# Patient Record
Sex: Male | Born: 1945 | Race: White | Hispanic: No | Marital: Married | Smoking: Former smoker
Health system: Southern US, Community
[De-identification: ages and names within clinical notes are randomized; demographics above are authoritative.]

## PROBLEM LIST (undated history)

## (undated) DIAGNOSIS — I10 Essential (primary) hypertension: Secondary | ICD-10-CM

## (undated) DIAGNOSIS — I471 Supraventricular tachycardia, unspecified: Secondary | ICD-10-CM

## (undated) DIAGNOSIS — E785 Hyperlipidemia, unspecified: Secondary | ICD-10-CM

---

## 2008-05-09 HISTORY — PX: CATARACT EXTRACTION, BILATERAL: SHX1313

## 2020-07-10 ENCOUNTER — Other Ambulatory Visit: Payer: Self-pay

## 2020-07-10 ENCOUNTER — Observation Stay
Admission: EM | Admit: 2020-07-10 | Discharge: 2020-07-11 | Disposition: A | Discharge status: Home / Self Care | Payer: Medicare HMO | Attending: Internal Medicine | Admitting: Internal Medicine

## 2020-07-10 ENCOUNTER — Encounter: Payer: Self-pay | Admitting: Internal Medicine

## 2020-07-10 ENCOUNTER — Observation Stay: Payer: Medicare HMO

## 2020-07-10 DIAGNOSIS — R55 Syncope and collapse: Principal | ICD-10-CM | POA: Insufficient documentation

## 2020-07-10 DIAGNOSIS — D62 Acute posthemorrhagic anemia: Secondary | ICD-10-CM | POA: Diagnosis not present

## 2020-07-10 DIAGNOSIS — Z79899 Other long term (current) drug therapy: Secondary | ICD-10-CM | POA: Insufficient documentation

## 2020-07-10 DIAGNOSIS — E785 Hyperlipidemia, unspecified: Secondary | ICD-10-CM | POA: Diagnosis not present

## 2020-07-10 DIAGNOSIS — K921 Melena: Secondary | ICD-10-CM

## 2020-07-10 DIAGNOSIS — K922 Gastrointestinal hemorrhage, unspecified: Secondary | ICD-10-CM | POA: Insufficient documentation

## 2020-07-10 DIAGNOSIS — D72829 Elevated white blood cell count, unspecified: Secondary | ICD-10-CM | POA: Diagnosis not present

## 2020-07-10 DIAGNOSIS — Z20822 Contact with and (suspected) exposure to covid-19: Secondary | ICD-10-CM | POA: Insufficient documentation

## 2020-07-10 DIAGNOSIS — Z87891 Personal history of nicotine dependence: Secondary | ICD-10-CM | POA: Diagnosis not present

## 2020-07-10 DIAGNOSIS — I10 Essential (primary) hypertension: Secondary | ICD-10-CM | POA: Diagnosis not present

## 2020-07-10 DIAGNOSIS — D696 Thrombocytopenia, unspecified: Secondary | ICD-10-CM

## 2020-07-10 HISTORY — DX: Essential (primary) hypertension: I10

## 2020-07-10 HISTORY — DX: Supraventricular tachycardia, unspecified: I47.10

## 2020-07-10 HISTORY — DX: Hyperlipidemia, unspecified: E78.5

## 2020-07-10 HISTORY — DX: Supraventricular tachycardia: I47.1

## 2020-07-10 LAB — COMPREHENSIVE METABOLIC PANEL
ALT: 17 U/L (ref 0–44)
AST: 14 U/L — ABNORMAL LOW (ref 15–41)
Albumin: 3.2 g/dL — ABNORMAL LOW (ref 3.5–5.0)
Alkaline Phosphatase: 46 U/L (ref 38–126)
Anion gap: 8 (ref 5–15)
BUN: 20 mg/dL (ref 8–23)
CO2: 21 mmol/L — ABNORMAL LOW (ref 22–32)
Calcium: 8.1 mg/dL — ABNORMAL LOW (ref 8.9–10.3)
Chloride: 110 mmol/L (ref 98–111)
Creatinine, Ser: 0.86 mg/dL (ref 0.61–1.24)
GFR, Estimated: >60 mL/min (ref >60)
Glucose, Bld: 176 mg/dL — ABNORMAL HIGH (ref 70–99)
Potassium: 4.1 mmol/L (ref 3.5–5.1)
Sodium: 139 mmol/L (ref 135–145)
Total Bilirubin: 0.7 mg/dL (ref 0.3–1.2)
Total Protein: 5.6 g/dL — ABNORMAL LOW (ref 6.5–8.1)

## 2020-07-10 LAB — CBC WITH DIFFERENTIAL/PLATELET
Abs Immature Granulocytes: 0.07 10*3/uL (ref 0.00–0.07)
Basophils Absolute: 0 10*3/uL (ref 0.0–0.1)
Basophils Relative: 0 %
Eosinophils Absolute: 0 10*3/uL (ref 0.0–0.5)
Eosinophils Relative: 0 %
HCT: 32.2 % — ABNORMAL LOW (ref 39.0–52.0)
Hemoglobin: 10.4 g/dL — ABNORMAL LOW (ref 13.0–17.0)
Immature Granulocytes: 1 %
Lymphocytes Relative: 14 %
Lymphs Abs: 1.8 10*3/uL (ref 0.7–4.0)
MCH: 32.7 pg (ref 26.0–34.0)
MCHC: 32.3 g/dL (ref 30.0–36.0)
MCV: 101.3 fL — ABNORMAL HIGH (ref 80.0–100.0)
Monocytes Absolute: 0.8 10*3/uL (ref 0.1–1.0)
Monocytes Relative: 6 %
Neutro Abs: 9.9 10*3/uL — ABNORMAL HIGH (ref 1.7–7.7)
Neutrophils Relative %: 79 %
Platelets: 160 10*3/uL (ref 150–400)
RBC: 3.18 MIL/uL — ABNORMAL LOW (ref 4.22–5.81)
RDW: 13 % (ref 11.5–15.5)
WBC: 12.6 10*3/uL — ABNORMAL HIGH (ref 4.0–10.5)
nRBC: 0 % (ref 0.0–0.2)

## 2020-07-10 LAB — MAGNESIUM: Magnesium: 1.8 mg/dL (ref 1.7–2.4)

## 2020-07-10 LAB — TYPE AND SCREEN
ABO/RH(D): O POS
Antibody Screen: NEGATIVE

## 2020-07-10 LAB — PROTIME-INR
INR: 1.2 (ref 0.8–1.2)
Prothrombin Time: 14.4 seconds (ref 11.4–15.2)

## 2020-07-10 LAB — TROPONIN I (HIGH SENSITIVITY)
Troponin I (High Sensitivity): 7 ng/L (ref <18)
Troponin I (High Sensitivity): 8 ng/L (ref <18)

## 2020-07-10 LAB — APTT: aPTT: 29 seconds (ref 24–36)

## 2020-07-10 MED ORDER — LACTATED RINGERS IV SOLN: INTRAVENOUS | Status: DC

## 2020-07-10 MED ORDER — ONDANSETRON HCL 4 MG/2ML IJ SOLN: 4.0000 mg | Freq: Four times a day (QID) | INTRAMUSCULAR | Status: DC | PRN

## 2020-07-10 MED ORDER — PEG 3350-KCL-NA BICARB-NACL 420 G PO SOLR
4000.0000 mL | Freq: Once | ORAL | Status: DC
  Filled 2020-07-10: qty 4000

## 2020-07-10 MED ORDER — IOHEXOL 300 MG/ML  SOLN
100.0000 mL | Freq: Once | INTRAMUSCULAR | Status: AC | PRN
  Administered 2020-07-10: 100 mL via INTRAVENOUS

## 2020-07-10 MED ORDER — LACTATED RINGERS IV BOLUS
1000.0000 mL | Freq: Once | INTRAVENOUS | Status: AC
  Administered 2020-07-10: 1000 mL via INTRAVENOUS

## 2020-07-10 NOTE — H&P (Signed)
History and Physical    PLEASE NOTE THAT DRAGON DICTATION SOFTWARE WAS USED IN THE CONSTRUCTION OF THIS NOTE.   Bobby Moreno WNI:[id number] DOB: [birth year] DOA: 07/10/2020  PCP: Zeb Comfort, MD Patient coming from: home   I have personally briefly reviewed patient's old medical records in [place]  Chief Complaint: Hematochezia  HPI: Bobby Moreno is a 75 y.o. male with medical history significant for hypertension, hyperlipidemia, who is admitted to [facility] on 07/10/2020 with acute lower gastrointestinal bleed after presenting from home to [facility] ED complaining of hematochezia.   The patient underwent colonoscopy with removal of several adenomatous polyps on 07/01/2020 via Dr. Leroy Sea, who is within the [facility] medical system.  During the colonoscopy, large polyp at the level of the cecum was observed, and the patient was subsequently scheduled for repeat colonoscopy with endoscopic mucosal resection of this polyp approximately 1 week later.  Correspondingly, he underwent repeat colonoscopy with endoscopic mucosal resection via Dr. Leroy Sea on 07/09/2020, during which portions but not all of the cecal polyp was removed at that time.  No intraoperative complications were reported, and Dr. Leafy Half to repeat colonoscopy in a few weeks to further address residual polyp at the level of the cecum.   Starting this morning, the patient has experienced 4-5 episodes of hematochezia associated with loose stool.  Denies any associated melena.  He reports mild abdominal tenderness in the bilateral lower abdominal quadrants, slightly worse with tenderness over this area.  He also reports intermittent nausea resulting in 1 episode of nonbloody, nonbilious emesis.  He conveys that his abdominal pain subsequently resolved, without any ensuing recurrence.  After the fourth episode of hematochezia, patient reports that he was sitting in his easy chair on the afternoon of 07/09/2020 at which  time he began to feel dizzy and lightheaded, and ultimately lost consciousness without falling out of his chair.  Episode was reportedly witnessed by the patient's wife, who was able to confirm loss of consciousness lasting less than 30 seconds and in the absence of any associated tonic-clonic activity, tongue biting, or loss of bowel/bladder function.  Not associate with any chest pain, shortness of breath, palpitations.  No preceding trauma.  Not associate with any acute focal weakness, acute focal numbness, paresthesias, dysarthria, facial droop, acute change in vision, dysphagia, or vertigo.   The patient denies taking any blood thinners at home, including no aspirin.  Denies any known underlying liver pathology.  Reports that he typically consumes 1 beer per day, but denies any significant alcohol consumption in excess of this quantity.  No routine use of NSAIDs, oral iron supplementation, oral corticosteroids, or oral potassium supplementation.  No prior history of gastrointestinal bleed.  In the setting of the aforementioned syncopal episode, EMS was contacted and brought the patient to [facility] ED for further evaluation.  The patient denies any recent subjective fever, chills, rigors, or generalized myalgias.  Denies any recent headache, neck stiffness, rhinitis, rhinorrhea, sore throat, wheezing, cough or rash.  No recent traveling or known COVID-19 exposures.  Denies any associated dysuria, gross hematuria, or change in urinary urgency/frequency.     ED Course:  Vital signs in the ED were notable for the following: Temperature max 98, heart rate 84-91; blood pressure 112/68 -122/81; respiratory rate 18-21, oxygen saturation 97 to 99% on room air.  Labs were notable for the following: CMP was notable for the following: Sodium 139, bicarbonate 21, anion gap 8, BUN 20, creatinine 0.86, glucose 176.  CBC  notable for the following: White blood cell count 12,600, hemoglobin 10 with MCV 101,  normochromic findings, and RDW 13, which is relative to the following hemoglobin values identified per review of Care Everywhere: July 2020: Hemoglobin 14, May 2021: Hemoglobin 13.3.  Today's CBC is also notable for platelet count of 160.  INR 1.2.  High-sensitivity troponin I initially noted to be 7, with repeat value found to be 8.  Nasopharyngeal COVID-19 PCR was checked in the ED today, with result currently pending.  EKG showed sinus rhythm with PAC, heart rate 88, normal intervals, and no evidence of T wave or ST changes, including no evidence of ST elevation.  The patient's case was discussed with the on-call gastroenterologist, Dr. Bonna Gains, who recommended stat CT abdomen/pelvis with IV contrast to evaluate for bowel perforation, with plan to initiate Nulytely bowel prep if CT abdomen/pelvis showed no evidence to suggest bowel perforation. Dr. Bonna Gains has ordered colonoscopy to occur in the morning, and requested that Nulytely bowel prep be complete by 4 AM in order to be compliant with the scheduled timeframe for colonoscopy.   While in the ED, the following were administered: LR bolus x 1 L.       Review of Systems: As per HPI otherwise 10 point review of systems negative.   Past Medical History:  Diagnosis Date  . HLD (hyperlipidemia)   . HTN (hypertension)   . PSVT (paroxysmal supraventricular tachycardia) ([place])     Past Surgical History:  Procedure Laterality Date  . CATARACT EXTRACTION, BILATERAL  2010    Social History:  reports that he has quit smoking. He has never used smokeless tobacco. He reports current alcohol use of about 7.0 standard drinks of alcohol per week. He reports that he does not use drugs.   No Known Allergies  Family History  Problem Relation Age of Onset  . Dementia Mother   . Prostate cancer Father   . Emphysema Father      Home Medications: Outpatient medications include Toprol-XL, atorvastatin, oral vitamin B12, as needed oxycodone,  and perindopril.     Objective    Physical Exam: Vitals:   07/10/20 1910 07/10/20 1911 07/10/20 1930 07/10/20 2030  BP: 113/66  112/68 122/81  Pulse: 84  91 90  Resp: 18  18 (!) 21  Temp: 98 F (36.7 C)     TempSrc: Oral     SpO2: 96%  97% 99%  Weight:  81.6 kg    Height:  5\' 9"  (1.753 m)      General: appears to be stated age; alert, oriented Skin: warm, dry, no rash Head:  AT/[place] Mouth:  Oral mucosa membranes appear dry, normal dentition Neck: supple; trachea midline Heart:  RRR; did not appreciate any M/R/G Lungs: CTAB, did not appreciate any wheezes, rales, or rhonchi Abdomen: + BS; soft, ND, NT Vascular: 2+ pedal pulses b/l; 2+ radial pulses b/l Extremities: no peripheral edema, no muscle wasting Neuro: strength and sensation intact in upper and lower extremities b/l    Labs on Admission: I have personally reviewed following labs and imaging studies  CBC: Recent Labs  Lab 07/10/20 1929  WBC 12.6*  NEUTROABS 9.9*  HGB 10.4*  HCT 32.2*  MCV 101.3*  PLT 073   Basic Metabolic Panel: Recent Labs  Lab 07/10/20 1929  NA 139  K 4.1  CL 110  CO2 21*  GLUCOSE 176*  BUN 20  CREATININE 0.86  CALCIUM 8.1*   GFR: Estimated Creatinine Clearance: 77.6 mL/min (by C-G  formula based on SCr of 0.86 mg/dL). Liver Function Tests: Recent Labs  Lab 07/10/20 1929  AST 14*  ALT 17  ALKPHOS 46  BILITOT 0.7  PROT 5.6*  ALBUMIN 3.2*   No results for input(s): LIPASE, AMYLASE in the last 168 hours. No results for input(s): AMMONIA in the last 168 hours. Coagulation Profile: Recent Labs  Lab 07/10/20 1929  INR 1.2   Cardiac Enzymes: No results for input(s): CKTOTAL, CKMB, CKMBINDEX, TROPONINI in the last 168 hours. BNP (last 3 results) No results for input(s): PROBNP in the last 8760 hours. HbA1C: No results for input(s): HGBA1C in the last 72 hours. CBG: No results for input(s): GLUCAP in the last 168 hours. Lipid Profile: No results for input(s):  CHOL, HDL, LDLCALC, TRIG, CHOLHDL, LDLDIRECT in the last 72 hours. Thyroid Function Tests: No results for input(s): TSH, T4TOTAL, FREET4, T3FREE, THYROIDAB in the last 72 hours. Anemia Panel: No results for input(s): VITAMINB12, FOLATE, FERRITIN, TIBC, IRON, RETICCTPCT in the last 72 hours. Urine analysis: No results found for: COLORURINE, APPEARANCEUR, LABSPEC, PHURINE, GLUCOSEU, HGBUR, BILIRUBINUR, KETONESUR, PROTEINUR, UROBILINOGEN, NITRITE, LEUKOCYTESUR  Radiological Exams on Admission: No results found.   EKG: Independently reviewed, with result as described above.    Assessment/Plan   Bobby Moreno is a 75 y.o. male with medical history significant for hypertension, hyperlipidemia, who is admitted to [facility] on 07/10/2020 with acute lower gastrointestinal bleed after presenting from home to [facility] ED complaining of hematochezia.   Principal Problem:   Acute lower GI bleeding Active Problems:   Acute blood loss anemia   HTN (hypertension)   HLD (hyperlipidemia)   Syncope   Leukocytosis    #) Acute lower GI bleed: diagnosis on the basis of 4-5 episodes of hematochezia over the course of the last day, with most recent such episode occurring approximately 1 hour prior to presenting to [facility] ED.  This is in the context of colonoscopy with endoscopic mucosal resection of the portion of a large cecal polyp that was performed by Dr. Leroy Sea within the [facility] system yesterday, raising possibility of periprocedural contributions to presenting acute lower GI bleed. A non-elevated presenting BUN further substantiates suspicion of a lower GI source. Patient appears hemodynamically stable with normotensive/non-tachycardic vital signs thus far, although he was noted with a single episode of syncope at home earlier today.  Not on any blood thinners as an outpatient, including no aspirin.  Reports typical consumption of 1 beer per day, but denies any history of known underlying  liver disease to warrant SBP prophylaxis.  Presentation appears to be associated with acute blood loss anemia, as further described below, with presenting Hgb of 10.4 relative to baseline hemoglobin of 13-14 per review of [facility] records, although this range was established approximately 8 months ago without interval hgb data points.  INR noted to be 1.2.  Given suspected lower GI source, there does not appear to be indication for initiation of Protonix drip.  The patient was typed and screened in the ED.   The patient's case was discussed with the on-call gastroenterologist, Dr. Bonna Gains, who recommended stat CT abdomen/pelvis with IV contrast to evaluate for bowel perforation, with plan to initiate Nulytely bowel prep if CT abdomen/pelvis showed no evidence to suggest bowel perforation. Dr. Bonna Gains has ordered colonoscopy to occur in the morning, and requested that Nulytely bowel prep be complete by 4 AM in order to be compliant with the scheduled timeframe for colonoscopy.      Plan: NPO. Refraining  from pharmacologic DVT prophylaxis. scd's Monitor on telemetry. Monitor continuous pulse-ox. Maintain at least 2 large bore IV's. Check INR in the AM. Q4H H&H's have been ordered through 9 AM on 07/11/20. Will closely monitor these ensuing Hgb levels and correlate these data points with the patient's overall clinical picture including vital signs to determine need for prbc transfusion.  Repeat CBC with morning labs.  Stat CT abdomen/pelvis with IV contrast for evaluation of bowel perforation, as above, with plan to initiate Nulytely bowel prep should CT abdomen/pelvis demonstrate evidence of bowel perforation.  This plan was communicated to the patient's nurse, along with the need to complete the bowel prep by 4 AM.  Repeat CMP in the morning.     #) Acute blood loss anemia: in the setting of suspected acute lower GI bleed, as above, presenting Hgb noted to be 10.4 relative to baseline hemoglobin of 13-14 per  review of [facility] records, although this range was established approximately 8 months ago without interval hgb data points to establish a more accurate recent trend. At this time, patient appears hemodynamically stable and asymptomatic, as further described above.  Presenting hemoglobin was associated with macrocytic in hypochromic findings.  Interestingly, RDW found to be nonelevated, with this lack of elevation potentially on the basis of insufficient elapsed time following initial episode of hematochezia on the morning of  07/11/2018 .  As above, the patient has been typed and screened.    Plan: work-up and management for presenting suspected acute lower GI bleed, as above, including close monitoring of Q4H H&H's, with clinical evaluation for determination of need for blood transfusion, as further described above. Monitor on telemetry. Monitor continuous pulse-ox. NPO. Refraining from pharmacologic DVT prophylaxis. Check INR in the morning.  Gastroenterology formally consulted, with plan to proceed with colonoscopy in the morning following interval Nulytely bowel prep so long as CT abdomen/pelvis with IV contrast does not demonstrate evidence of bowel perforation, as further detailed above.  Add on the following to pretransfusion labs collected in the ED today: Total iron, TIBC, ferritin, MMA, folic acid level, reticulocyte count.         #) Syncope: 1 episodes of syncope earlier today in the setting of prodrome, with differential favoring orthostatic hypotension in the setting of dehydration due to acute lower gastrointestinal bleed versus contribution from vasovagal response as a result of associated mild abdominal discomfort.  Not associate with any overt acute focal neurologic deficits.  Clinically, acute ischemic stroke versus seizures previously this time.  Presentation was consistent with ACS, serial troponin found to be nonelevated, with presenting EKG shows no evidence of acute CVA changes.  In  the setting of acute blood loss anemia, will refrain from the atypical checking of orthostatic vital signs so as to not induce another syncopal episode.  There may also be a secondary pharmacologic factor contributing to patient's syncopal episode in the context of home beta-blocker and ACE inhibitor with associated inhibition of compensatory tachycardia and peripheral vasoconstriction in the setting of intravascular depletion and associated orthostatis.      Plan: Initiate gentle IV fluids in the form of lactated Ringer's at 50 cc/h.  Monitor on telemetry.  Hold on beta-blocker and ACE inhibitor for now.  Monitor strict I's and O's and daily weights.  Evaluation and management of presenting acute lower gastrointestinal bleed, including serial H&H trending as well as plan for colonoscopy in the morning, as further detailed above.  Repeat serum troponin level in the morning, although ACS clinically appears  much less likely relative to the above possibilities.      #) Leukocytosis: Presenting CBC reflects mild leukocytosis of 12,600.  Suspect potential multifactorial contributions from inflammatory element in the context of yesterday's colonoscopy with mucosal resection versus contribution from hemoconcentration in the context of intravascular depletion due to interval episodes of hematochezia and associated acute blood loss anemia, as further detailed above.  Overall, no evidence to suggest underlying infectious process at this time.  Of note, screening nasopharyngeal COVID-19 PCR was performed today, with result currently pending.  Also check urinalysis to further evaluate for any underlying infectious process.  Plan: Follow-up result of screening nasopharyngeal COVID-19 PCR performed today.  Check urinalysis P repeat CBC with differential in the morning.      #) Hyperlipidemia: Per review of [facility] records within Care Everywhere, the patient is on daily atorvastatin at home.  Plan: We will hold  home atorvastatin for now in the setting of current n.p.o. status.     #) Essential hypertension: Outpatient antihypertensive regimen, per review of [facility] records via Care Everywhere, consists of Toprol-XL as well as ACE inhibitor.  In the setting of presenting acute lower gastrointestinal bleed and ensuing acute blood loss anemia, will hold home beta-blocker and ACE inhibitor for now.  Plan: Hold home beta-blocker and ACE inhibitor for now.  Further evaluation management presenting acute lower gastrointestinal bleed and associated acute blood loss anemia, as further detailed above.  Close monitoring of ensuing blood pressure via routine vital signs.     DVT prophylaxis: scd's  Code Status: Full code Family Communication: I discussed the patient's case with his wife, who was present at bedside. Disposition Plan: Per Rounding Team Consults called: Case discussed with the on-call gastroenterologist, Dr. Bonna Gains, as further described above. Admission status: Observation, med telemetry.     Of note, this patient was added by me to the following Admit List/Treatment Team: armcadmits.      PLEASE NOTE THAT DRAGON DICTATION SOFTWARE WAS USED IN THE CONSTRUCTION OF THIS NOTE.   [facility] Pager [phone] From 12PM - 12AM  Otherwise, please contact night-coverage  [email or web address] Password [facility]   07/10/2020, 11:17 PM

## 2020-07-10 NOTE — ED Provider Notes (Signed)
[facility] Emergency Department Provider Note ____________________________________________   Event Date/Time   First MD Initiated Contact with Patient 07/10/20 1908     (approximate)  I have reviewed the triage vital signs and the nursing notes.  HISTORY  Chief Complaint Loss of Consciousness   HPI Bobby Moreno is a 75 y.o. malewho presents to the ED for evaluation of syncope and hematochezia.   Chart review indicates no hx within our system.  Patient reports that all of his medical care is through [place].  Reports history of HTN, HLD.  Takes no blood thinners and no cardiac history.  Recently moved to the [place] from [place] about 6 years ago.  Had his first colonoscopy 2 weeks ago, noted to have about 10 polyps, they removed 8 and he followed up again yesterday for second colonoscopy to remove what he describes as a large and out of reach polyp.  Patient reports a prolonged procedure yesterday morning and the GI specialist had difficulty successfully removing either of these 2 remaining polyps.  He reports being told he would have to follow-up again for another attempt.    Patient reports feeling generalized weakness yesterday, some nausea and poor p.o. intake.  Starting this morning, patient reports about 4-5 episodes of hematochezia.  He reports a sensation of needing to stool, having loose and watery stools with associated bright red blood without melena.  Reports a single episode of emesis today that was nonbloody nonbilious.  Patient reports a syncopal episode that occurred while seated today while he was listening to music, reports preceding lightheaded dizziness and diaphoresis.  Aside from the sensation of needing to stool, he denies any significant abdominal pain, chest pain, fever, dysuria.   History reviewed. No pertinent past medical history.  Patient Active Problem List   Diagnosis Date Noted  . Acute lower GI bleeding 07/10/2020    Prior to  Admission medications   Not on File    Allergies Patient has no known allergies.  No family history on file.  Social History    Review of Systems  Constitutional: No fever/chills Eyes: No visual changes. ENT: No sore throat. Cardiovascular: Denies chest pain. Respiratory: Denies shortness of breath. Gastrointestinal: No abdominal pain.  No constipation. Positive for nausea, emesis, hematochezia and diarrhea. Genitourinary: Negative for dysuria. Musculoskeletal: Negative for back pain. Skin: Negative for rash. Neurological: Negative for headaches, focal weakness or numbness.  Positive for syncopal episode.  ____________________________________________   PHYSICAL EXAM:  VITAL SIGNS: Vitals:   07/10/20 1930 07/10/20 2030  BP: 112/68 122/81  Pulse: 91 90  Resp: 18 (!) 21  Temp:    SpO2: 97% 99%     Constitutional: Alert and oriented. Well appearing and in no acute distress. Eyes: Conjunctivae are normal. PERRL. EOMI. Head: Atraumatic. Nose: No congestion/rhinnorhea. Mouth/Throat: Mucous membranes are moist.  Oropharynx non-erythematous. Neck: No stridor. No cervical spine tenderness to palpation. Cardiovascular: Normal rate, regular rhythm. Grossly normal heart sounds.  Good peripheral circulation. Respiratory: Normal respiratory effort.  No retractions. Lungs CTAB. Gastrointestinal: Soft , nondistended. No CVA tenderness. Mild and poorly localizing lower quadrant tenderness to palpation, primarily to the RLQ.  No peritoneal features.  Benign upper abdomen. Musculoskeletal: No lower extremity tenderness nor edema.  No joint effusions. No signs of acute trauma. Neurologic:  Normal speech and language. No gross focal neurologic deficits are appreciated. No gait instability noted. Skin:  Skin is warm, dry and intact. No rash noted. Psychiatric: Mood and affect are normal. Speech and  behavior are normal.  ____________________________________________   LABS (all labs  ordered are listed, but only abnormal results are displayed)  Labs Reviewed  COMPREHENSIVE METABOLIC PANEL - Abnormal; Notable for the following components:      Result Value   CO2 21 (*)    Glucose, Bld 176 (*)    Calcium 8.1 (*)    Total Protein 5.6 (*)    Albumin 3.2 (*)    AST 14 (*)    All other components within normal limits  CBC WITH DIFFERENTIAL/PLATELET - Abnormal; Notable for the following components:   WBC 12.6 (*)    RBC 3.18 (*)    Hemoglobin 10.4 (*)    HCT 32.2 (*)    MCV 101.3 (*)    Neutro Abs 9.9 (*)    All other components within normal limits  SARS CORONAVIRUS 2 (TAT 6-24 HRS)  PROTIME-INR  APTT  TYPE AND SCREEN  TYPE AND SCREEN  TYPE AND SCREEN  TROPONIN I (HIGH SENSITIVITY)  TROPONIN I (HIGH SENSITIVITY)   ____________________________________________  12 Lead EKG  Sinus rhythm, rate of 88 bpm.  Normal axis and intervals.  No evidence of acute ischemia.  Single PAC. ____________________________________________  RADIOLOGY  ED MD interpretation:    Official radiology report(s): No results found.  ____________________________________________   PROCEDURES and INTERVENTIONS  Procedure(s) performed (including Critical Care):  .1-3 Lead EKG Interpretation Performed by: Vladimir Crofts, MD Authorized by: Vladimir Crofts, MD     Interpretation: normal     ECG rate:  88   ECG rate assessment: normal     Rhythm: sinus rhythm     Ectopy: none     Conduction: normal      Medications  polyethylene glycol-electrolytes (NuLYTELY) solution 4,000 mL (has no administration in time range)  lactated ringers bolus 1,000 mL (0 mLs Intravenous Stopped 07/10/20 2250)  iohexol (OMNIPAQUE) 300 MG/ML solution 100 mL (100 mLs Intravenous Contrast Given 07/10/20 2327)    ____________________________________________   MDM / ED COURSE   75 year old male s/p polypectomy presents to the ED with evidence of postoperative bleeding requiring admission for repeat  colonoscopy.  Normal vitals here in the ED.  Exam with minimal RLQ tenderness to palpation without peritoneal features, and he otherwise looks well.  He has no neurovascular deficits, signs of trauma or any distress.  Blood work demonstrates a hemoglobin of 10 without recent comparison, and likely a slight drop.  His EKG is nonischemic without evidence of interval changes to suggest a cardiac etiology of syncope.  He remains normotensive here in the ED without vital sign abnormalities.  I discussed the case with GI, who initially recommends a nuclear study to assess for GI bleeding, but it does not look like we can do this study overnight and she therefore follows up with recommendation for CT which is pending at time of admission to medicine.  Plans to perform colonoscopy in the morning to assess for continued bleeding from this large polyp.  Admitted to medicine for further work-up and management.  Clinical Course as of 07/10/20 2328  Ludwig Clarks Jul 10, 2020  2003 Care everywhere now links up and I can see some notes from [place].  I review procedure note from colonoscopy yesterday.  1 polyp in the cecum that is greater than 5 cm in size had an incomplete resection.  Additional large polyp in the ascending colon without resection attempt.  He was advised to have a repeat colonoscopy in 3 months for continued piecemeal polypectomy. [DS]  2011 I speak with Dr. Bonna Gains, and review workup and presentation.  RBC scan, if positive call vascular.  If negative, prep for colonoscopy in the AM [DS]  2039 Educated patient, and wife who is now at the bedside, of these developments and need for nuclear imaging.  We discussed admission to the hospital regardless of these results. [DS]  2252 Dr. Bonna Gains now messaging the RN and I to perform a CT instead of NM study. She plans to perform colonoscopy tomorrow AM [DS]  2327 I discussed the case with hospitalist medicine and updated them on my conversations with GI.  We  discussed the pending CT abdomen/pelvis, and Dr. Velia Meyer agrees to follow-up in the study [DS]    Clinical Course User Index [DS] Vladimir Crofts, MD    ____________________________________________   FINAL CLINICAL IMPRESSION(S) / ED DIAGNOSES  Final diagnoses:  Hematochezia  Syncope and collapse     ED Discharge Orders    None       Jalie Eiland Tamala Julian   Note:  This document was prepared using Dragon voice recognition software and may include unintentional dictation errors.   Vladimir Crofts, MD 07/10/20 [phone]

## 2020-07-10 NOTE — ED Triage Notes (Signed)
EMS called to house for dizziness. Pt had syncopal episode witness by EMS. Pt reports recent colonoscopy, reports seeing increasing blood in stool today.

## 2020-07-11 ENCOUNTER — Encounter
Admission: EM | Disposition: A | Discharge status: Home / Self Care | Payer: Self-pay | Source: Home / Self Care | Attending: Emergency Medicine

## 2020-07-11 DIAGNOSIS — R55 Syncope and collapse: Secondary | ICD-10-CM | POA: Diagnosis present

## 2020-07-11 DIAGNOSIS — D696 Thrombocytopenia, unspecified: Secondary | ICD-10-CM

## 2020-07-11 DIAGNOSIS — D72829 Elevated white blood cell count, unspecified: Secondary | ICD-10-CM | POA: Diagnosis not present

## 2020-07-11 DIAGNOSIS — E785 Hyperlipidemia, unspecified: Secondary | ICD-10-CM | POA: Diagnosis present

## 2020-07-11 DIAGNOSIS — K921 Melena: Secondary | ICD-10-CM

## 2020-07-11 DIAGNOSIS — D62 Acute posthemorrhagic anemia: Secondary | ICD-10-CM | POA: Diagnosis not present

## 2020-07-11 DIAGNOSIS — K561 Intussusception: Secondary | ICD-10-CM | POA: Diagnosis not present

## 2020-07-11 DIAGNOSIS — I10 Essential (primary) hypertension: Secondary | ICD-10-CM | POA: Diagnosis present

## 2020-07-11 LAB — URINALYSIS, ROUTINE W REFLEX MICROSCOPIC
Bilirubin Urine: NEGATIVE
Glucose, UA: NEGATIVE mg/dL
Hgb urine dipstick: NEGATIVE
Ketones, ur: 5 mg/dL — AB
Leukocytes,Ua: NEGATIVE
Nitrite: NEGATIVE
Protein, ur: NEGATIVE mg/dL
Specific Gravity, Urine: >1.046 — ABNORMAL HIGH (ref 1.005–1.030)
pH: 5 (ref 5.0–8.0)

## 2020-07-11 LAB — HEMOGLOBIN AND HEMATOCRIT, BLOOD
HCT: 29.1 % — ABNORMAL LOW (ref 39.0–52.0)
HCT: 29.1 % — ABNORMAL LOW (ref 39.0–52.0)
Hemoglobin: 9.5 g/dL — ABNORMAL LOW (ref 13.0–17.0)
Hemoglobin: 9.8 g/dL — ABNORMAL LOW (ref 13.0–17.0)

## 2020-07-11 LAB — IRON AND TIBC
Iron: 15 ug/dL — ABNORMAL LOW (ref 45–182)
Iron: 6 ug/dL — ABNORMAL LOW (ref 45–182)
Saturation Ratios: 6 % — ABNORMAL LOW (ref 17.9–39.5)
TIBC: 255 ug/dL (ref 250–450)
UIBC: 240 ug/dL

## 2020-07-11 LAB — COMPREHENSIVE METABOLIC PANEL
ALT: 14 U/L (ref 0–44)
AST: 12 U/L — ABNORMAL LOW (ref 15–41)
Albumin: 3.1 g/dL — ABNORMAL LOW (ref 3.5–5.0)
Alkaline Phosphatase: 41 U/L (ref 38–126)
Anion gap: 5 (ref 5–15)
BUN: 16 mg/dL (ref 8–23)
CO2: 26 mmol/L (ref 22–32)
Calcium: 8.1 mg/dL — ABNORMAL LOW (ref 8.9–10.3)
Chloride: 110 mmol/L (ref 98–111)
Creatinine, Ser: 0.7 mg/dL (ref 0.61–1.24)
GFR, Estimated: >60 mL/min (ref >60)
Glucose, Bld: 119 mg/dL — ABNORMAL HIGH (ref 70–99)
Potassium: 4.3 mmol/L (ref 3.5–5.1)
Sodium: 141 mmol/L (ref 135–145)
Total Bilirubin: 0.9 mg/dL (ref 0.3–1.2)
Total Protein: 5.6 g/dL — ABNORMAL LOW (ref 6.5–8.1)

## 2020-07-11 LAB — CBC
HCT: 29 % — ABNORMAL LOW (ref 39.0–52.0)
Hemoglobin: 9.9 g/dL — ABNORMAL LOW (ref 13.0–17.0)
MCH: 33.8 pg (ref 26.0–34.0)
MCHC: 34.1 g/dL (ref 30.0–36.0)
MCV: 99 fL (ref 80.0–100.0)
Platelets: 146 10*3/uL — ABNORMAL LOW (ref 150–400)
RBC: 2.93 MIL/uL — ABNORMAL LOW (ref 4.22–5.81)
RDW: 12.9 % (ref 11.5–15.5)
WBC: 7.9 10*3/uL (ref 4.0–10.5)
nRBC: 0.3 % — ABNORMAL HIGH (ref 0.0–0.2)

## 2020-07-11 LAB — FERRITIN
Ferritin: 66 ng/mL (ref 24–336)
Ferritin: 59 ng/mL (ref 24–336)

## 2020-07-11 LAB — PHOSPHORUS: Phosphorus: 3.2 mg/dL (ref 2.5–4.6)

## 2020-07-11 LAB — FOLATE
Folate: 12 ng/mL (ref >5.9)
Folate: 14.5 ng/mL (ref >5.9)

## 2020-07-11 LAB — RETICULOCYTES
Immature Retic Fract: 10.4 % (ref 2.3–15.9)
RBC.: 3.12 MIL/uL — ABNORMAL LOW (ref 4.22–5.81)
Retic Count, Absolute: 46.8 10*3/uL (ref 19.0–186.0)
Retic Ct Pct: 1.5 % (ref 0.4–3.1)

## 2020-07-11 LAB — PROTIME-INR
INR: 1.1 (ref 0.8–1.2)
Prothrombin Time: 14.2 seconds (ref 11.4–15.2)

## 2020-07-11 LAB — MAGNESIUM: Magnesium: 1.9 mg/dL (ref 1.7–2.4)

## 2020-07-11 LAB — TROPONIN I (HIGH SENSITIVITY): Troponin I (High Sensitivity): 8 ng/L (ref <18)

## 2020-07-11 LAB — SARS CORONAVIRUS 2 (TAT 6-24 HRS): SARS Coronavirus 2: NEGATIVE

## 2020-07-11 SURGERY — COLONOSCOPY
Anesthesia: General

## 2020-07-11 NOTE — Consult Note (Signed)
Bobby Antigua, MD [address] [place], [place], [place], [address] [address], [place], [place], [place], [address] Phone: [phone]  Fax: [phone]  Consultation  Referring Provider:     Dr. Vladimir Crofts Primary Care Physician:  Zeb Comfort, MD Reason for Consultation:     Hematochezia  Date of Admission:  07/10/2020 Date of Consultation:  07/11/2020         HPI:   Bobby Moreno is a 75 y.o. male who underwent colonoscopy at [facility] 2 days ago, 07/09/2020, with a large cecal polyp partially removed via EMR, and another large ascending colon polyp not removed.  Patient states he did not feel well after the procedure and had low appetite and felt dehydrated.  The day after the procedure, he reports having 4-5 episodes of hematochezia and dizziness.  Last episode of this was 5 PM yesterday evening.  He came into the ER due to the symptoms.  Patient denies any nausea vomiting with these symptoms.  Patient has not had any bowel movement since presentation to the ER yesterday evening.  Given his abdominal pain that the ER provider noted on presentation and his symptoms, and recent colonoscopy, I recommended CT scan for further evaluation, which showed colocolonic intussusception.  Past Medical History:  Diagnosis Date  . HLD (hyperlipidemia)   . HTN (hypertension)   . PSVT (paroxysmal supraventricular tachycardia) ([place])     Past Surgical History:  Procedure Laterality Date  . CATARACT EXTRACTION, BILATERAL  2010    Prior to Admission medications   Not on File    Family History  Problem Relation Age of Onset  . Dementia Mother   . Prostate cancer Father   . Emphysema Father      Social History   Tobacco Use  . Smoking status: Former Research scientist (life sciences)  . Smokeless tobacco: Never Used  Substance Use Topics  . Alcohol use: Yes    Alcohol/week: 7.0 standard drinks    Types: 7 Cans of beer per week  . Drug use: Never    Allergies as of 07/10/2020  . (No Known  Allergies)    Review of Systems:    All systems reviewed and negative except where noted in HPI.   Physical Exam:  Vital signs in last 24 hours: Vitals:   07/11/20 0130 07/11/20 0200 07/11/20 0228 07/11/20 0726  BP: 119/82 139/90 131/90 120/71  Pulse: 91 98 87 70  Resp: 20 (!) 23 18 20   Temp:   98.2 F (36.8 C) 97.8 F (36.6 C)  TempSrc:   Oral Oral  SpO2: 95% 97% 98% 98%  Weight:   82.8 kg   Height:   5\' 9"  (1.753 m)    Last BM Date: 07/10/20 General:   Pleasant, cooperative in NAD Head:  Normocephalic and atraumatic. Eyes:   No icterus.   Conjunctiva pink. PERRLA. Ears:  Normal auditory acuity. Neck:  Supple; no masses or thyroidomegaly Lungs: Respirations even and unlabored. Lungs clear to auscultation bilaterally.   No wheezes, crackles, or rhonchi.  Abdomen:  Soft, nondistended, nontender. Normal bowel sounds. No appreciable masses or hepatomegaly.  No rebound or guarding.  Neurologic:  Alert and oriented x3;  grossly normal neurologically. Skin:  Intact without significant lesions or rashes. Cervical Nodes:  No significant cervical adenopathy. Psych:  Alert and cooperative. Normal affect.  LAB RESULTS: Recent Labs    07/10/20 1929 07/11/20 0038 07/11/20 0558 07/11/20 0833  WBC 12.6*  --  7.9  --   HGB  10.4* 9.8* 9.9* 9.5*  HCT 32.2* 29.1* 29.0* 29.1*  PLT 160  --  146*  --    BMET Recent Labs    07/10/20 1929 07/11/20 0558  NA 139 141  K 4.1 4.3  CL 110 110  CO2 21* 26  GLUCOSE 176* 119*  BUN 20 16  CREATININE 0.86 0.70  CALCIUM 8.1* 8.1*   LFT Recent Labs    07/11/20 0558  PROT 5.6*  ALBUMIN 3.1*  AST 12*  ALT 14  ALKPHOS 41  BILITOT 0.9   PT/INR Recent Labs    07/10/20 1929 07/11/20 0558  LABPROT 14.4 14.2  INR 1.2 1.1    STUDIES: CT ABDOMEN PELVIS W CONTRAST  Result Date: 07/10/2020 CLINICAL DATA:  Abdominal pain, recent cecal polypectomy, dizziness EXAM: CT ABDOMEN AND PELVIS WITH CONTRAST TECHNIQUE: Multidetector CT imaging  of the abdomen and pelvis was performed using the standard protocol following bolus administration of intravenous contrast. CONTRAST:  121mL OMNIPAQUE IOHEXOL 300 MG/ML  SOLN COMPARISON:  None. FINDINGS: Lower chest: No acute pleural or parenchymal lung disease. Hepatobiliary: Multiple hepatic cysts. No biliary dilation. The gallbladder is unremarkable. Pancreas: Unremarkable. No pancreatic ductal dilatation or surrounding inflammatory changes. Spleen: Normal in size without focal abnormality. Adrenals/Urinary Tract: There are multiple cortical calcifications left kidney with overlying scarring. Punctate less than 2 mm nonobstructing right renal calculi. No obstructive uropathy within either kidney. The adrenals and bladder are unremarkable. Stomach/Bowel: No bowel obstruction or ileus. Normal appendix right lower quadrant. There is marked wall thickening of the cecum, which could be related to recent cecal polypectomy. Small foci of intramural gas best seen on images 51-53 of series 2 are likely related to recent polypectomy. There is a short segment intussusception involving the proximal ascending colon, best seen on coronal image 43 of series 5. No evidence of bowel perforation, fluid collection, or abscess. Vascular/Lymphatic: Aortic atherosclerosis. No enlarged abdominal or pelvic lymph nodes. Reproductive: Prostate is unremarkable. Other: There is mesenteric edema surrounding the cecum, without free fluid or free gas within the peritoneal cavity. No abdominal wall hernia. Musculoskeletal: Chronic L2 compression fracture. No acute bony abnormalities. Reconstructed images demonstrate no additional findings. IMPRESSION: 1. Marked wall thickening of the cecum and proximal ascending colon, with focal area of intramural gas in the cecum likely related to recent polypectomy. No evidence of perforation. 2. Short segment colo-colic intussusception involving the proximal ascending colon. No evidence of obstruction. 3.  Punctate nonobstructing right renal calculi. 4. Left renal cortical scarring with cortical calcifications. 5.  Aortic Atherosclerosis (ICD10-I70.0). Electronically Signed   By: Randa Ngo M.D.   On: 07/10/2020 23:47      Impression / Plan:   Majour Frei is a 75 y.o. y/o male with presentation for hematochezia post colonoscopy with EMR for removal of large cecal polyp the day prior to that   Patient is hematochezia has completely resolved and he has not had a bowel movement over 16 hours He is hemodynamically stable His abdominal pain and discomfort have completely resolved as well since receiving IV fluids in the ER  Patient may have had a post polypectomy bleed given that a large polyp, 50 mm in size, was partially removed during that procedure  The bleeding appears to have stopped at this time, and given his colocolonic intussusception noted on CT scan last night, would not recommend colonoscopy at this time unless rebleeding occurs  Surgery has evaluated the patient in regard to his intussusception recommended conservative management  Would recommend close  monitoring with repeat CBC as necessary  Colocolonic intussusception likely occurred due to presence of another large polyp in the ascending colon that was unable to be removed during the colonoscopy and is planning on being removed in June at [facility]  If patient remains asymptomatic with no recurrent bleeding or worsening anemia during this admission, patient should have close follow-up scheduled with his primary gastroenterologist at [facility], within 1 to 2 weeks of discharge  will continue to follow   Thank you for involving me in the care of this patient.      LOS: 0 days   Virgel Manifold, MD  07/11/2020, 9:48 AM

## 2020-07-11 NOTE — Care Plan (Signed)
Pt given AVS at this time, IV removed. Pt denies questions. Pt educated about following up with PCP.

## 2020-07-11 NOTE — Progress Notes (Signed)
Update: CT abdomen/pelvis with IV contrast has been completed with result showing no evidence of perforation while showing short segment of colo-coli intussusception involving the proximal ascending colon in absence of associated evidence of obstruction.  The patient has experienced no episodes of hematochezia since presenting to the ED and is currently asymptomatic while appearing hemodynamically stable.  Further discussed the results of the CT abdomen/pelvis with the on-call gastroenterologist, Dr. Enriqueta Shutter, who recommends holding off on colonoscopy in the morning due to interval resolution of overt evidence of acute lower GI bleed.  Correspondingly, the bowel prep has been canceled.  Per these updated discussions with Dr. Enriqueta Shutter, if patient exhibits subsequent evidence of active bleed later this evening, will plan for covering Hospitalist to call nuclear medicine for stat RBC scan at the time of the bleed. Additionally, in the setting of CT evidence of intussusception, I have placed consult with the on-call general surgeon, Dr. Lysle Pearl.       Babs Bertin, DO Hospitalist

## 2020-07-11 NOTE — Discharge Instructions (Signed)
https://www.ncbi.nlm.nih.gov/books/NBK537291/#:~:text=Interventional%20Radiology-,Deterrence%20and%20Patient%20Education,also%20known%20as%20the%20colon).">  Lower Gastrointestinal Bleeding  Lower gastrointestinal (GI) bleeding is the result of bleeding from the colon, the rectum, or the area of the opening of the buttocks (anal area). The colon is the last part of the digestive tract, where stool (feces) is formed. A person with lower GI bleeding may see blood in or on his or her stool. The blood may be bright red. Lower GI bleeding often stops without treatment. Continued or heavy bleeding may be life-threatening and needs emergency treatment at a hospital. What are the causes? This condition may be caused by:  Diverticulosis. This condition causes pouches to form in your colon over time.  Diverticulitis. This is inflammation in areas where diverticulosis has occurred.  Inflammatory bowel disease (IBD), or inflammation of the colon.  Hemorrhoids, or swollen veins in the rectum.  Anal fissures, or painful tears in the anus. Tears are often caused by passing hard stools.  Cancer of the colon or rectum, or polyps of the colon or rectum. Polyps are noncancerous growths.  Coagulopathy. This is a disorder that makes it hard to form blood clots and causes easy bleeding.  Arteriovenous malformation. This is an abnormal weakening of a blood vessel where an artery and a vein come together. What increases the risk? The following factors may make you more likely to develop this condition:  Being older than age 39.  Regular use of aspirin or NSAIDs, such as ibuprofen.  Taking blood thinners (anticoagulants) or anti-platelet medicines.  Having a history of high-dose X-ray treatment (radiation therapy) of the colon.  Having recently had a colon polyp removed.  Heavy use of alcohol, or use of nicotine products. What are the signs or symptoms? Symptoms of this condition include:  Bright red  blood or blood clots coming from your rectum.  Bloody stools.  Black or maroon-colored stools.  Pain or cramping in your abdomen.  Weakness or dizziness.  Racing heartbeat. How is this diagnosed? This condition may be diagnosed based on:  Your symptoms and medical history.  A physical exam. During the exam, your health care provider will check for signs of blood loss, such as low blood pressure and a fast pulse.  Tests or procedures. These may include: ? Flexible sigmoidoscopy. In this procedure, a flexible tube with a camera on the end is used to examine your anus and the first part of your colon to look for the source of bleeding. ? Colonoscopy. This is similar to a flexible sigmoidoscopy, but the camera can extend all the way to the uppermost part of your colon. ? Blood tests to measure your red blood cell count and to check for coagulopathy. ? Angiogram. For this test, X-rays of your colon are taken after a dye or radioactive substance is injected into your bloodstream. This may be done to look for a bleeding site. How is this treated? Treatment for this condition depends on the cause of your bleeding. Heavy or ongoing bleeding is treated at a hospital. Treatment may include:  Getting fluids through an IV inserted into one of your veins.  Getting blood through an IV (blood transfusion).  Stopping bleeding with high heat (coagulation), injections of certain medicines, or surgical clips. This can all be done during a colonoscopy.  Having a procedure that involves first doing an angiogram and then blocking blood flow to the bleeding site (embolization).  Stopping some of your regular medicines for a certain amount of time.  Having surgery to remove part of your colon. This may  be needed if bleeding is severe and does not lessen after other treatment. Follow these instructions at home: Eating and drinking  Eat foods that are high in fiber. This will help keep your stools soft.  These foods include whole grains, legumes, fruits, and vegetables. Eating 1-3 prunes a day works well for many people.  Drink enough fluid to keep your urine pale yellow.  Do not drink alcohol.   General instructions  Do not use any products that contain nicotine or tobacco, such as cigarettes, e-cigarettes, and chewing tobacco. If you need help quitting, ask your health care provider.  Take over-the-counter and prescription medicines only as told by your health care provider. You may need to avoid aspirin, NSAIDs, or other medicines that increase bleeding.  Return to your normal activities as told by your health care provider. Ask your health care provider what activities are safe for you.  Keep all follow-up visits as told by your health care provider. This is important.   Contact a health care provider if:  Your symptoms do not improve.  You feel nauseous, or you vomit.  You have pain in your abdomen.  You feel weak or dizzy.  You need help to stop smoking or drinking alcohol. Get help right away if:  Your bleeding increases.  You have severe weakness or you faint.  You have sudden or severe cramps in your back or abdomen.  You vomit blood.  You pass large blood clots in your stool.  Any of your other symptoms get worse. These symptoms may represent a serious problem that is an emergency. Do not wait to see if the symptoms will go away. Get medical help right away. Call your local emergency services (911 in the [place] Do not drive yourself to the hospital. Summary  If you have lower gastrointestinal (GI) bleeding, you may see blood in or on your stool (feces). The blood may be bright red.  Take over-the-counter and prescription medicines only as told by your health care provider. You may need to avoid aspirin, NSAIDs, or other medicines that increase bleeding.  Keep all follow-up visits as told by your health care provider. This is important.  Get help right away if  your symptoms get worse. This information is not intended to replace advice given to you by your health care provider. Make sure you discuss any questions you have with your health care provider. Document Revised: 04/20/2019 Document Reviewed: 04/20/2019 Elsevier Patient Education  2021 [employer].

## 2020-07-11 NOTE — Consult Note (Addendum)
Subjective:   CC: Lower GI bleed  HPI:  Bobby Moreno is a 75 y.o. male who was consulted by [facility] for issue above.  Symptoms were first noted 1 day ago.  Status post colonoscopy with mucosal resection for large polyp.  Patient started developing some new pain few hours after procedure as well as bleeding described as blood mixed with stool, not to the extent of filling up the toilet bowl with blood.  He did however have an episode of what sounds like syncope that was witnessed by his wife, therefore brought to the ED for further evaluation.  He had about 8 episodes of hematochezia that has since resolved for the past 8 hours or so.  Currently he denies any pain or other symptoms.   Past Medical History:  has a past medical history of HLD (hyperlipidemia), HTN (hypertension), and PSVT (paroxysmal supraventricular tachycardia) ([place]).  Past Surgical History:  Past Surgical History:  Procedure Laterality Date  . CATARACT EXTRACTION, BILATERAL  2010    Family History: family history includes Dementia in his mother; Emphysema in his father; Prostate cancer in his father.  Social History:  reports that he has quit smoking. He has never used smokeless tobacco. He reports current alcohol use of about 7.0 standard drinks of alcohol per week. He reports that he does not use drugs.  Current Medications:  Prior to Admission medications   Not on File    Allergies:  Allergies as of 07/10/2020  . (No Known Allergies)    ROS:  General: Denies weight loss, weight gain, fatigue, fevers, chills, and night sweats. Eyes: Denies blurry vision, double vision, eye pain, itchy eyes, and tearing. Ears: Denies hearing loss, earache, and ringing in ears. Nose: Denies sinus pain, congestion, infections, runny nose, and nosebleeds. Mouth/throat: Denies hoarseness, sore throat, bleeding gums, and difficulty swallowing. Heart: Denies chest pain, palpitations, racing heart, irregular heartbeat, leg pain or  swelling, and decreased activity tolerance. Respiratory: Denies breathing difficulty, shortness of breath, wheezing, cough, and sputum. GI: Denies change in appetite, heartburn, nausea, vomiting, constipation, diarrhea, and blood in stool. GU: Denies difficulty urinating, pain with urinating, urgency, frequency, blood in urine. Musculoskeletal: Denies joint stiffness, pain, swelling, muscle weakness. Skin: Denies rash, itching, mass, tumors, sores, and boils Neurologic: Denies headache, fainting, dizziness, seizures, numbness, and tingling. Psychiatric: Denies depression, anxiety, difficulty sleeping, and memory loss. Endocrine: Denies heat or cold intolerance, and increased thirst or urination. Blood/lymph: Denies easy bruising, easy bruising, and swollen glands     Objective:     BP 139/90   Pulse 98   Temp 98 F (36.7 C) (Oral)   Resp (!) 23   Ht 5\' 9"  (1.753 m)   Wt 81.6 kg   SpO2 97%   BMI 26.58 kg/m   Constitutional :  alert, cooperative, appears stated age and no distress  Lymphatics/Throat:  no asymmetry, masses, or scars  Respiratory:  clear to auscultation bilaterally  Cardiovascular:  regular rate and rhythm  Gastrointestinal: soft, non-tender; bowel sounds normal; no masses,  no organomegaly.   Musculoskeletal: Steady movement  Skin: Cool and moist  Psychiatric: Normal affect, non-agitated, not confused       LABS:  CMP Latest Ref Rng & Units 07/10/2020  Glucose 70 - 99 mg/dL 176(H)  BUN 8 - 23 mg/dL 20  Creatinine 0.61 - 1.24 mg/dL 0.86  Sodium 135 - 145 mmol/L 139  Potassium 3.5 - 5.1 mmol/L 4.1  Chloride 98 - 111 mmol/L 110  CO2 22 - 32  mmol/L 21(L)  Calcium 8.9 - 10.3 mg/dL 8.1(L)  Total Protein 6.5 - 8.1 g/dL 5.6(L)  Total Bilirubin 0.3 - 1.2 mg/dL 0.7  Alkaline Phos 38 - 126 U/L 46  AST 15 - 41 U/L 14(L)  ALT 0 - 44 U/L 17   CBC Latest Ref Rng & Units 07/11/2020 07/10/2020  WBC 4.0 - 10.5 K/uL - 12.6(H)  Hemoglobin 13.0 - 17.0 g/dL 9.8(L) 10.4(L)   Hematocrit 39.0 - 52.0 % 29.1(L) 32.2(L)  Platelets 150 - 400 K/uL - 160    RADS: CLINICAL DATA:  Abdominal pain, recent cecal polypectomy, dizziness  EXAM: CT ABDOMEN AND PELVIS WITH CONTRAST  TECHNIQUE: Multidetector CT imaging of the abdomen and pelvis was performed using the standard protocol following bolus administration of intravenous contrast.  CONTRAST:  132mL OMNIPAQUE IOHEXOL 300 MG/ML  SOLN  COMPARISON:  None.  FINDINGS: Lower chest: No acute pleural or parenchymal lung disease.  Hepatobiliary: Multiple hepatic cysts. No biliary dilation. The gallbladder is unremarkable.  Pancreas: Unremarkable. No pancreatic ductal dilatation or surrounding inflammatory changes.  Spleen: Normal in size without focal abnormality.  Adrenals/Urinary Tract: There are multiple cortical calcifications left kidney with overlying scarring. Punctate less than 2 mm nonobstructing right renal calculi. No obstructive uropathy within either kidney. The adrenals and bladder are unremarkable.  Stomach/Bowel: No bowel obstruction or ileus. Normal appendix right lower quadrant.  There is marked wall thickening of the cecum, which could be related to recent cecal polypectomy. Small foci of intramural gas best seen on images 51-53 of series 2 are likely related to recent polypectomy. There is a short segment intussusception involving the proximal ascending colon, best seen on coronal image 43 of series 5.  No evidence of bowel perforation, fluid collection, or abscess.  Vascular/Lymphatic: Aortic atherosclerosis. No enlarged abdominal or pelvic lymph nodes.  Reproductive: Prostate is unremarkable.  Other: There is mesenteric edema surrounding the cecum, without free fluid or free gas within the peritoneal cavity. No abdominal wall hernia.  Musculoskeletal: Chronic L2 compression fracture. No acute bony abnormalities. Reconstructed images demonstrate no  additional findings.  IMPRESSION: 1. Marked wall thickening of the cecum and proximal ascending colon, with focal area of intramural gas in the cecum likely related to recent polypectomy. No evidence of perforation. 2. Short segment colo-colic intussusception involving the proximal ascending colon. No evidence of obstruction. 3. Punctate nonobstructing right renal calculi. 4. Left renal cortical scarring with cortical calcifications. 5.  Aortic Atherosclerosis (ICD10-I70.0).   Electronically Signed   By: Randa Ngo M.D.   On: 07/10/2020 23:47  Assessment:   Lower GI bleed Bowel intussusception History of tubulovillous adenomas  Plan:    Review of CT images by myself along with discussion of radiologist done.  The area of intussusception could potentially, likely, be the additional tubulovillous adenoma that was noted on his most recent colonoscopy.  This is separate from the cecal adenoma that was recently resected per report.  Due to his resolution of symptoms, recommend continued observation for now and no need for immediate surgical intervention, even if this is a true intussusception. If he continues to have bleeding, will defer to GI services first for possible endoscopic intervention.   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  I explained to him that I will be able to provide that service if it comes to that point if he will be interested with continuing  his care with me.  I will reach out to his endoscopist to give him an update on this recent visit as well.  Continue current management for GI medical team.  Surgery will be available if needed.  Please call with any additional questions or concerns

## 2020-07-11 NOTE — Discharge Summary (Signed)
[place] [facility] [place] NAME: Bobby Moreno    MR#:  [id number]  DATE OF BIRTH:  1945/09/26  DATE OF ADMISSION:  07/10/2020 ADMITTING PHYSICIAN: Rhetta Mura, DO  DATE OF DISCHARGE: 07/11/2020  PRIMARY CARE PHYSICIAN: Zeb Comfort, MD    ADMISSION DIAGNOSIS:  Syncope and collapse [R55] Hematochezia [K92.1] Acute lower GI bleeding [K92.2]  DISCHARGE DIAGNOSIS:  Principal Problem:   Acute lower GI bleeding Active Problems:   Acute blood loss anemia   HTN (hypertension)   HLD (hyperlipidemia)   Syncope   Leukocytosis   SECONDARY DIAGNOSIS:   Past Medical History:  Diagnosis Date  . HLD (hyperlipidemia)   . HTN (hypertension)   . PSVT (paroxysmal supraventricular tachycardia) ([place])     HOSPITAL COURSE:   1.  Acute blood loss anemia with hematochezia after colonoscopy and polypectomy.  Seen by GI and surgery here.  No further bleeding.  Patient feeling better and wants to go home.  Follow-up with your gastroenterologist as outpatient.  Hemoglobin 9.5 upon discharge.  Patient was given IV fluids which could also dilute this down a little bit.  Patient is iron deficient and deferred iron infusion while here.  Patient advised to stay away from aspirin Advil Motrin Aleve and Goody powder.  Patient states that he does not take any of these medications. 2.  Possibility of intussusception seen on CT scan which likely could be the tubulovillous adenoma that was noted on the recent colonoscopy.  General surgery was okay with discharge home. 3.  Patient states that he will have a repeat colonoscopy in June.  If any further bleeding may have to have a repeat colonoscopy sooner. 4.  Syncope with dehydration.  Patient felt a lot better once the IV fluids were hooked up in the emergency room. 5.  Relative thrombocytopenia follow-up as outpatient 6.  Initial leukocytosis likely secondary to dehydration.  Repeat white blood cell count  normalized.  DISCHARGE CONDITIONS:   Satisfactory  CONSULTS OBTAINED:  General surgery Gastroenterology  DRUG ALLERGIES:  No Known Allergies  DISCHARGE MEDICATIONS:   Allergies as of 07/11/2020   No Known Allergies     Medication List    You have not been prescribed any medications.      DISCHARGE INSTRUCTIONS:   Follow-up PMD 5 days Follow-up your gastroenterologist  If you experience worsening of your admission symptoms, develop shortness of breath, life threatening emergency, suicidal or homicidal thoughts you must seek medical attention immediately by calling 911 or calling your MD immediately  if symptoms less severe.  You Must read complete instructions/literature along with all the possible adverse reactions/side effects for all the Medicines you take and that have been prescribed to you. Take any new Medicines after you have completely understood and accept all the possible adverse reactions/side effects.   Please note  You were cared for by a hospitalist during your hospital stay. If you have any questions about your discharge medications or the care you received while you were in the hospital after you are discharged, you can call the unit and asked to speak with the hospitalist on call if the hospitalist that took care of you is not available. Once you are discharged, your primary care physician will handle any further medical issues. Please note that NO REFILLS for any discharge medications will be authorized once you are discharged, as it is imperative that you return to your primary care physician (or establish a relationship with a primary  care physician if you do not have one) for your aftercare needs so that they can reassess your need for medications and monitor your lab values.    Today   CHIEF COMPLAINT:   Chief Complaint  Patient presents with  . Loss of Consciousness    HISTORY OF PRESENT ILLNESS:  Bobby Moreno  is a 75 y.o. male came in with  rectal bleeding and syncope   VITAL SIGNS:  Blood pressure 135/81, pulse 93, temperature 98.2 F (36.8 C), resp. rate 18, height 5\' 9"  (1.753 m), weight 82.8 kg, SpO2 97 %.  I/O:    Intake/Output Summary (Last 24 hours) at 07/11/2020 1529 Last data filed at 07/11/2020 1424 Gross per 24 hour  Intake 914.52 ml  Output 200 ml  Net 714.52 ml    PHYSICAL EXAMINATION:  GENERAL:  75 y.o.-year-old patient lying in the bed with no acute distress.  EYES: Pupils equal, round, reactive to light and accommodation. No scleral icterus. HEENT: Head atraumatic, normocephalic. Oropharynx and nasopharynx clear.  LUNGS: Normal breath sounds bilaterally, no wheezing, rales,rhonchi or crepitation. No use of accessory muscles of respiration.  CARDIOVASCULAR: S1, S2 normal. No murmurs, rubs, or gallops.  ABDOMEN: Soft, non-tender, non-distended.  EXTREMITIES: No pedal edema.  NEUROLOGIC: Cranial nerves II through XII are intact. Muscle strength 5/5 in all extremities. Sensation intact. Gait not checked.  PSYCHIATRIC: The patient is alert and oriented x 3.  SKIN: No obvious rash, lesion, or ulcer.   DATA REVIEW:   CBC Recent Labs  Lab 07/11/20 0558 07/11/20 0833  WBC 7.9  --   HGB 9.9* 9.5*  HCT 29.0* 29.1*  PLT 146*  --     Chemistries  Recent Labs  Lab 07/11/20 0558  NA 141  K 4.3  CL 110  CO2 26  GLUCOSE 119*  BUN 16  CREATININE 0.70  CALCIUM 8.1*  MG 1.9  AST 12*  ALT 14  ALKPHOS 41  BILITOT 0.9     Microbiology Results  Results for orders placed or performed during the hospital encounter of 07/10/20  SARS CORONAVIRUS 2 (TAT 6-24 HRS) Nasopharyngeal Nasopharyngeal Swab     Status: None   Collection Time: 07/10/20  7:31 PM   Specimen: Nasopharyngeal Swab  Result Value Ref Range Status   SARS Coronavirus 2 NEGATIVE NEGATIVE Final    Comment: (NOTE) SARS-CoV-2 target nucleic acids are NOT DETECTED.  The SARS-CoV-2 RNA is generally detectable in upper and  lower respiratory specimens during the acute phase of infection. Negative results do not preclude SARS-CoV-2 infection, do not rule out co-infections with other pathogens, and should not be used as the sole basis for treatment or other patient management decisions. Negative results must be combined with clinical observations, patient history, and epidemiological information. The expected result is Negative.  Fact Sheet for Patients: SugarRoll.be  Fact Sheet for Healthcare Providers: https://www.woods-mathews.com/  This test is not yet approved or cleared by the [place] FDA and  has been authorized for detection and/or diagnosis of SARS-CoV-2 by FDA under an Emergency Use Authorization (EUA). This EUA will remain  in effect (meaning this test can be used) for the duration of the COVID-19 declaration under Se ction 564(b)(1) of the Act, 21 U.S.C. section 360bbb-3(b)(1), unless the authorization is terminated or revoked sooner.  Performed at [facility], [place] [address], [place], [place] [address]     RADIOLOGY:  CT ABDOMEN PELVIS W CONTRAST  Result Date: 07/10/2020 CLINICAL DATA:  Abdominal pain, recent cecal  polypectomy, dizziness EXAM: CT ABDOMEN AND PELVIS WITH CONTRAST TECHNIQUE: Multidetector CT imaging of the abdomen and pelvis was performed using the standard protocol following bolus administration of intravenous contrast. CONTRAST:  167mL OMNIPAQUE IOHEXOL 300 MG/ML  SOLN COMPARISON:  None. FINDINGS: Lower chest: No acute pleural or parenchymal lung disease. Hepatobiliary: Multiple hepatic cysts. No biliary dilation. The gallbladder is unremarkable. Pancreas: Unremarkable. No pancreatic ductal dilatation or surrounding inflammatory changes. Spleen: Normal in size without focal abnormality. Adrenals/Urinary Tract: There are multiple cortical calcifications left kidney with overlying scarring. Punctate less than 2 mm nonobstructing  right renal calculi. No obstructive uropathy within either kidney. The adrenals and bladder are unremarkable. Stomach/Bowel: No bowel obstruction or ileus. Normal appendix right lower quadrant. There is marked wall thickening of the cecum, which could be related to recent cecal polypectomy. Small foci of intramural gas best seen on images 51-53 of series 2 are likely related to recent polypectomy. There is a short segment intussusception involving the proximal ascending colon, best seen on coronal image 43 of series 5. No evidence of bowel perforation, fluid collection, or abscess. Vascular/Lymphatic: Aortic atherosclerosis. No enlarged abdominal or pelvic lymph nodes. Reproductive: Prostate is unremarkable. Other: There is mesenteric edema surrounding the cecum, without free fluid or free gas within the peritoneal cavity. No abdominal wall hernia. Musculoskeletal: Chronic L2 compression fracture. No acute bony abnormalities. Reconstructed images demonstrate no additional findings. IMPRESSION: 1. Marked wall thickening of the cecum and proximal ascending colon, with focal area of intramural gas in the cecum likely related to recent polypectomy. No evidence of perforation. 2. Short segment colo-colic intussusception involving the proximal ascending colon. No evidence of obstruction. 3. Punctate nonobstructing right renal calculi. 4. Left renal cortical scarring with cortical calcifications. 5.  Aortic Atherosclerosis (ICD10-I70.0). Electronically Signed   By: Randa Ngo M.D.   On: 07/10/2020 23:47     Management plans discussed with the patient, family and they are in agreement.  CODE STATUS:     Code Status Orders  (From admission, onward)         Start     Ordered   07/10/20 2321  Full code  Continuous        07/10/20 2321        Code Status History    This patient has a current code status but no historical code status.   Advance Care Planning Activity      TOTAL TIME TAKING CARE OF  THIS PATIENT: 35 minutes.    Loletha Grayer M.D on 07/11/2020 at 3:29 PM  Between 7am to 6pm - Pager - [phone]  After 6pm go to [email or web address] - password [email or web address]  [facility]  CC: Primary care physician; Zeb Comfort, MD

## 2020-07-16 LAB — METHYLMALONIC ACID, SERUM: Methylmalonic Acid, Quantitative: 66 nmol/L (ref 0–378)

## 2021-05-26 ENCOUNTER — Encounter: Payer: Self-pay | Admitting: Medical Oncology

## 2021-05-26 ENCOUNTER — Emergency Department
Admission: EM | Admit: 2021-05-26 | Discharge: 2021-05-26 | Discharge status: Against Medical Advice | Payer: Medicare HMO | Attending: Emergency Medicine | Admitting: Emergency Medicine

## 2021-05-26 DIAGNOSIS — I471 Supraventricular tachycardia: Secondary | ICD-10-CM | POA: Diagnosis present

## 2021-05-26 DIAGNOSIS — Z5321 Procedure and treatment not carried out due to patient leaving prior to being seen by health care provider: Secondary | ICD-10-CM | POA: Insufficient documentation

## 2021-05-26 NOTE — ED Notes (Signed)
After seeing EKG and feeling back to baseline, pt now reports that he does not wish to be seen by ED MD and would like to call his own Cardiologist. Pt signed AMA, has been told to return if he has SVT again.

## 2021-05-26 NOTE — ED Triage Notes (Signed)
Pt reports that did something strenous this am and while he was doing it he felt like his heart was racing, pt reports hx of SVT and it felt the same as then. Episode lasted approx 2 hours. Pt takes metoprolol for same. States that he has not missed any doses and arrived to ED pain free and feeling back to baseline.

## 2021-09-12 IMAGING — CT CT ABD-PELV W/ CM
2 of 5 series · 16 of 46 positions shown, 18 images · IV contrast (APPLIED)
Comparison: None.

CLINICAL DATA: Abdominal pain, recent cecal polypectomy, dizziness

EXAM:
CT ABDOMEN AND PELVIS WITH CONTRAST
TECHNIQUE: Multidetector CT imaging of the abdomen and pelvis was performed
using the standard protocol following bolus administration of
intravenous contrast.
CONTRAST:  100mL OMNIPAQUE IOHEXOL 300 MG/ML  SOLN

[Series 2: routine abd/pel with · axial · 0.86mm/px · z∈[-773,-348]mm · 13 of 97 slices shown, 15 images]
[im 6/97  soft-tissue]
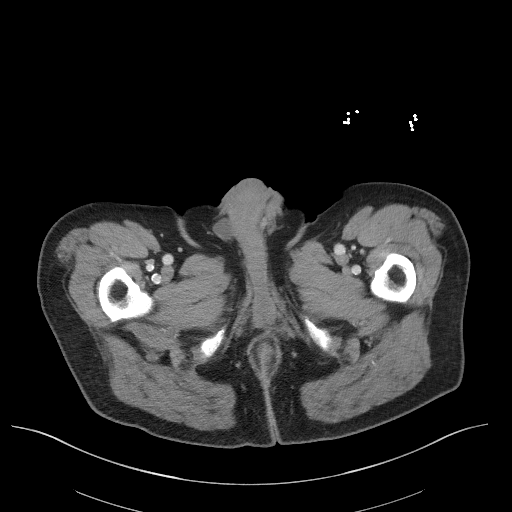
[im 6/97  bone]
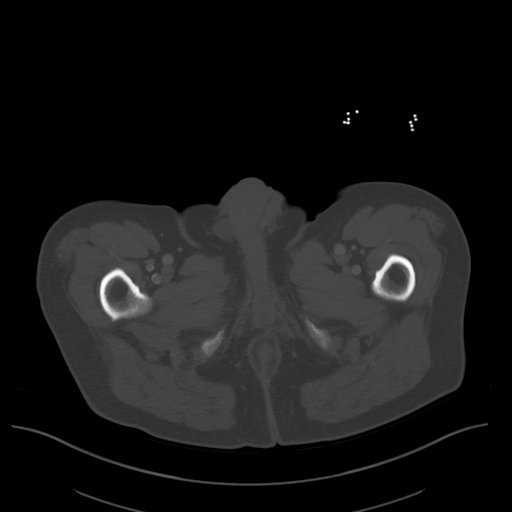
[im 16/97  soft-tissue]
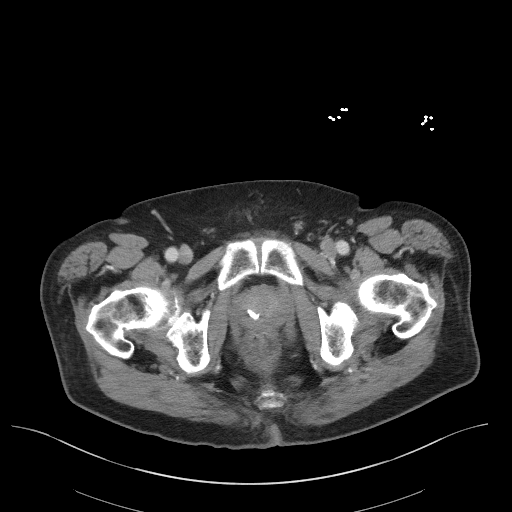
[im 21/97  soft-tissue]
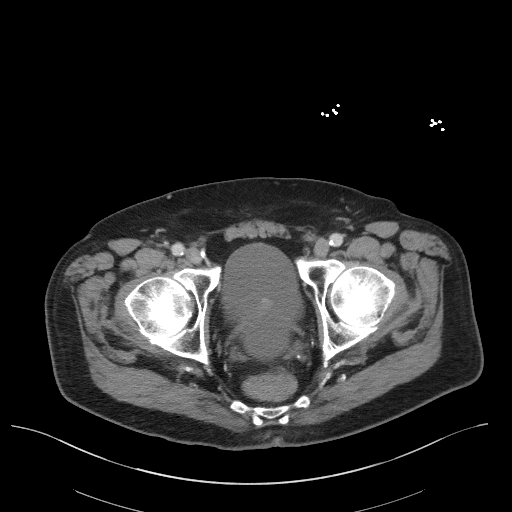
[im 26/97  soft-tissue]
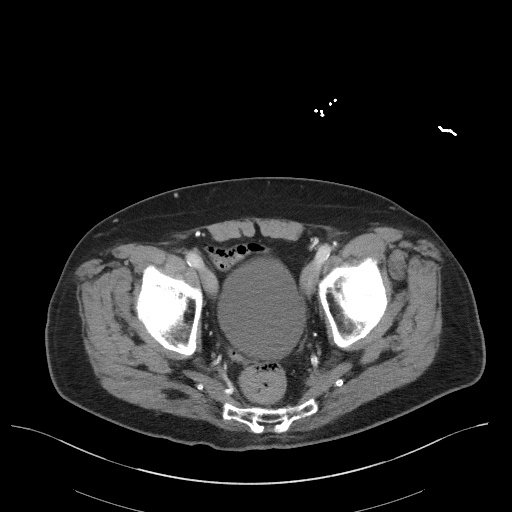
[im 36/97  soft-tissue]
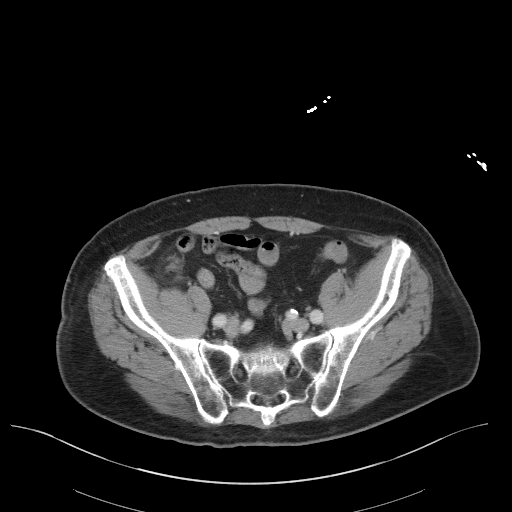
[im 41/97  soft-tissue]
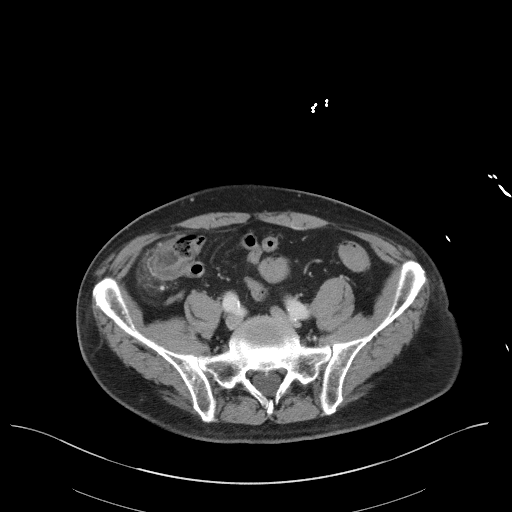
[im 51/97  soft-tissue]
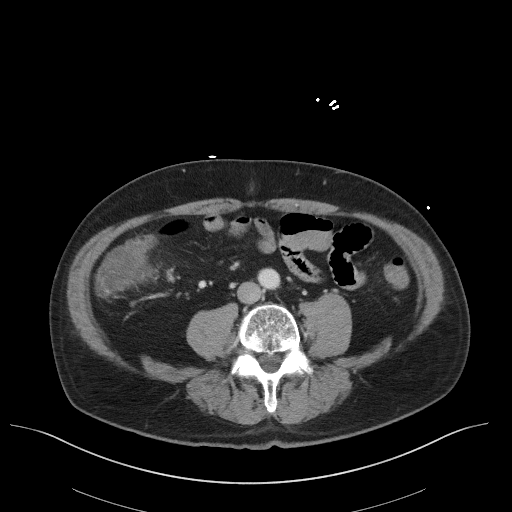
[im 56/97  soft-tissue]
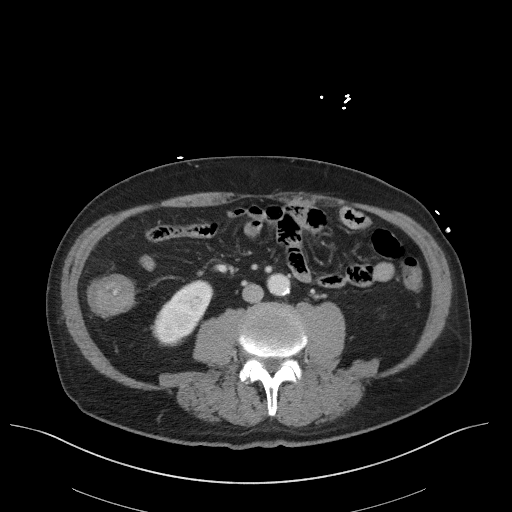
[im 61/97  soft-tissue]
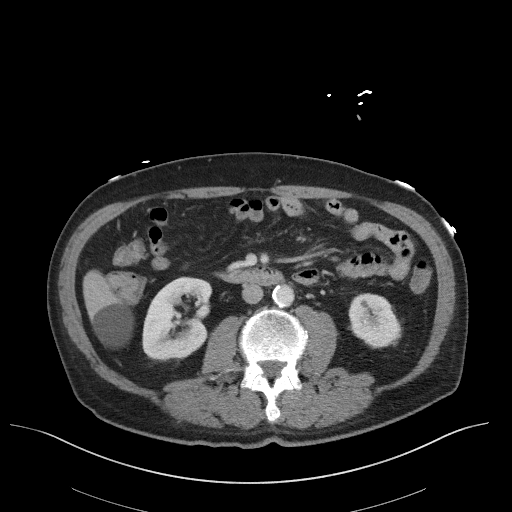
[im 61/97  bone]
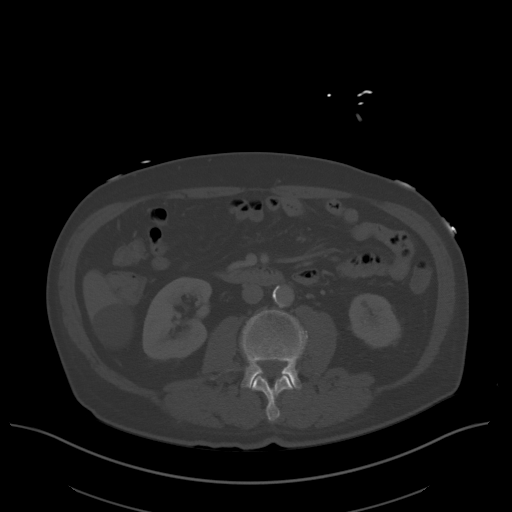
[im 71/97  soft-tissue]
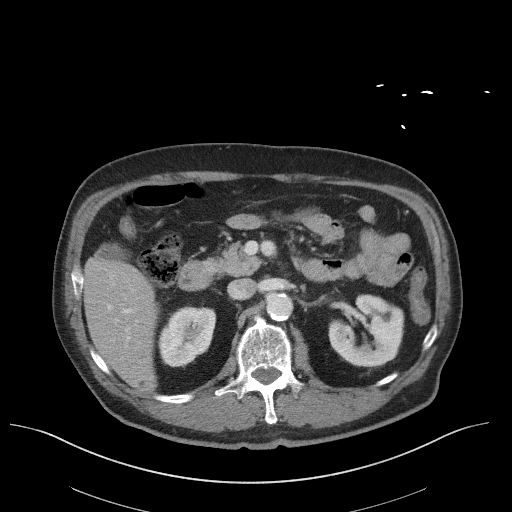
[im 76/97  soft-tissue]
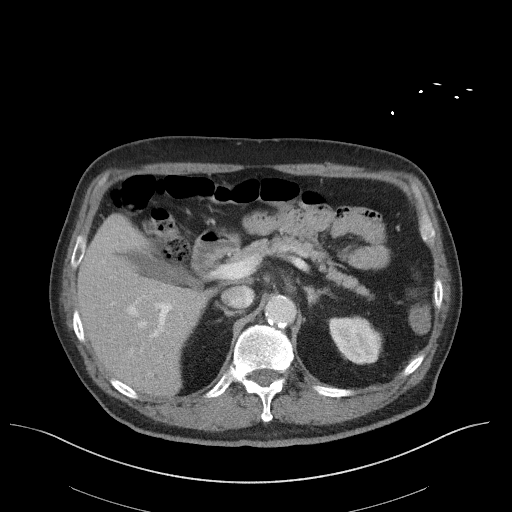
[im 81/97  soft-tissue]
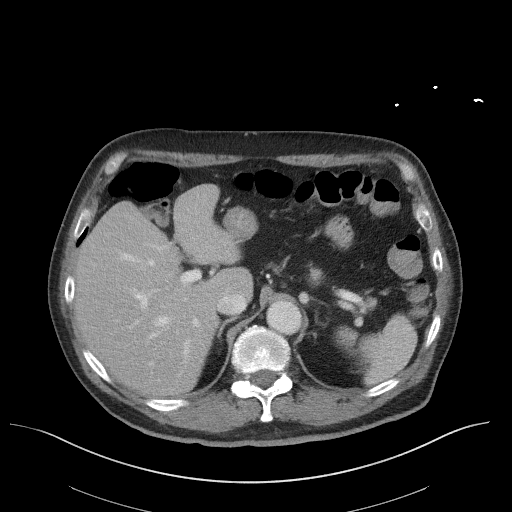
[im 91/97  soft-tissue]
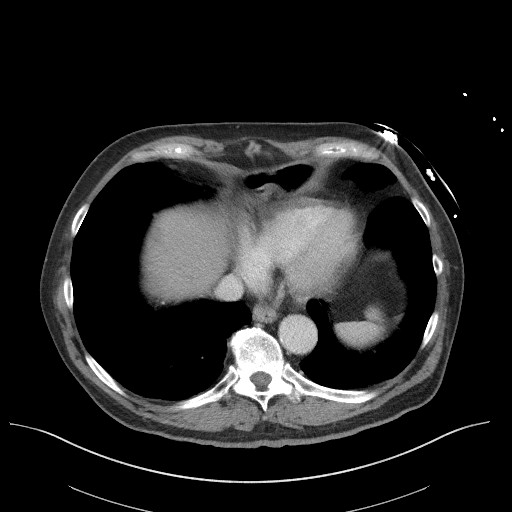

[Series 5: coronal st · coronal · 0.76mm/px · 3 of 90 slices shown]
[im 30/90  soft-tissue]
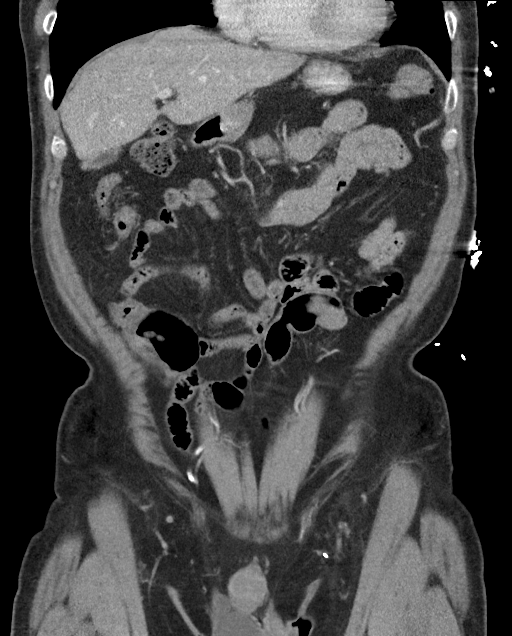
[im 40/90  soft-tissue]
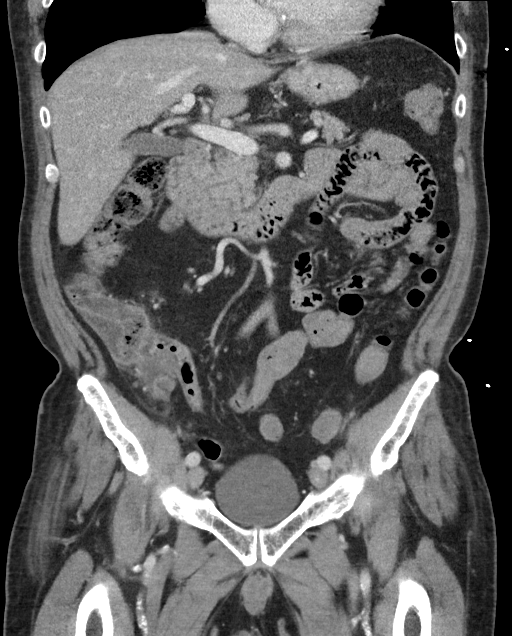
[im 50/90  soft-tissue]
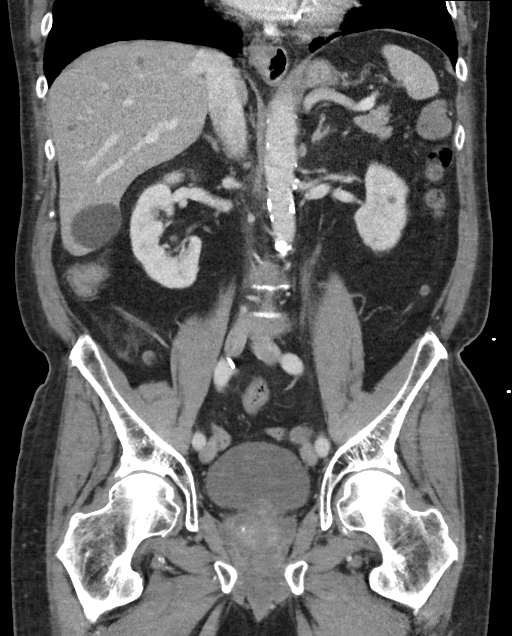

[16 of 46 positions shown; findings below may reference images not displayed]

FINDINGS: Lower chest: No acute pleural or parenchymal lung disease.

Hepatobiliary: Multiple hepatic cysts. No biliary dilation. The
gallbladder is unremarkable.

Pancreas: Unremarkable. No pancreatic ductal dilatation or
surrounding inflammatory changes.

Spleen: Normal in size without focal abnormality.

Adrenals/Urinary Tract: There are multiple cortical calcifications
left kidney with overlying scarring. Punctate less than 2 mm
nonobstructing right renal calculi. No obstructive uropathy within
either kidney. The adrenals and bladder are unremarkable.

Stomach/Bowel: No bowel obstruction or ileus. Normal appendix right
lower quadrant.

There is marked wall thickening of the cecum, which could be related
to recent cecal polypectomy. Small foci of intramural gas best seen
on images 51-53 of series 2 are likely related to recent
polypectomy. There is a short segment intussusception involving the
proximal ascending colon, best seen on coronal image 43 of series 5.

No evidence of bowel perforation, fluid collection, or abscess.

Vascular/Lymphatic: Aortic atherosclerosis. No enlarged abdominal or
pelvic lymph nodes.

Reproductive: Prostate is unremarkable.

Other: There is mesenteric edema surrounding the cecum, without free
fluid or free gas within the peritoneal cavity. No abdominal wall
hernia.

Musculoskeletal: Chronic L2 compression fracture. No acute bony
abnormalities. Reconstructed images demonstrate no additional
findings.
IMPRESSION: 1. Marked wall thickening of the cecum and proximal ascending colon,
with focal area of intramural gas in the cecum likely related to
recent polypectomy. No evidence of perforation.
2. Short segment colo-colic intussusception involving the proximal
ascending colon. No evidence of obstruction.
3. Punctate nonobstructing right renal calculi.
4. Left renal cortical scarring with cortical calcifications.
5.  Aortic Atherosclerosis ([id number]-OD0.0).

## 2022-05-17 ENCOUNTER — Inpatient Hospital Stay
Admission: EM | Admit: 2022-05-17 | Discharge: 2022-05-24 | DRG: 374 | Disposition: A | Discharge status: Home / Self Care | Payer: Medicare HMO | Attending: Student | Admitting: Student

## 2022-05-17 ENCOUNTER — Inpatient Hospital Stay: Payer: Medicare HMO

## 2022-05-17 DIAGNOSIS — R571 Hypovolemic shock: Secondary | ICD-10-CM | POA: Diagnosis not present

## 2022-05-17 DIAGNOSIS — D508 Other iron deficiency anemias: Secondary | ICD-10-CM | POA: Diagnosis not present

## 2022-05-17 DIAGNOSIS — Z87891 Personal history of nicotine dependence: Secondary | ICD-10-CM

## 2022-05-17 DIAGNOSIS — I471 Supraventricular tachycardia, unspecified: Principal | ICD-10-CM

## 2022-05-17 DIAGNOSIS — E872 Acidosis, unspecified: Secondary | ICD-10-CM | POA: Diagnosis not present

## 2022-05-17 DIAGNOSIS — I10 Essential (primary) hypertension: Secondary | ICD-10-CM | POA: Diagnosis present

## 2022-05-17 DIAGNOSIS — I5030 Unspecified diastolic (congestive) heart failure: Secondary | ICD-10-CM | POA: Diagnosis present

## 2022-05-17 DIAGNOSIS — Z9049 Acquired absence of other specified parts of digestive tract: Secondary | ICD-10-CM | POA: Diagnosis not present

## 2022-05-17 DIAGNOSIS — Z8249 Family history of ischemic heart disease and other diseases of the circulatory system: Secondary | ICD-10-CM | POA: Diagnosis not present

## 2022-05-17 DIAGNOSIS — I4892 Unspecified atrial flutter: Secondary | ICD-10-CM | POA: Diagnosis present

## 2022-05-17 DIAGNOSIS — D696 Thrombocytopenia, unspecified: Secondary | ICD-10-CM | POA: Diagnosis not present

## 2022-05-17 DIAGNOSIS — Z85038 Personal history of other malignant neoplasm of large intestine: Secondary | ICD-10-CM

## 2022-05-17 DIAGNOSIS — I4719 Other supraventricular tachycardia: Secondary | ICD-10-CM | POA: Diagnosis present

## 2022-05-17 DIAGNOSIS — I4891 Unspecified atrial fibrillation: Secondary | ICD-10-CM | POA: Diagnosis not present

## 2022-05-17 DIAGNOSIS — C155 Malignant neoplasm of lower third of esophagus: Secondary | ICD-10-CM

## 2022-05-17 DIAGNOSIS — J101 Influenza due to other identified influenza virus with other respiratory manifestations: Secondary | ICD-10-CM | POA: Diagnosis present

## 2022-05-17 DIAGNOSIS — R578 Other shock: Secondary | ICD-10-CM | POA: Diagnosis not present

## 2022-05-17 DIAGNOSIS — R002 Palpitations: Secondary | ICD-10-CM | POA: Diagnosis present

## 2022-05-17 DIAGNOSIS — Z79899 Other long term (current) drug therapy: Secondary | ICD-10-CM

## 2022-05-17 DIAGNOSIS — R000 Tachycardia, unspecified: Secondary | ICD-10-CM | POA: Insufficient documentation

## 2022-05-17 DIAGNOSIS — C159 Malignant neoplasm of esophagus, unspecified: Secondary | ICD-10-CM | POA: Diagnosis present

## 2022-05-17 DIAGNOSIS — I959 Hypotension, unspecified: Secondary | ICD-10-CM | POA: Insufficient documentation

## 2022-05-17 DIAGNOSIS — Z9842 Cataract extraction status, left eye: Secondary | ICD-10-CM

## 2022-05-17 DIAGNOSIS — R55 Syncope and collapse: Secondary | ICD-10-CM | POA: Diagnosis present

## 2022-05-17 DIAGNOSIS — I110 Hypertensive heart disease with heart failure: Secondary | ICD-10-CM | POA: Diagnosis present

## 2022-05-17 DIAGNOSIS — Z1152 Encounter for screening for COVID-19: Secondary | ICD-10-CM | POA: Diagnosis not present

## 2022-05-17 DIAGNOSIS — K922 Gastrointestinal hemorrhage, unspecified: Secondary | ICD-10-CM

## 2022-05-17 DIAGNOSIS — K921 Melena: Secondary | ICD-10-CM | POA: Diagnosis not present

## 2022-05-17 DIAGNOSIS — R1319 Other dysphagia: Secondary | ICD-10-CM | POA: Diagnosis present

## 2022-05-17 DIAGNOSIS — I2489 Other forms of acute ischemic heart disease: Secondary | ICD-10-CM | POA: Diagnosis present

## 2022-05-17 DIAGNOSIS — E785 Hyperlipidemia, unspecified: Secondary | ICD-10-CM | POA: Diagnosis present

## 2022-05-17 DIAGNOSIS — D649 Anemia, unspecified: Secondary | ICD-10-CM | POA: Diagnosis present

## 2022-05-17 DIAGNOSIS — R1314 Dysphagia, pharyngoesophageal phase: Secondary | ICD-10-CM | POA: Diagnosis present

## 2022-05-17 DIAGNOSIS — K2289 Other specified disease of esophagus: Secondary | ICD-10-CM

## 2022-05-17 DIAGNOSIS — R7989 Other specified abnormal findings of blood chemistry: Secondary | ICD-10-CM | POA: Diagnosis not present

## 2022-05-17 DIAGNOSIS — Z9841 Cataract extraction status, right eye: Secondary | ICD-10-CM

## 2022-05-17 DIAGNOSIS — D509 Iron deficiency anemia, unspecified: Secondary | ICD-10-CM | POA: Diagnosis not present

## 2022-05-17 DIAGNOSIS — D62 Acute posthemorrhagic anemia: Secondary | ICD-10-CM | POA: Diagnosis not present

## 2022-05-17 LAB — COMPREHENSIVE METABOLIC PANEL
ALT: 23 U/L (ref 0–44)
AST: 37 U/L (ref 15–41)
Albumin: 3.1 g/dL — ABNORMAL LOW (ref 3.5–5.0)
Alkaline Phosphatase: 57 U/L (ref 38–126)
Anion gap: 8 (ref 5–15)
BUN: 23 mg/dL (ref 8–23)
CO2: 23 mmol/L (ref 22–32)
Calcium: 8.1 mg/dL — ABNORMAL LOW (ref 8.9–10.3)
Chloride: 101 mmol/L (ref 98–111)
Creatinine, Ser: 0.87 mg/dL (ref 0.61–1.24)
GFR, Estimated: >60 mL/min (ref >60)
Glucose, Bld: 154 mg/dL — ABNORMAL HIGH (ref 70–99)
Potassium: 4 mmol/L (ref 3.5–5.1)
Sodium: 132 mmol/L — ABNORMAL LOW (ref 135–145)
Total Bilirubin: 0.4 mg/dL (ref 0.3–1.2)
Total Protein: 6.3 g/dL — ABNORMAL LOW (ref 6.5–8.1)

## 2022-05-17 LAB — CBC WITH DIFFERENTIAL/PLATELET
Abs Immature Granulocytes: 0.03 10*3/uL (ref 0.00–0.07)
Basophils Absolute: 0 10*3/uL (ref 0.0–0.1)
Basophils Relative: 0 %
Eosinophils Absolute: 0 10*3/uL (ref 0.0–0.5)
Eosinophils Relative: 0 %
HCT: 30.8 % — ABNORMAL LOW (ref 39.0–52.0)
Hemoglobin: 9.8 g/dL — ABNORMAL LOW (ref 13.0–17.0)
Immature Granulocytes: 0 %
Lymphocytes Relative: 12 %
Lymphs Abs: 1 10*3/uL (ref 0.7–4.0)
MCH: 30.6 pg (ref 26.0–34.0)
MCHC: 31.8 g/dL (ref 30.0–36.0)
MCV: 96.3 fL (ref 80.0–100.0)
Monocytes Absolute: 0.6 10*3/uL (ref 0.1–1.0)
Monocytes Relative: 7 %
Neutro Abs: 7.3 10*3/uL (ref 1.7–7.7)
Neutrophils Relative %: 81 %
Platelets: 255 10*3/uL (ref 150–400)
RBC: 3.2 MIL/uL — ABNORMAL LOW (ref 4.22–5.81)
RDW: 12.4 % (ref 11.5–15.5)
WBC: 8.9 10*3/uL (ref 4.0–10.5)
nRBC: 0 % (ref 0.0–0.2)

## 2022-05-17 LAB — RESP PANEL BY RT-PCR (RSV, FLU A&B, COVID)  RVPGX2
Influenza A by PCR: POSITIVE — AB
Influenza B by PCR: NEGATIVE
Resp Syncytial Virus by PCR: NEGATIVE
SARS Coronavirus 2 by RT PCR: NEGATIVE

## 2022-05-17 LAB — CBG MONITORING, ED: Glucose-Capillary: 134 mg/dL — ABNORMAL HIGH (ref 70–99)

## 2022-05-17 LAB — TROPONIN I (HIGH SENSITIVITY)
Troponin I (High Sensitivity): 33 ng/L — ABNORMAL HIGH (ref <18)
Troponin I (High Sensitivity): 73 ng/L — ABNORMAL HIGH (ref <18)

## 2022-05-17 LAB — BRAIN NATRIURETIC PEPTIDE: B Natriuretic Peptide: 60.7 pg/mL (ref 0.0–100.0)

## 2022-05-17 MED ORDER — ONDANSETRON HCL 4 MG/2ML IJ SOLN: 4.0000 mg | Freq: Four times a day (QID) | INTRAMUSCULAR | Status: DC | PRN

## 2022-05-17 MED ORDER — LABETALOL HCL 5 MG/ML IV SOLN
5.0000 mg | INTRAVENOUS | Status: DC | PRN
  Filled 2022-05-17: qty 4

## 2022-05-17 MED ORDER — ACETAMINOPHEN 325 MG PO TABS
650.0000 mg | ORAL_TABLET | Freq: Once | ORAL | Status: AC
  Administered 2022-05-17: 650 mg via ORAL
  Filled 2022-05-17: qty 2

## 2022-05-17 MED ORDER — SODIUM CHLORIDE 0.9 % IV BOLUS
1000.0000 mL | Freq: Once | INTRAVENOUS | Status: AC
  Administered 2022-05-17: 1000 mL via INTRAVENOUS

## 2022-05-17 MED ORDER — SENNOSIDES-DOCUSATE SODIUM 8.6-50 MG PO TABS: 1.0000 | ORAL_TABLET | Freq: Every evening | ORAL | Status: DC | PRN

## 2022-05-17 MED ORDER — PRAVASTATIN SODIUM 20 MG PO TABS: 40.0000 mg | ORAL_TABLET | Freq: Every day | ORAL | Status: DC

## 2022-05-17 MED ORDER — METOPROLOL SUCCINATE ER 50 MG PO TB24: 50.0000 mg | ORAL_TABLET | Freq: Once | ORAL | Status: DC

## 2022-05-17 MED ORDER — ACETAMINOPHEN 325 MG PO TABS: 650.0000 mg | ORAL_TABLET | Freq: Four times a day (QID) | ORAL | Status: DC | PRN

## 2022-05-17 MED ORDER — ONDANSETRON HCL 4 MG PO TABS: 4.0000 mg | ORAL_TABLET | Freq: Four times a day (QID) | ORAL | Status: DC | PRN

## 2022-05-17 MED ORDER — ACETAMINOPHEN 650 MG RE SUPP: 650.0000 mg | Freq: Four times a day (QID) | RECTAL | Status: DC | PRN

## 2022-05-17 MED ORDER — HEPARIN SODIUM (PORCINE) 5000 UNIT/ML IJ SOLN
5000.0000 [IU] | Freq: Three times a day (TID) | INTRAMUSCULAR | Status: DC
  Administered 2022-05-17 – 2022-05-18 (×2): 5000 [IU] via SUBCUTANEOUS
  Filled 2022-05-17 (×2): qty 1

## 2022-05-17 NOTE — Assessment & Plan Note (Addendum)
With positive delta - Etiology workup in progress, suspect secondary to demand ischemia in setting of SVT - Continue to monitor - Complete echo ordered

## 2022-05-17 NOTE — Assessment & Plan Note (Signed)
-   Fall precautions - Complete echo ordered

## 2022-05-17 NOTE — ED Triage Notes (Signed)
Pt BIBA from home. Pt was in SVT, around 205. Pt was gave 6 of adenosine, then 12 of adenosine and converted to NSR.  HR is now 120, BP 117/70, satting 94% on 4L  CBG 210  Initial BP 70/40   Pt takes metoprolol for the same in the past, takes at night- has not had today.

## 2022-05-17 NOTE — Assessment & Plan Note (Signed)
-   Patient had anemia study done outpatient: Serum iron 11 level was 12 and transferrin saturation was 3% - Patient states that he is currently being worked up for his new onset anemia outpatient - Given that iron absorption will require many days for an effect on hemoglobin, I have deferred iron supplementation on admission

## 2022-05-17 NOTE — ED Notes (Addendum)
Pt ambulating to the bathroom with his wife. Pt wife called out stating patient had fallen in the bathroom. Pt assisted to wheelchair with nursing staff. Pt wheeled back to his room. Pt became very pale, cool, diaphoretic, and clammy. Dr. Jori Moll bedside to evaluate patient. Orders given. Pt placed in the bed with nursing staff.

## 2022-05-17 NOTE — Assessment & Plan Note (Signed)
-   Labetalol 5 mg IV every 3 hours as needed for SBP greater than 175, 4 doses ordered 

## 2022-05-17 NOTE — ED Provider Notes (Signed)
[facility] Provider Note   Event Date/Time   First MD Initiated Contact with Patient 05/17/22 1845     (approximate) History  Palpitations  HPI Tobenna Needs is a 77 y.o. male with a stated past medical history of paroxysmal SVT, hypertension, and anemia who presents for palpitations via EMS after he was found to be in SVT.  EMS gave 1 bolus of 6 mg and a subsequent 12 mg bolus of adenosine which converted patient back to normal sinus rhythm however he continues to be tachycardic upon arrival.  Patient denies any continued palpitations.  Patient states that he normally takes metoprolol for this in the past but he has not had any today. ROS: Patient currently denies any vision changes, tinnitus, difficulty speaking, facial droop, sore throat, chest pain, shortness of breath, abdominal pain, nausea/vomiting/diarrhea, dysuria, or weakness/numbness/paresthesias in any extremity   Physical Exam  Triage Vital Signs: ED Triage Vitals  Enc Vitals Group     BP 05/17/22 1835 122/76     Pulse Rate 05/17/22 1835 (!) 126     Resp 05/17/22 1835 20     Temp 05/17/22 1835 (!) 100.5 F (38.1 C)     Temp Source 05/17/22 1835 Oral     SpO2 05/17/22 1835 93 %     Weight 05/17/22 1836 175 lb (79.4 kg)     Height 05/17/22 1836 '5\' 9"'$  (1.753 m)     Head Circumference --      Peak Flow --      Pain Score 05/17/22 1836 0     Pain Loc --      Pain Edu? --      Excl. in [place]? --    Most recent vital signs: Vitals:   05/17/22 2130 05/17/22 2251  BP: 97/65   Pulse: (!) 105   Resp: 18   Temp:  98.3 F (36.8 C)  SpO2: 95%    General: Awake, oriented x4. CV:  Good peripheral perfusion.  Resp:  Normal effort.  Abd:  No distention.  Other:  Elderly Caucasian male laying in bed in no acute distress ED Results / Procedures / Treatments  Labs (all labs ordered are listed, but only abnormal results are displayed) Labs Reviewed  RESP PANEL BY RT-PCR (RSV, FLU A&B, COVID)  RVPGX2 -  Abnormal; Notable for the following components:      Result Value   Influenza A by PCR POSITIVE (*)    All other components within normal limits  COMPREHENSIVE METABOLIC PANEL - Abnormal; Notable for the following components:   Sodium 132 (*)    Glucose, Bld 154 (*)    Calcium 8.1 (*)    Total Protein 6.3 (*)    Albumin 3.1 (*)    All other components within normal limits  CBC WITH DIFFERENTIAL/PLATELET - Abnormal; Notable for the following components:   RBC 3.20 (*)    Hemoglobin 9.8 (*)    HCT 30.8 (*)    All other components within normal limits  CBG MONITORING, ED - Abnormal; Notable for the following components:   Glucose-Capillary 134 (*)    All other components within normal limits  TROPONIN I (HIGH SENSITIVITY) - Abnormal; Notable for the following components:   Troponin I (High Sensitivity) 33 (*)    All other components within normal limits  TROPONIN I (HIGH SENSITIVITY) - Abnormal; Notable for the following components:   Troponin I (High Sensitivity) 73 (*)    All other components within normal limits  BRAIN NATRIURETIC PEPTIDE  BASIC METABOLIC PANEL  CBC  TYPE AND SCREEN  TROPONIN I (HIGH SENSITIVITY)   EKG ED ECG REPORT I, Naaman Plummer, the attending physician, personally viewed and interpreted this ECG. Date: 05/17/2022 EKG Time: 1841 Rate: 127 Rhythm: Tachycardic sinus rhythm QRS Axis: normal Intervals: normal ST/T Wave abnormalities: normal Narrative Interpretation: Tachycardic sinus rhythm.  No evidence of acute ischemia PROCEDURES: Critical Care performed: No .1-3 Lead EKG Interpretation  Performed by: Naaman Plummer, MD Authorized by: Naaman Plummer, MD     Interpretation: abnormal     ECG rate:  105   ECG rate assessment: tachycardic     Rhythm: sinus tachycardia     Ectopy: none     Conduction: normal    MEDICATIONS ORDERED IN ED: Medications  acetaminophen (TYLENOL) tablet 650 mg (has no administration in time range)    Or   acetaminophen (TYLENOL) suppository 650 mg (has no administration in time range)  ondansetron (ZOFRAN) tablet 4 mg (has no administration in time range)    Or  ondansetron (ZOFRAN) injection 4 mg (has no administration in time range)  heparin injection 5,000 Units (5,000 Units Subcutaneous Given 05/17/22 2249)  senna-docusate (Senokot-S) tablet 1 tablet (has no administration in time range)  labetalol (NORMODYNE) injection 5 mg (has no administration in time range)  pravastatin (PRAVACHOL) tablet 40 mg (has no administration in time range)  acetaminophen (TYLENOL) tablet 650 mg (650 mg Oral Given 05/17/22 1902)  sodium chloride 0.9 % bolus 1,000 mL (0 mLs Intravenous Stopped 05/17/22 2129)   IMPRESSION / MDM / [place] / ED COURSE  I reviewed the triage vital signs and the nursing notes.                             The patient is on the cardiac monitor to evaluate for evidence of arrhythmia and/or significant heart rate changes. Patient's presentation is most consistent with acute presentation with potential threat to life or bodily function. Presents with palpitations and ECG is noted to be indicative of supraventricular tachycardia. Palpitations unlikely secondary to other concomitant cause such as pulmonary embolus or ACS.  Interventions: Patient did not respond to vagal maneuvers per EMS The patients vital signs and clinical condition warranted the use chemical cardioversion, and given their rhythm adenosine was chosen. A time out was undertaken to assure that this was the correct patient and the correct procedure for this patient. Adenosine 12 mg was given in the standard rapid IV push fashion. The result of this chemical cardioversion was a return to a normal sinus rhythm. The patient tolerated this procedure very well, there were no complications.  This procedure occurred prior to arrival in our emergency department and upon arrival patient is in normal sinus rhythm  Post  Conversion ECG: Without overt e/o STEMI, Brugadas sign, delta wave, epsilon wave, significantly prolonged QTc, or malignant arrhythmia  Patient has tested positive for influenza Given History and Exam I have a lower suspicion for: Emergent CardioPulmonary causes [such as Acute Asthma or COPD Exacerbation, acute Heart Failure or exacerbation, PE, PTX, atypical ACS, PNA]. Emergent Otolaryngeal causes [such as PTA, RPA, [place]igs, Epiglottitis, EBV].  Regarding Emergent Travel or Immunosuppressive related infectious: I have a low suspicion for acute HIV.  Given radiologic evidence for SVT requiring cardioversion, dehydration, influenza positivity, and need for further evaluation and management, patient will require admission Disposition: Admit to medicine   FINAL CLINICAL IMPRESSION(S) /  ED DIAGNOSES   Final diagnoses:  Paroxysmal SVT (supraventricular tachycardia)  Palpitations   Rx / DC Orders   ED Discharge Orders     None      Note:  This document was prepared using Dragon voice recognition software and may include unintentional dictation errors.   Naaman Plummer, MD 05/17/22 [phone]

## 2022-05-17 NOTE — Assessment & Plan Note (Signed)
-   Pravastatin 40 mg qhs resumed

## 2022-05-17 NOTE — Assessment & Plan Note (Addendum)
-   Portable chest x-ray stat has been ordered - Symptomatic support including acetaminophen as needed

## 2022-05-17 NOTE — Hospital Course (Signed)
Bobby Moreno is a 77 year old male with history of hypertension, hyperlipidemia, paroxysmal supraventricular tachycardia, who presents emergency department for chief concerns of syncope.  Initial vitals in the emergency department showed temperature of 100.5, respiration rate of 22, heart rate 123, blood pressure 101/68, SpO2 2 L nasal cannula.  Serum sodium is 132, potassium 4.2, chloride 101, bicarb 23, BUN of 23, serum creatinine of 2.87, EGFR greater than 60, nonfasting blood glucose 154, WBC 8.9, hemoglobin 9.8, platelets of 255.  High sensitive troponin is 33.  Patient tested positive for influenza A PCR.  Per ED report: EMS found patient in SVT with heart rate around 200-205.  Patient was given and is not seen 6 and adenosine 12.  ED treatment: Acetaminophen 650 mg p.o. one-time dose, sodium chloride 500 mL bolus.

## 2022-05-17 NOTE — H&P (Addendum)
History and Physical   Bobby Moreno:[id number] DOB: 09-19-1945 DOA: 05/17/2022  PCP: Zeb Comfort, MD  Outpatient Specialists: [facility] Patient coming from: home via EMS  I have personally briefly reviewed patient's old medical records in [place].  Chief Concern: syncope  HPI: [name] is a 77 year old male with history of hypertension, hyperlipidemia, paroxysmal supraventricular tachycardia, who presents emergency department for chief concerns of syncope.  Initial vitals in the emergency department showed temperature of 100.5, respiration rate of 22, heart rate 123, blood pressure 101/68, SpO2 2 L nasal cannula.  Serum sodium is 132, potassium 4.2, chloride 101, bicarb 23, BUN of 23, serum creatinine of 2.87, EGFR greater than 60, nonfasting blood glucose 154, WBC 8.9, hemoglobin 9.8, platelets of 255.  High sensitive troponin is 33.  Patient tested positive for influenza A PCR.  Per ED report: EMS found patient in SVT with heart rate around 200-205.  Patient was given and is not seen 6 and adenosine 12.  ED treatment: Acetaminophen 650 mg p.o. one-time dose, sodium chloride 500 mL bolus. -------------------------- At bedside, he is able to tell me his name, age, current calendar.  He had slow collapse two times today. He denies head trauma and lost of consciousness and spouse endorses this. He states he just felt really weak.   He denies chest pain. He endorses shortness of breath, 5-6 days ago. He denies known sick contact. He denies diarrhea, nausea, vomiting, abdomianal, dysuria, blood in stool in his stool.   Social history: He lives with his wife. He denies tobacco and recreational drug use. His last etoh drink was 6 days ago. He is retired and formerly worked in [employer].   ROS: Constitutional: no weight change, + fever ENT/Mouth: no sore throat, no rhinorrhea Eyes: no eye pain, no vision changes Cardiovascular: no chest pain,  no dyspnea,  no edema, no palpitations Respiratory: no cough, no sputum, no wheezing Gastrointestinal: no nausea, no vomiting, no diarrhea, no constipation Genitourinary: no urinary incontinence, no dysuria, no hematuria Musculoskeletal: no arthralgias, no myalgias Skin: no skin lesions, no pruritus, Neuro: + weakness, no loss of consciousness, no syncope Psych: no anxiety, no depression, + decrease appetite Heme/Lymph: no bruising, no bleeding  ED Course: Discussed with emergency medicine provider, patient requiring hospitalization for chief concerns of syncope presumed secondary to influenza A and SVT.  Assessment/Plan  Principal Problem:   Syncope Active Problems:   HTN (hypertension)   HLD (hyperlipidemia)   Iron deficiency anemia   Influenza A   Elevated troponin   Assessment and Plan:  * Syncope - Fall precautions - Complete echo ordered  Elevated troponin With positive delta - Etiology workup in progress, suspect secondary to demand ischemia in setting of SVT - Continue to monitor - Complete echo ordered  Influenza A - Portable chest x-ray stat has been ordered - Symptomatic support including acetaminophen as needed  Iron deficiency anemia - Patient had anemia study done outpatient: Serum iron 11 level was 12 and transferrin saturation was 3% - Patient states that he is currently being worked up for his new onset anemia outpatient - Given that iron absorption will require many days for an effect on hemoglobin, I have deferred iron supplementation on admission  HLD (hyperlipidemia) - Pravastatin 40 mg qhs resumed  HTN (hypertension) - Labetalol 5 mg IV every 3 hours as needed for SBP greater than 175, 4 doses ordered  Chart reviewed.   DVT prophylaxis: heparin 5000 u Code Status:  full code Diet: Heart healthy Family Communication: updated spouse at bedside.  Disposition Plan: pending clinical course Consults called: none at this time Admission status:  telemetry cardiac, inpt  Past Medical History:  Diagnosis Date   HLD (hyperlipidemia)    HTN (hypertension)    PSVT (paroxysmal supraventricular tachycardia)    Past Surgical History:  Procedure Laterality Date   CATARACT EXTRACTION, BILATERAL  2010   Social History:  reports that he has quit smoking. He has never used smokeless tobacco. He reports current alcohol use of about 7.0 standard drinks of alcohol per week. He reports that he does not use drugs.  No Known Allergies Family History  Problem Relation Age of Onset   Hypertension Mother    Dementia Mother    Prostate cancer Father    Emphysema Father    Family history: Family history reviewed and not pertinent  Prior to Admission medications   Not on File   Physical Exam: Vitals:   05/17/22 2030 05/17/22 2100 05/17/22 2130 05/17/22 2251  BP: '98/63 97/61 97/65 '$   Pulse: 100 99 (!) 105   Resp: 20  18   Temp:    98.3 F (36.8 C)  TempSrc:    Oral  SpO2: 96% 96% 95%   Weight:      Height:       Constitutional: appears age appropriate, NAD, calm, comfortable Eyes: PERRL, lids and conjunctivae normal ENMT: Mucous membranes are moist. Posterior pharynx clear of any exudate or lesions. Age-appropriate dentition. Hearing appropriate Neck: normal, supple, no masses, no thyromegaly Respiratory: clear to auscultation bilaterally, no wheezing, no crackles. Normal respiratory effort. No accessory muscle use.  Cardiovascular: Regular rate and rhythm, no murmurs / rubs / gallops. No extremity edema. 2+ pedal pulses. No carotid bruits.  Abdomen: no tenderness, no masses palpated, no hepatosplenomegaly. Bowel sounds positive.  Musculoskeletal: no clubbing / cyanosis. No joint deformity upper and lower extremities. Good ROM, no contractures, no atrophy. Normal muscle tone.  Skin: no rashes, lesions, ulcers. No induration Neurologic: Sensation intact. Strength 5/5 in all 4.  Psychiatric: Normal judgment and insight. Alert and  oriented x 3. Normal mood.   EKG: independently reviewed, showing sinus tachycardia with rate of 102, QTc 424  Chest x-ray on Admission: I personally reviewed and I agree with radiologist reading as below.  DG Chest Port 1 View  Result Date: 05/17/2022 CLINICAL DATA:  Syncope EXAM: PORTABLE CHEST 1 VIEW COMPARISON:  None Available. FINDINGS: The heart size and mediastinal contours are within normal limits. Both lungs are clear. The visualized skeletal structures are unremarkable. IMPRESSION: No active disease. Electronically Signed   By: Ronney Asters M.D.   On: 05/17/2022 22:46    Labs on Admission: I have personally reviewed following labs  CBC: Recent Labs  Lab 05/17/22 1847  WBC 8.9  NEUTROABS 7.3  HGB 9.8*  HCT 30.8*  MCV 96.3  PLT 716   Basic Metabolic Panel: Recent Labs  Lab 05/17/22 1847  NA 132*  K 4.0  CL 101  CO2 23  GLUCOSE 154*  BUN 23  CREATININE 0.87  CALCIUM 8.1*   GFR: Estimated Creatinine Clearance: 74.5 mL/min (by C-G formula based on SCr of 0.87 mg/dL).  Liver Function Tests: Recent Labs  Lab 05/17/22 1847  AST 37  ALT 23  ALKPHOS 57  BILITOT 0.4  PROT 6.3*  ALBUMIN 3.1*   CBG: Recent Labs  Lab 05/17/22 2002  GLUCAP 134*   Urine analysis:    Component Value Date/Time  COLORURINE YELLOW (A) 07/11/2020 0046   APPEARANCEUR HAZY (A) 07/11/2020 0046   LABSPEC >1.046 (H) 07/11/2020 0046   PHURINE 5.0 07/11/2020 0046   GLUCOSEU NEGATIVE 07/11/2020 0046   HGBUR NEGATIVE 07/11/2020 0046   BILIRUBINUR NEGATIVE 07/11/2020 0046   KETONESUR 5 (A) 07/11/2020 0046   PROTEINUR NEGATIVE 07/11/2020 0046   NITRITE NEGATIVE 07/11/2020 0046   LEUKOCYTESUR NEGATIVE 07/11/2020 0046   This document was prepared using Dragon Voice Recognition software and may include unintentional dictation errors.  Dr. Tobie Poet [facility]  If 7PM-7AM, please contact overnight-coverage provider If 7AM-7PM, please contact day coverage  provider [email or web address]  05/17/2022, 11:32 PM

## 2022-05-18 ENCOUNTER — Inpatient Hospital Stay (HOSPITAL_COMMUNITY)
Admit: 2022-05-18 | Discharge: 2022-05-18 | Disposition: A | Discharge status: Home / Self Care | Payer: Medicare HMO | Attending: Internal Medicine | Admitting: Internal Medicine

## 2022-05-18 ENCOUNTER — Inpatient Hospital Stay: Payer: Medicare HMO

## 2022-05-18 ENCOUNTER — Encounter
Admission: EM | Disposition: A | Discharge status: Home / Self Care | Payer: Self-pay | Source: Home / Self Care | Attending: Student

## 2022-05-18 ENCOUNTER — Other Ambulatory Visit: Payer: Self-pay

## 2022-05-18 ENCOUNTER — Encounter: Payer: Self-pay | Admitting: Internal Medicine

## 2022-05-18 DIAGNOSIS — K922 Gastrointestinal hemorrhage, unspecified: Secondary | ICD-10-CM | POA: Diagnosis not present

## 2022-05-18 DIAGNOSIS — I10 Essential (primary) hypertension: Secondary | ICD-10-CM

## 2022-05-18 DIAGNOSIS — R578 Other shock: Secondary | ICD-10-CM | POA: Diagnosis not present

## 2022-05-18 DIAGNOSIS — R55 Syncope and collapse: Secondary | ICD-10-CM | POA: Diagnosis not present

## 2022-05-18 DIAGNOSIS — D509 Iron deficiency anemia, unspecified: Secondary | ICD-10-CM

## 2022-05-18 DIAGNOSIS — R1319 Other dysphagia: Secondary | ICD-10-CM | POA: Insufficient documentation

## 2022-05-18 DIAGNOSIS — D508 Other iron deficiency anemias: Secondary | ICD-10-CM

## 2022-05-18 DIAGNOSIS — E785 Hyperlipidemia, unspecified: Secondary | ICD-10-CM

## 2022-05-18 DIAGNOSIS — C155 Malignant neoplasm of lower third of esophagus: Secondary | ICD-10-CM

## 2022-05-18 DIAGNOSIS — I959 Hypotension, unspecified: Secondary | ICD-10-CM | POA: Insufficient documentation

## 2022-05-18 DIAGNOSIS — R7989 Other specified abnormal findings of blood chemistry: Secondary | ICD-10-CM

## 2022-05-18 DIAGNOSIS — R000 Tachycardia, unspecified: Secondary | ICD-10-CM | POA: Insufficient documentation

## 2022-05-18 DIAGNOSIS — J101 Influenza due to other identified influenza virus with other respiratory manifestations: Secondary | ICD-10-CM

## 2022-05-18 DIAGNOSIS — D62 Acute posthemorrhagic anemia: Secondary | ICD-10-CM | POA: Diagnosis not present

## 2022-05-18 DIAGNOSIS — D649 Anemia, unspecified: Secondary | ICD-10-CM | POA: Diagnosis present

## 2022-05-18 HISTORY — PX: ESOPHAGOGASTRODUODENOSCOPY: SHX5428

## 2022-05-18 LAB — ECHOCARDIOGRAM COMPLETE
AR max vel: 4.08 cm2
AV Area VTI: 4.43 cm2
AV Area mean vel: 4.04 cm2
AV Mean grad: 3 mmHg
AV Peak grad: 4.9 mmHg
Ao pk vel: 1.11 m/s
Area-P 1/2: 4.71 cm2
Height: 69 in
S' Lateral: 2.6 cm
Weight: 2800 oz

## 2022-05-18 LAB — BLOOD GAS, ARTERIAL
Acid-base deficit: 4.5 mmol/L — ABNORMAL HIGH (ref 0.0–2.0)
Bicarbonate: 19.2 mmol/L — ABNORMAL LOW (ref 20.0–28.0)
FIO2: 50 %
MECHVT: 500 mL
Mechanical Rate: 15
O2 Saturation: 98.7 %
PEEP: 5 cmH2O
Patient temperature: 37
pCO2 arterial: 31 mmHg — ABNORMAL LOW (ref 32–48)
pH, Arterial: 7.4 (ref 7.35–7.45)
pO2, Arterial: 163 mmHg — ABNORMAL HIGH (ref 83–108)

## 2022-05-18 LAB — URINALYSIS, COMPLETE (UACMP) WITH MICROSCOPIC
Bilirubin Urine: NEGATIVE
Glucose, UA: NEGATIVE mg/dL
Ketones, ur: NEGATIVE mg/dL
Leukocytes,Ua: NEGATIVE
Nitrite: NEGATIVE
Protein, ur: 100 mg/dL — AB
RBC / HPF: >50 RBC/hpf — ABNORMAL HIGH (ref 0–5)
Specific Gravity, Urine: >1.046 — ABNORMAL HIGH (ref 1.005–1.030)
Squamous Epithelial / HPF: NONE SEEN /HPF (ref 0–5)
pH: 5 (ref 5.0–8.0)

## 2022-05-18 LAB — BASIC METABOLIC PANEL
Anion gap: 6 (ref 5–15)
Anion gap: 6 (ref 5–15)
Anion gap: 14 (ref 5–15)
BUN: 31 mg/dL — ABNORMAL HIGH (ref 8–23)
BUN: 37 mg/dL — ABNORMAL HIGH (ref 8–23)
BUN: 41 mg/dL — ABNORMAL HIGH (ref 8–23)
CO2: 24 mmol/L (ref 22–32)
CO2: 20 mmol/L — ABNORMAL LOW (ref 22–32)
CO2: 14 mmol/L — ABNORMAL LOW (ref 22–32)
Calcium: 7.4 mg/dL — ABNORMAL LOW (ref 8.9–10.3)
Calcium: 7 mg/dL — ABNORMAL LOW (ref 8.9–10.3)
Calcium: 6.9 mg/dL — ABNORMAL LOW (ref 8.9–10.3)
Chloride: 105 mmol/L (ref 98–111)
Chloride: 109 mmol/L (ref 98–111)
Chloride: 107 mmol/L (ref 98–111)
Creatinine, Ser: 0.7 mg/dL (ref 0.61–1.24)
Creatinine, Ser: 0.76 mg/dL (ref 0.61–1.24)
Creatinine, Ser: 0.82 mg/dL (ref 0.61–1.24)
GFR, Estimated: >60 mL/min (ref >60)
GFR, Estimated: >60 mL/min (ref >60)
GFR, Estimated: >60 mL/min (ref >60)
Glucose, Bld: 115 mg/dL — ABNORMAL HIGH (ref 70–99)
Glucose, Bld: 175 mg/dL — ABNORMAL HIGH (ref 70–99)
Glucose, Bld: 175 mg/dL — ABNORMAL HIGH (ref 70–99)
Potassium: 4.1 mmol/L (ref 3.5–5.1)
Potassium: 4.3 mmol/L (ref 3.5–5.1)
Potassium: 4.2 mmol/L (ref 3.5–5.1)
Sodium: 135 mmol/L (ref 135–145)
Sodium: 135 mmol/L (ref 135–145)
Sodium: 135 mmol/L (ref 135–145)

## 2022-05-18 LAB — CBC
HCT: 27 % — ABNORMAL LOW (ref 39.0–52.0)
HCT: 22.8 % — ABNORMAL LOW (ref 39.0–52.0)
HCT: 22.8 % — ABNORMAL LOW (ref 39.0–52.0)
Hemoglobin: 8.5 g/dL — ABNORMAL LOW (ref 13.0–17.0)
Hemoglobin: 7.1 g/dL — ABNORMAL LOW (ref 13.0–17.0)
Hemoglobin: 7.4 g/dL — ABNORMAL LOW (ref 13.0–17.0)
MCH: 30.5 pg (ref 26.0–34.0)
MCH: 30.2 pg (ref 26.0–34.0)
MCH: 28.7 pg (ref 26.0–34.0)
MCHC: 31.5 g/dL (ref 30.0–36.0)
MCHC: 31.1 g/dL (ref 30.0–36.0)
MCHC: 32.5 g/dL (ref 30.0–36.0)
MCV: 96.8 fL (ref 80.0–100.0)
MCV: 97 fL (ref 80.0–100.0)
MCV: 88.4 fL (ref 80.0–100.0)
Platelets: 222 10*3/uL (ref 150–400)
Platelets: 192 10*3/uL (ref 150–400)
Platelets: 191 10*3/uL (ref 150–400)
RBC: 2.79 MIL/uL — ABNORMAL LOW (ref 4.22–5.81)
RBC: 2.35 MIL/uL — ABNORMAL LOW (ref 4.22–5.81)
RBC: 2.58 MIL/uL — ABNORMAL LOW (ref 4.22–5.81)
RDW: 12.3 % (ref 11.5–15.5)
RDW: 13.7 % (ref 11.5–15.5)
RDW: 17.1 % — ABNORMAL HIGH (ref 11.5–15.5)
WBC: 5.6 10*3/uL (ref 4.0–10.5)
WBC: 7.9 10*3/uL (ref 4.0–10.5)
WBC: 7.7 10*3/uL (ref 4.0–10.5)
nRBC: 0 % (ref 0.0–0.2)
nRBC: 0 % (ref 0.0–0.2)
nRBC: 0 % (ref 0.0–0.2)

## 2022-05-18 LAB — PREPARE RBC (CROSSMATCH)

## 2022-05-18 LAB — PHOSPHORUS: Phosphorus: 3 mg/dL (ref 2.5–4.6)

## 2022-05-18 LAB — MRSA NEXT GEN BY PCR, NASAL: MRSA by PCR Next Gen: NOT DETECTED

## 2022-05-18 LAB — TROPONIN I (HIGH SENSITIVITY)
Troponin I (High Sensitivity): 72 ng/L — ABNORMAL HIGH (ref <18)
Troponin I (High Sensitivity): 52 ng/L — ABNORMAL HIGH (ref <18)

## 2022-05-18 LAB — BRAIN NATRIURETIC PEPTIDE: B Natriuretic Peptide: 100.2 pg/mL — ABNORMAL HIGH (ref 0.0–100.0)

## 2022-05-18 LAB — FOLATE: Folate: 14.5 ng/mL (ref >5.9)

## 2022-05-18 LAB — GLUCOSE, CAPILLARY: Glucose-Capillary: 163 mg/dL — ABNORMAL HIGH (ref 70–99)

## 2022-05-18 LAB — PROTIME-INR
INR: 1.4 — ABNORMAL HIGH (ref 0.8–1.2)
Prothrombin Time: 17.4 seconds — ABNORMAL HIGH (ref 11.4–15.2)

## 2022-05-18 LAB — MAGNESIUM: Magnesium: 1.7 mg/dL (ref 1.7–2.4)

## 2022-05-18 LAB — FIBRINOGEN: Fibrinogen: 271 mg/dL (ref 210–475)

## 2022-05-18 LAB — CBG MONITORING, ED: Glucose-Capillary: 153 mg/dL — ABNORMAL HIGH (ref 70–99)

## 2022-05-18 LAB — APTT: aPTT: 37 seconds — ABNORMAL HIGH (ref 24–36)

## 2022-05-18 LAB — LACTIC ACID, PLASMA: Lactic Acid, Venous: 6.5 mmol/L (ref 0.5–1.9)

## 2022-05-18 SURGERY — EGD (ESOPHAGOGASTRODUODENOSCOPY)

## 2022-05-18 MED ORDER — MIDAZOLAM HCL 2 MG/2ML IJ SOLN
INTRAMUSCULAR | Status: AC
  Filled 2022-05-18: qty 4

## 2022-05-18 MED ORDER — FENTANYL BOLUS VIA INFUSION: 25.0000 ug | INTRAVENOUS | Status: DC | PRN

## 2022-05-18 MED ORDER — PHENYLEPHRINE HCL-NACL 20-0.9 MG/250ML-% IV SOLN
25.0000 ug/min | INTRAVENOUS | Status: DC
  Administered 2022-05-18: 25 ug/min via INTRAVENOUS
  Filled 2022-05-18: qty 250

## 2022-05-18 MED ORDER — DOCUSATE SODIUM 50 MG/5ML PO LIQD: 100.0000 mg | Freq: Two times a day (BID) | ORAL | Status: DC

## 2022-05-18 MED ORDER — SODIUM CHLORIDE 0.9 % IV BOLUS
1000.0000 mL | Freq: Once | INTRAVENOUS | Status: AC
  Administered 2022-05-18: 1000 mL via INTRAVENOUS

## 2022-05-18 MED ORDER — METOPROLOL TARTRATE 5 MG/5ML IV SOLN
INTRAVENOUS | Status: AC
  Filled 2022-05-18: qty 5

## 2022-05-18 MED ORDER — ETOMIDATE 2 MG/ML IV SOLN
INTRAVENOUS | Status: AC
  Filled 2022-05-18: qty 10

## 2022-05-18 MED ORDER — FENTANYL CITRATE (PF) 100 MCG/2ML IJ SOLN
100.0000 ug | Freq: Once | INTRAMUSCULAR | Status: AC
  Administered 2022-05-18: 100 ug via INTRAVENOUS

## 2022-05-18 MED ORDER — MAGNESIUM SULFATE 2 GM/50ML IV SOLN
2.0000 g | Freq: Once | INTRAVENOUS | Status: AC
  Administered 2022-05-19: 2 g via INTRAVENOUS
  Filled 2022-05-18: qty 50

## 2022-05-18 MED ORDER — PANTOPRAZOLE INFUSION (NEW) - SIMPLE MED
8.0000 mg/h | INTRAVENOUS | Status: AC
  Administered 2022-05-18 – 2022-05-21 (×7): 8 mg/h via INTRAVENOUS
  Filled 2022-05-18 (×7): qty 100

## 2022-05-18 MED ORDER — LACTATED RINGERS IV SOLN: INTRAVENOUS | Status: DC

## 2022-05-18 MED ORDER — METOPROLOL TARTRATE 5 MG/5ML IV SOLN: 5.0000 mg | Freq: Once | INTRAVENOUS | Status: DC

## 2022-05-18 MED ORDER — FENTANYL 2500MCG IN NS 250ML (10MCG/ML) PREMIX INFUSION
INTRAVENOUS | Status: AC
  Filled 2022-05-18: qty 250

## 2022-05-18 MED ORDER — PRAVASTATIN SODIUM 20 MG PO TABS
40.0000 mg | ORAL_TABLET | Freq: Every day | ORAL | Status: DC
  Administered 2022-05-20 – 2022-05-23 (×4): 40 mg
  Filled 2022-05-18: qty 2
  Filled 2022-05-18: qty 1
  Filled 2022-05-18: qty 2
  Filled 2022-05-18: qty 1

## 2022-05-18 MED ORDER — SODIUM CHLORIDE (PF) 0.9 % IJ SOLN
PREFILLED_SYRINGE | INTRAMUSCULAR | Status: DC | PRN
  Administered 2022-05-18: 4 mL

## 2022-05-18 MED ORDER — PANTOPRAZOLE 80MG IVPB - SIMPLE MED
80.0000 mg | Freq: Once | INTRAVENOUS | Status: AC
  Administered 2022-05-18: 80 mg via INTRAVENOUS
  Filled 2022-05-18: qty 100

## 2022-05-18 MED ORDER — FENTANYL 2500MCG IN NS 250ML (10MCG/ML) PREMIX INFUSION
25.0000 ug/h | INTRAVENOUS | Status: DC
  Administered 2022-05-18: 150 ug/h via INTRAVENOUS

## 2022-05-18 MED ORDER — ONDANSETRON HCL 4 MG PO TABS: 4.0000 mg | ORAL_TABLET | Freq: Four times a day (QID) | ORAL | Status: DC | PRN

## 2022-05-18 MED ORDER — MIDAZOLAM HCL (PF) 5 MG/ML IJ SOLN
4.0000 mg | Freq: Once | INTRAMUSCULAR | Status: AC
  Administered 2022-05-18: 4 mg via INTRAVENOUS

## 2022-05-18 MED ORDER — LACTATED RINGERS IV BOLUS
1000.0000 mL | Freq: Once | INTRAVENOUS | Status: AC
  Administered 2022-05-18: 1000 mL via INTRAVENOUS

## 2022-05-18 MED ORDER — POLYETHYLENE GLYCOL 3350 17 G PO PACK: 17.0000 g | PACK | Freq: Every day | ORAL | Status: DC

## 2022-05-18 MED ORDER — PROPOFOL 1000 MG/100ML IV EMUL
INTRAVENOUS | Status: AC
  Filled 2022-05-18: qty 100

## 2022-05-18 MED ORDER — SODIUM CHLORIDE 0.9% IV SOLUTION
Freq: Once | INTRAVENOUS | Status: AC
  Filled 2022-05-18: qty 250

## 2022-05-18 MED ORDER — SODIUM CHLORIDE 0.9% IV SOLUTION: Freq: Once | INTRAVENOUS | Status: AC

## 2022-05-18 MED ORDER — ETOMIDATE 2 MG/ML IV SOLN
20.0000 mg | Freq: Once | INTRAVENOUS | Status: AC
  Administered 2022-05-18: 20 mg via INTRAVENOUS

## 2022-05-18 MED ORDER — FENTANYL CITRATE PF 50 MCG/ML IJ SOSY
25.0000 ug | PREFILLED_SYRINGE | Freq: Once | INTRAMUSCULAR | Status: AC
  Administered 2022-05-18: 25 ug via INTRAVENOUS

## 2022-05-18 MED ORDER — ACETAMINOPHEN 650 MG RE SUPP: 650.0000 mg | Freq: Four times a day (QID) | RECTAL | Status: DC | PRN

## 2022-05-18 MED ORDER — ORAL CARE MOUTH RINSE: 15.0000 mL | OROMUCOSAL | Status: DC | PRN

## 2022-05-18 MED ORDER — ORAL CARE MOUTH RINSE
15.0000 mL | OROMUCOSAL | Status: DC
  Administered 2022-05-19 (×7): 15 mL via OROMUCOSAL

## 2022-05-18 MED ORDER — PHENYLEPHRINE CONCENTRATED 100MG/250ML (0.4 MG/ML) INFUSION SIMPLE
0.0000 ug/min | INTRAVENOUS | Status: DC
  Administered 2022-05-18 – 2022-05-19 (×2): 200 ug/min via INTRAVENOUS
  Filled 2022-05-18 (×3): qty 250

## 2022-05-18 MED ORDER — VECURONIUM BROMIDE 10 MG IV SOLR: 10.0000 mg | Freq: Once | INTRAVENOUS | Status: DC

## 2022-05-18 MED ORDER — MIDAZOLAM HCL 2 MG/2ML IJ SOLN
1.0000 mg | INTRAMUSCULAR | Status: DC | PRN
  Administered 2022-05-18: 2 mg via INTRAVENOUS

## 2022-05-18 MED ORDER — METOCLOPRAMIDE HCL 5 MG/ML IJ SOLN
10.0000 mg | Freq: Once | INTRAMUSCULAR | Status: AC
  Administered 2022-05-19: 10 mg via INTRAVENOUS
  Filled 2022-05-18: qty 2

## 2022-05-18 MED ORDER — ACETAMINOPHEN 325 MG PO TABS
650.0000 mg | ORAL_TABLET | Freq: Four times a day (QID) | ORAL | Status: DC | PRN
  Administered 2022-05-19: 650 mg
  Filled 2022-05-18: qty 2

## 2022-05-18 MED ORDER — SODIUM BICARBONATE 8.4 % IV SOLN
50.0000 meq | Freq: Once | INTRAVENOUS | Status: AC
  Administered 2022-05-18: 50 meq via INTRAVENOUS
  Filled 2022-05-18: qty 50

## 2022-05-18 MED ORDER — SODIUM CHLORIDE 0.9 % IV SOLN
50.0000 ug/h | INTRAVENOUS | Status: DC
  Filled 2022-05-18: qty 1

## 2022-05-18 MED ORDER — SODIUM CHLORIDE 0.9 % IV SOLN
250.0000 mL | INTRAVENOUS | Status: DC
  Administered 2022-05-18 – 2022-05-22 (×5): 250 mL via INTRAVENOUS

## 2022-05-18 MED ORDER — ONDANSETRON HCL 4 MG/2ML IJ SOLN: 4.0000 mg | Freq: Four times a day (QID) | INTRAMUSCULAR | Status: DC | PRN

## 2022-05-18 MED ORDER — CALCIUM GLUCONATE-NACL 1-0.675 GM/50ML-% IV SOLN
1.0000 g | Freq: Once | INTRAVENOUS | Status: AC
  Administered 2022-05-18: 1000 mg via INTRAVENOUS
  Filled 2022-05-18: qty 50

## 2022-05-18 MED ORDER — VASOPRESSIN 20 UNITS/100 ML INFUSION FOR SHOCK
0.0000 [IU]/min | INTRAVENOUS | Status: DC
  Administered 2022-05-18 – 2022-05-19 (×2): 0.04 [IU]/min via INTRAVENOUS
  Filled 2022-05-18 (×2): qty 100

## 2022-05-18 MED ORDER — CHLORHEXIDINE GLUCONATE CLOTH 2 % EX PADS
6.0000 | MEDICATED_PAD | Freq: Every day | CUTANEOUS | Status: DC
  Administered 2022-05-18 – 2022-05-22 (×4): 6 via TOPICAL

## 2022-05-18 MED ORDER — MIDAZOLAM HCL 2 MG/2ML IJ SOLN
INTRAMUSCULAR | Status: AC
  Filled 2022-05-18: qty 2

## 2022-05-18 MED ORDER — IOHEXOL 350 MG/ML SOLN
100.0000 mL | Freq: Once | INTRAVENOUS | Status: AC | PRN
  Administered 2022-05-18: 100 mL via INTRAVENOUS

## 2022-05-18 MED ORDER — EPINEPHRINE 1 MG/10ML IJ SOSY
PREFILLED_SYRINGE | INTRAMUSCULAR | Status: AC
  Filled 2022-05-18: qty 10

## 2022-05-18 MED ORDER — VECURONIUM BROMIDE 10 MG IV SOLR
INTRAVENOUS | Status: AC
  Filled 2022-05-18: qty 10

## 2022-05-18 MED ORDER — LACTATED RINGERS IV BOLUS
500.0000 mL | Freq: Once | INTRAVENOUS | Status: AC
  Administered 2022-05-18: 500 mL via INTRAVENOUS

## 2022-05-18 MED ORDER — FENTANYL CITRATE (PF) 100 MCG/2ML IJ SOLN
INTRAMUSCULAR | Status: AC
  Filled 2022-05-18: qty 2

## 2022-05-18 MED ORDER — PANTOPRAZOLE SODIUM 40 MG IV SOLR
40.0000 mg | Freq: Two times a day (BID) | INTRAVENOUS | Status: DC
  Administered 2022-05-22 – 2022-05-23 (×3): 40 mg via INTRAVENOUS
  Filled 2022-05-18 (×3): qty 10

## 2022-05-18 MED ORDER — PROPOFOL 1000 MG/100ML IV EMUL
5.0000 ug/kg/min | INTRAVENOUS | Status: DC
  Administered 2022-05-18: 25 ug/kg/min via INTRAVENOUS
  Administered 2022-05-19: 15 ug/kg/min via INTRAVENOUS
  Filled 2022-05-18: qty 100

## 2022-05-18 NOTE — Procedures (Signed)
Central Venous Catheter Insertion Procedure Note  Rahul Malinak  [id number]  05-14-1945  Date:05/18/22  Time:9:30 PM   Provider Performing:Eidan Muellner L [place]   Procedure: Insertion of Non-tunneled Central Venous [phone]) with US guidance ([id number])   Indication(s) Medication administration  Consent Risks of the procedure as well as the alternatives and risks of each were explained to the patient and/or caregiver.  Consent for the procedure was obtained and is signed in the bedside chart  Anesthesia Topical only with 1% lidocaine  & versed  Timeout Verified patient identification, verified procedure, site/side was marked, verified correct patient position, special equipment/implants available, medications/allergies/relevant history reviewed, required imaging and test results available.  Sterile Technique Maximal sterile technique including full sterile barrier drape, hand hygiene, sterile gown, sterile gloves, mask, hair covering, sterile ultrasound probe cover (if used).  Procedure Description Area of catheter insertion was cleaned with chlorhexidine and draped in sterile fashion.  With real-time ultrasound guidance a central venous catheter was placed into the left internal jugular vein. Nonpulsatile blood flow and easy flushing noted in all ports.  The catheter was sutured in place and sterile dressing applied.  Complications/Tolerance None; patient tolerated the procedure well. Chest X-ray is ordered to verify placement for internal jugular or subclavian cannulation.   Chest x-ray is not ordered for femoral cannulation.  EBL Minimal  Specimen(s) None  Venetia Night, AGACNP-BC Acute Care Nurse Practitioner [facility]   [phone] / [phone] Please see Amion for pager details.

## 2022-05-18 NOTE — Progress Notes (Signed)
Cephas Darby, MD [address]  [place]  [place], [place] [address]  Main: [phone]  Fax: [phone] Pager: [phone]   Subjective: I was informed about patient becoming hemodynamically unstable, hypotensive and tachycardic and being transferred to ICU this evening.  He had 3 large black tarry stools during the day and his hemoglobin dropped from 8.5 to 7.1. It was 9.8 on admission yesterday.  Patient is currently in the ICU, on pressor support, received 2 units of PRBC so far.  He underwent CT angio tonight which revealed active bleeding in the proximal stomach.  I have requested the ICU team to intubate the patient in anticipation of performing urgent upper endoscopy given abnormal CT angio findings.  Patient's wife is in the waiting room who stated that since Thanksgiving, patient has been having trouble swallowing solids and he never had this issue before.  Denies any NSAID use including BC powder or Goody powder   Objective: Vital signs in last 24 hours: Vitals:   05/18/22 2115 05/18/22 2130 05/18/22 2143 05/18/22 2145  BP: (!) 88/55 106/60  (!) 98/57  Pulse: (!) 110 (!) 113  (!) 131  Resp: (!) 23 (!) 21  (!) 23  Temp:   98.5 F (36.9 C)   TempSrc:   Axillary   SpO2: 100% 100%  100%  Weight:      Height:       Weight change:   Intake/Output Summary (Last 24 hours) at 05/18/2022 2210 Last data filed at 05/18/2022 1930 Gross per 24 hour  Intake 2770.83 ml  Output --  Net 2770.83 ml     Exam: Heart:: Tachycardia Lungs: clear to auscultation Abdomen: soft, nontender, normal bowel sounds   Lab Results:    Latest Ref Rng & Units 05/18/2022    8:37 PM 05/18/2022    4:23 PM 05/18/2022    4:05 AM  CBC  WBC 4.0 - 10.5 K/uL 7.7  7.9  5.6   Hemoglobin 13.0 - 17.0 g/dL 7.4  7.1  8.5   Hematocrit 39.0 - 52.0 % 22.8  22.8  27.0   Platelets 150 - 400 K/uL 191  192  222       Latest Ref Rng & Units 05/18/2022    8:37 PM 05/18/2022    4:23 PM 05/18/2022     4:05 AM  CMP  Glucose 70 - 99 mg/dL 175  175  115   BUN 8 - 23 mg/dL 41  37  31   Creatinine 0.61 - 1.24 mg/dL 0.82  0.76  0.70   Sodium 135 - 145 mmol/L 135  135  135   Potassium 3.5 - 5.1 mmol/L 4.2  4.3  4.1   Chloride 98 - 111 mmol/L 107  109  105   CO2 22 - 32 mmol/L '14  20  24   '$ Calcium 8.9 - 10.3 mg/dL 6.9  7.0  7.4     Micro Results: Recent Results (from the past 240 hour(s))  Resp panel by RT-PCR (RSV, Flu A&B, Covid) Anterior Nasal Swab     Status: Abnormal   Collection Time: 05/17/22  7:53 PM   Specimen: Anterior Nasal Swab  Result Value Ref Range Status   SARS Coronavirus 2 by RT PCR NEGATIVE NEGATIVE Final    Comment: (NOTE) SARS-CoV-2 target nucleic acids are NOT DETECTED.  The SARS-CoV-2 RNA is generally detectable in upper respiratory specimens during the acute phase of infection. The lowest concentration of SARS-CoV-2 viral copies this assay  can detect is 138 copies/mL. A negative result does not preclude SARS-Cov-2 infection and should not be used as the sole basis for treatment or other patient management decisions. A negative result may occur with  improper specimen collection/handling, submission of specimen other than nasopharyngeal swab, presence of viral mutation(s) within the areas targeted by this assay, and inadequate number of viral copies(<138 copies/mL). A negative result must be combined with clinical observations, patient history, and epidemiological information. The expected result is Negative.  Fact Sheet for Patients:  EntrepreneurPulse.com.au  Fact Sheet for Healthcare Providers:  IncredibleEmployment.be  This test is no t yet approved or cleared by the [place] FDA and  has been authorized for detection and/or diagnosis of SARS-CoV-2 by FDA under an Emergency Use Authorization (EUA). This EUA will remain  in effect (meaning this test can be used) for the duration of the COVID-19 declaration under  Section 564(b)(1) of the Act, 21 U.S.C.section 360bbb-3(b)(1), unless the authorization is terminated  or revoked sooner.       Influenza A by PCR POSITIVE (A) NEGATIVE Final   Influenza B by PCR NEGATIVE NEGATIVE Final    Comment: (NOTE) The Xpert Xpress SARS-CoV-2/FLU/RSV plus assay is intended as an aid in the diagnosis of influenza from Nasopharyngeal swab specimens and should not be used as a sole basis for treatment. Nasal washings and aspirates are unacceptable for Xpert Xpress SARS-CoV-2/FLU/RSV testing.  Fact Sheet for Patients: EntrepreneurPulse.com.au  Fact Sheet for Healthcare Providers: IncredibleEmployment.be  This test is not yet approved or cleared by the [place] FDA and has been authorized for detection and/or diagnosis of SARS-CoV-2 by FDA under an Emergency Use Authorization (EUA). This EUA will remain in effect (meaning this test can be used) for the duration of the COVID-19 declaration under Section 564(b)(1) of the Act, 21 U.S.C. section 360bbb-3(b)(1), unless the authorization is terminated or revoked.     Resp Syncytial Virus by PCR NEGATIVE NEGATIVE Final    Comment: (NOTE) Fact Sheet for Patients: EntrepreneurPulse.com.au  Fact Sheet for Healthcare Providers: IncredibleEmployment.be  This test is not yet approved or cleared by the [place] FDA and has been authorized for detection and/or diagnosis of SARS-CoV-2 by FDA under an Emergency Use Authorization (EUA). This EUA will remain in effect (meaning this test can be used) for the duration of the COVID-19 declaration under Section 564(b)(1) of the Act, 21 U.S.C. section 360bbb-3(b)(1), unless the authorization is terminated or revoked.  Performed at [facility], [address] [place], [place] [address]   MRSA Next Gen by PCR, Nasal     Status: None   Collection Time: 05/18/22  5:16 PM   Specimen:  Nasal Mucosa; Nasal Swab  Result Value Ref Range Status   MRSA by PCR Next Gen NOT DETECTED NOT DETECTED Final    Comment: (NOTE) The GeneXpert MRSA Assay (FDA approved for NASAL specimens only), is one component of a comprehensive MRSA colonization surveillance program. It is not intended to diagnose MRSA infection nor to guide or monitor treatment for MRSA infections. Test performance is not FDA approved in patients less than 82 years old. Performed at [facility], [address], [place], [place] [address]    Studies/Results: [facility] Chest Port 1 View  Result Date: 05/18/2022 CLINICAL DATA:  Status post central line placement. EXAM: PORTABLE CHEST 1 VIEW COMPARISON:  May 17, 2022 FINDINGS: Endotracheal tube is seen with its distal tip approximately 5.5 cm from the carina. A left internal jugular venous catheter is  noted with its distal tip near the junction of the superior vena cava and right atrium. The heart size and mediastinal contours are within normal limits. Mild bilateral infrahilar atelectasis is noted. There is no evidence of a pleural effusion or pneumothorax. The visualized skeletal structures are unremarkable. IMPRESSION: 1. Endotracheal tube and left internal jugular venous catheter positioning, as described above. 2. Mild bilateral infrahilar atelectasis. Electronically Signed   By: Virgina Norfolk M.D.   On: 05/18/2022 21:55   CT ANGIO GI BLEED  Result Date: 05/18/2022 CLINICAL DATA:  GI bleeding. EXAM: CTA ABDOMEN AND PELVIS WITHOUT AND WITH CONTRAST TECHNIQUE: Multidetector CT imaging of the abdomen and pelvis was performed using the standard protocol during bolus administration of intravenous contrast. Multiplanar reconstructed images and MIPs were obtained and reviewed to evaluate the vascular anatomy. RADIATION DOSE REDUCTION: This exam was performed according to the departmental dose-optimization program which includes automated exposure control, adjustment of  the mA and/or kV according to patient size and/or use of iterative reconstruction technique. CONTRAST:  172m OMNIPAQUE IOHEXOL 350 MG/ML SOLN COMPARISON:  CT abdomen and pelvis 07/10/2020 FINDINGS: VASCULAR Aorta: Normal caliber aorta without aneurysm, dissection, vasculitis or significant stenosis. Are atherosclerotic calcifications of the aorta. Celiac: Patent without evidence of aneurysm, dissection, vasculitis or significant stenosis. SMA: Patent without evidence of aneurysm, dissection, vasculitis or significant stenosis. There is mild noncalcified atherosclerotic disease in the proximal SMA. Renals: Both renal arteries are patent without evidence of aneurysm, dissection, vasculitis, fibromuscular dysplasia or significant stenosis. Accessory left renal artery present. IMA: Patent without evidence of aneurysm, dissection, vasculitis or significant stenosis. Inflow: Patent without evidence of aneurysm, dissection, vasculitis or significant stenosis. Proximal Outflow: Not well opacified secondary to timing of the contrast bolus. Veins: No obvious venous abnormality within the limitations of this arterial phase study. Review of the MIP images confirms the above findings. NON-VASCULAR Lower chest: There is peribronchial wall thickening and plugging in the right lower lobe with a small amount of airspace disease. Hepatobiliary: There is a 4.8 x 4.1 cm cyst in the inferior right liver. There is an indeterminate rim enhancing lesion in the left lobe of the liver measuring 15 mm seen on image 17/24. There is a second similar appearing lesion in the left lobe of the liver measuring 12 mm image 17/30. Gallbladder and bile ducts are within normal limits. Pancreas: Unremarkable. No pancreatic ductal dilatation or surrounding inflammatory changes. Spleen: Normal in size without focal abnormality. Adrenals/Urinary Tract: Are 2 left renal calculi measuring up 2 9 mm. There is left renal cortical scarring. There is no  hydronephrosis in either kidney. There are scattered subcentimeter cortical cysts in both kidneys. The adrenal glands and bladder are within normal limits. Stomach/Bowel: The stomach is moderately distended. There is an area of active bleeding seen within the proximal stomach near the gastroesophageal junction. Appendix appears normal. No evidence of bowel wall thickening, distention, or inflammatory changes. The appendix is not visualized. Lymphatic: No enlarged lymph nodes are seen. Reproductive: Prostate gland is enlarged. Right-sided hydrocele present. Other: No abdominal wall hernia or abnormality. No abdominopelvic ascites. Musculoskeletal: There is moderate compression deformity of L2 which appears unchanged. IMPRESSION: 1. Active bleeding in the proximal stomach near the gastroesophageal junction. Aortic Atherosclerosis (ICD10-I70.0). NON-VASCULAR 1. Moderate distention of the stomach. 2. New indeterminate rim enhancing lesions in the left lobe of the liver. Recommend further evaluation with MRI. 3. Nonobstructing left renal calculi. 4. Prostatomegaly. 5. Right-sided hydrocele. These results were called by telephone at the time of  interpretation on 05/18/2022 at 8:30 pm to provider BRITTON RUST-CHESTER , who verbally acknowledged these results. Electronically Signed   By: Ronney Asters M.D.   On: 05/18/2022 20:33   ECHOCARDIOGRAM COMPLETE  Result Date: 05/18/2022    ECHOCARDIOGRAM REPORT   Patient Name:   Bobby Moreno Date of Exam: 05/18/2022 Medical Rec #:  [id number]   Height:       69.0 in Accession #:    [id number]  Weight:       175.0 lb Date of Birth:  [birth year]   BSA:          1.952 m Patient Age:    77 years    BP:           121/76 mmHg Patient Gender: M           HR:           101 bpm. Exam Location:  [facility] Procedure: 2D Echo, Cardiac Doppler and Color Doppler Indications:     Syncope R55                  Elevated troponin  History:         Patient has no prior history of Echocardiogram  examinations.                  Risk Factors:Hypertension and Dyslipidemia. PSVT.  Sonographer:     Sherrie Sport Referring Phys:  [id number] AMY N COX Diagnosing Phys: Kate Sable MD  Sonographer Comments: Suboptimal apical window and no subcostal window. IMPRESSIONS  1. Left ventricular ejection fraction, by estimation, is 65 to 70%. The left ventricle has normal function. The left ventricle has no regional wall motion abnormalities. There is mild left ventricular hypertrophy. Left ventricular diastolic parameters are consistent with Grade I diastolic dysfunction (impaired relaxation).  2. Right ventricular systolic function is normal. The right ventricular size is normal.  3. The mitral valve is normal in structure. No evidence of mitral valve regurgitation.  4. The aortic valve is tricuspid. Aortic valve regurgitation is mild. Aortic valve sclerosis/calcification is present, without any evidence of aortic stenosis.  5. Aortic dilatation noted. There is mild dilatation of the ascending aorta, measuring 42 mm. There is mild dilatation of the aortic root, measuring 39 mm. FINDINGS  Left Ventricle: Left ventricular ejection fraction, by estimation, is 65 to 70%. The left ventricle has normal function. The left ventricle has no regional wall motion abnormalities. The left ventricular internal cavity size was normal in size. There is  mild left ventricular hypertrophy. Left ventricular diastolic parameters are consistent with Grade I diastolic dysfunction (impaired relaxation). Right Ventricle: The right ventricular size is normal. No increase in right ventricular wall thickness. Right ventricular systolic function is normal. Left Atrium: Left atrial size was normal in size. Right Atrium: Right atrial size was normal in size. Pericardium: There is no evidence of pericardial effusion. Mitral Valve: The mitral valve is normal in structure. No evidence of mitral valve regurgitation. Tricuspid Valve: The tricuspid valve is  normal in structure. Tricuspid valve regurgitation is trivial. Aortic Valve: The aortic valve is tricuspid. Aortic valve regurgitation is mild. Aortic valve sclerosis/calcification is present, without any evidence of aortic stenosis. Aortic valve mean gradient measures 3.0 mmHg. Aortic valve peak gradient measures 4.9 mmHg. Aortic valve area, by VTI measures 4.43 cm. Pulmonic Valve: The pulmonic valve was not well visualized. Pulmonic valve regurgitation is not visualized. Aorta: Aortic dilatation noted. There is mild dilatation of the ascending aorta, measuring 42  mm. There is mild dilatation of the aortic root, measuring 39 mm. Venous: The inferior vena cava was not well visualized. IAS/Shunts: No atrial level shunt detected by color flow Doppler.  LEFT VENTRICLE PLAX 2D LVIDd:         3.80 cm   Diastology LVIDs:         2.60 cm   LV e' medial:    9.90 cm/s LV PW:         1.20 cm   LV E/e' medial:  7.0 LV IVS:        1.20 cm   LV e' lateral:   10.60 cm/s LVOT diam:     2.20 cm   LV E/e' lateral: 6.6 LV SV:         67 LV SV Index:   34 LVOT Area:     3.80 cm  RIGHT VENTRICLE RV Basal diam:  2.70 cm RV Mid diam:    2.40 cm RV S prime:     18.00 cm/s TAPSE (M-mode): 2.0 cm LEFT ATRIUM             Index        RIGHT ATRIUM           Index LA diam:        3.40 cm 1.74 cm/m   RA Area:     16.20 cm LA Vol (A2C):   51.2 ml 26.23 ml/m  RA Volume:   41.20 ml  21.11 ml/m LA Vol (A4C):   36.9 ml 18.90 ml/m LA Biplane Vol: 46.6 ml 23.87 ml/m  AORTIC VALVE AV Area (Vmax):    4.08 cm AV Area (Vmean):   4.04 cm AV Area (VTI):     4.43 cm AV Vmax:           111.00 cm/s AV Vmean:          77.000 cm/s AV VTI:            0.152 m AV Peak Grad:      4.9 mmHg AV Mean Grad:      3.0 mmHg LVOT Vmax:         119.00 cm/s LVOT Vmean:        81.800 cm/s LVOT VTI:          0.177 m LVOT/AV VTI ratio: 1.16  AORTA Ao Root diam: 3.94 cm MITRAL VALVE                TRICUSPID VALVE MV Area (PHT): 4.71 cm     TR Peak grad:   12.4 mmHg MV  Decel Time: 161 msec     TR Vmax:        176.00 cm/s MV E velocity: 69.70 cm/s MV A velocity: 110.00 cm/s  SHUNTS MV E/A ratio:  0.63         Systemic VTI:  0.18 m                             Systemic Diam: 2.20 cm Kate Sable MD Electronically signed by Kate Sable MD Signature Date/Time: 05/18/2022/2:48:12 PM    Final    DG Chest Port 1 View  Result Date: 05/17/2022 CLINICAL DATA:  Syncope EXAM: PORTABLE CHEST 1 VIEW COMPARISON:  None Available. FINDINGS: The heart size and mediastinal contours are within normal limits. Both lungs are clear. The visualized skeletal structures are unremarkable. IMPRESSION: No active disease. Electronically Signed   By: Warren Lacy  Dagoberto Reef M.D.   On: 05/17/2022 22:46   Medications: I have reviewed the patient's current medications. Prior to Admission:  Medications Prior to Admission  Medication Sig Dispense Refill Last Dose   metoprolol succinate (TOPROL-XL) 50 MG 24 hr tablet Take 50 mg by mouth daily.   05/16/2022   pravastatin (PRAVACHOL) 40 MG tablet Take 40 mg by mouth at bedtime.   05/16/2022   nitroGLYCERIN (NITROSTAT) 0.4 MG SL tablet Place 0.4 mg under the tongue every 5 (five) minutes as needed for chest pain (May take up to 3 doses.).   Unknown at prn   Scheduled:  sodium chloride   Intravenous Once   Chlorhexidine Gluconate Cloth  6 each Topical Daily   docusate  100 mg Per Tube BID   etomidate       fentaNYL       fentaNYL (SUBLIMAZE) injection  25 mcg Intravenous Once   midazolam       midazolam       [START ON 05/19/2022] mouth rinse  15 mL Mouth Rinse Q2H   [START ON 05/22/2022] pantoprazole  40 mg Intravenous Q12H   polyethylene glycol  17 g Per Tube Daily   pravastatin  40 mg Per Tube QHS   vecuronium       Continuous:  sodium chloride 250 mL (05/18/22 1936)   calcium gluconate     fentaNYL infusion INTRAVENOUS 150 mcg/hr (05/18/22 2120)   lactated ringers 100 mL/hr at 05/18/22 2207   magnesium sulfate bolus IVPB     pantoprazole 8  mg/hr (05/18/22 1932)   phenylephrine (NEO-SYNEPHRINE) Adult infusion     ERD:EYCXKGYJEHUDJ **OR** acetaminophen, etomidate, fentaNYL, fentaNYL, midazolam, midazolam, midazolam, ondansetron **OR** ondansetron (ZOFRAN) IV, mouth rinse, senna-docusate, vecuronium Anti-infectives (From admission, onward)    None      Scheduled Meds:  sodium chloride   Intravenous Once   Chlorhexidine Gluconate Cloth  6 each Topical Daily   docusate  100 mg Per Tube BID   etomidate       fentaNYL       fentaNYL (SUBLIMAZE) injection  25 mcg Intravenous Once   midazolam       midazolam       [START ON 05/19/2022] mouth rinse  15 mL Mouth Rinse Q2H   [START ON 05/22/2022] pantoprazole  40 mg Intravenous Q12H   polyethylene glycol  17 g Per Tube Daily   pravastatin  40 mg Per Tube QHS   vecuronium       Continuous Infusions:  sodium chloride 250 mL (05/18/22 1936)   calcium gluconate     fentaNYL infusion INTRAVENOUS 150 mcg/hr (05/18/22 2120)   lactated ringers 100 mL/hr at 05/18/22 2207   magnesium sulfate bolus IVPB     pantoprazole 8 mg/hr (05/18/22 1932)   phenylephrine (NEO-SYNEPHRINE) Adult infusion     PRN Meds:.acetaminophen **OR** acetaminophen, etomidate, fentaNYL, fentaNYL, midazolam, midazolam, midazolam, ondansetron **OR** ondansetron (ZOFRAN) IV, mouth rinse, senna-docusate, vecuronium   Assessment: Principal Problem:   Acute blood loss anemia (ABLA) Active Problems:   HTN (hypertension)   HLD (hyperlipidemia)   Near syncope   Iron deficiency anemia   Influenza A   Elevated troponin   Other dysphagia   Symptomatic anemia   GI bleed   Hypotension   Sinus tachycardia by electrocardiogram  77 year old male with history of hypertension, hyperlipidemia, paroxysmal SVT presented with syncope, found to have influenza A positive with no evidence of pneumonia, known history of iron deficiency anemia is now found to have  worsening anemia with melena  Plan: Melena, acute blood loss  anemia, hemorrhagic shock Elevated BUN/creatinine and CT angio positive for active bleeding in the proximal stomach near the GE junction Continue pantoprazole drip Continue aggressive resuscitation with pressor support and blood transfusion as needed Will proceed with urgent EGD given active bleeding source identified on the CT angio.  Location of the bleeding is not amenable for vascular embolization.  Therefore, we will proceed with EGD first Monitor CBC closely to maintain hemoglobin greater than 8  I have discussed alternative options, risks & benefits,  which include, but are not limited to, bleeding, infection, perforation,respiratory complication & drug reaction.  The patient agrees with this plan & written consent will be obtained.    Patient is critically ill, overall prognosis is guarded   LOS: 1 day   Bita Cartwright 05/18/2022, 10:10 PM

## 2022-05-18 NOTE — Progress Notes (Signed)
*  PRELIMINARY RESULTS* Echocardiogram 2D Echocardiogram has been performed.  Sherrie Sport 05/18/2022, 8:39 AM

## 2022-05-18 NOTE — Consult Note (Signed)
NAME:  Bobby Moreno, MRN:  [id number], DOB:  June 29, 1945, LOS: 1 ADMISSION DATE:  05/17/2022, CONSULTATION DATE: 05/18/22 REFERRING MD: Dr. Sheppard Coil, CHIEF COMPLAINT: GI Bleed    History of Present Illness:  This is a 77 yo male with a hx of Paroxysmal SVT, HTN, and Anemia.  He presented to [facility] ER via EMS with palpitations.  Upon EMS arrival at pts home he was found to be in SVT and hypotensive initial bp 70/40.  Attempted vagal maneuvers without improvement. He received 6 mg of adenosine followed by 12 mg of adenosine and converted to normal sinus rhythm.  He denies taking blood thinners, aspirin, or ibuprofen.  He does endorse drinking daily 2 beers and a glass of liquor.  His last alcoholic beverage was 2 weeks ago   ED Course  Upon arrival to the ER pt tachycardic.  EKG revealed sinus tachycardia, heart rate 127, no evidence of ischemia.  Pt incidentally found to be influenza positive.  Pt initially admitted to the progressive care unit per hospitalist team for additional workup and treatment, however he remained in the ER pending bed availability.  See detailed hospital course under significant events.   Initial vital signs: Temp 100.5, bp 122/76, hr 126, O2 sats 93% on RA  Significant labs: glucose 115/BUN 31/calcium 7.4/troponin 52/hgb 8.5 CXR: No active disease   Pertinent  Medical History  HTN HLD PSVT   Significant Hospital Events: Including procedures, antibiotic start and stop dates in addition to other pertinent events   01/9: Pt admitted to the progressive care unit with SVT and influenza A, however remained in the ER pending bed availability.  While in the ER pt had a syncopal episode x2  01/10: GI consulted for worsening anemia, syncope.  Pt had another witnessed syncopal episode with brief episode of unresponsive and became pale/diaphoretic/hypotensive Code Blue initiated.  Noted to have acute GI bleed with maroon colored stools.  GI aware recommended CT Angiogram  Interim  History / Subjective:  Pt pale/hypotensive sbp 60s.  EKG revealed SVT hr 150's.  Pt receiving 1 unit of pRBC's and 1L LR bolus.  Neo-synephrine gtt ordered   Objective   Blood pressure (!) 96/53, pulse (!) 156, temperature 98.1 F (36.7 C), resp. rate (!) 26, height '5\' 9"'$  (1.753 m), weight 79.2 kg, SpO2 93 %.        Intake/Output Summary (Last 24 hours) at 05/18/2022 1831 Last data filed at 05/18/2022 1655 Gross per 24 hour  Intake 2380 ml  Output --  Net 2380 ml   Filed Weights   05/17/22 1836 05/18/22 1700  Weight: 79.4 kg 79.2 kg    Examination: General: Acutely-ill pale appearing elderly male, with active GI bleed  HENT: Supple, no JVD Lungs: Clear throughout, even, non labored  Cardiovascular: SVT, no m/r/g, 2+ radial/2+ distal pulses, no edema  Abdomen: +BS x4, soft, non tender, non distended  Extremities: Normal bulk and tone, moves all extremities  Neuro: Alert and oriented, follows commands, PERRLA GU: Deferred   Resolved Hospital Problem list     Assessment & Plan:  Pre-Syncope secondary to ABLA in the setting of Acute Gastrointestinal Bleed  Tubulovillous Adenomas Iron deficiency anemia Dysphagia PMHx: ETOH use Patient received 2 units of pRBC's & 3 L IVF bolus - f/u CBC, fibrinogen, apTT, INR, vitamin B12, celiac panel- per GI recs - STAT CT angio GIB ordered - LR infusion @ 100 mL/h - phenylephrine drip PRN to maintain MAP > 65 - Octreotide stopped per GI  recommendation as no signs of cirrhosis on previous CT imaging - initiate Protonix bolus followed by infusion - Maintain up to date type & screen, stay 2 units ahead, administer plasma - continuous cardiac monitoring - NPO - Monitor for s/s of bleeding - Daily CBC, PT/ INR monitoring PRN - Transfuse for Hgb <8 - GI consulted, appreciate input  Acute on chronic SVT in the setting of ABLA Elevated Troponin secondary to demand ischemia Patient received adenosine in the ED. Per last cardiology note  patient was not interested in previously offered ablation. Echo 05/18/22: LVEF 65-70%, G1DD, mild dilatation in aorta & aortic root - continue pravastatin - hold outpatient regimen due to hypotension and ABLA, consider restarting as patient stabilizes >> metoprolol - continuous cardiac monitoring - consider cardiology consultation if HR does not improve post potential GIB interventions  PMHx: HLD, HTN Influenza A infection - supplemental O2 PRN to maintain SpO2 > 90% - dyspnea began 5-6 days ago, out of the window for Tamiflu - supportive care, CXR negative for pneumonia  Daily ETOH use Patient stated his last drink was 2 weeks ago - consider CIWA if patient shows signs of DT's, but he should be outside the window at this point - monitor electrolytes closely, daily BMP, Mg, Phos- replace PRN - supportive care  Best Practice (right click and "Reselect all SmartList Selections" daily)  Diet/type: NPO DVT prophylaxis: SCD GI prophylaxis: PPI Lines: N/A Foley:  N/A Code Status:  full code Last date of multidisciplinary goals of care discussion [05/18/22]  Labs   CBC: Recent Labs  Lab 05/17/22 1847 05/18/22 0405 05/18/22 1623  WBC 8.9 5.6 7.9  NEUTROABS 7.3  --   --   HGB 9.8* 8.5* 7.1*  HCT 30.8* 27.0* 22.8*  MCV 96.3 96.8 97.0  PLT 255 222 150    Basic Metabolic Panel: Recent Labs  Lab 05/17/22 1847 05/18/22 0405 05/18/22 1623  NA 132* 135 135  K 4.0 4.1 4.3  CL 101 105 109  CO2 23 24 20*  GLUCOSE 154* 115* 175*  BUN 23 31* 37*  CREATININE 0.87 0.70 0.76  CALCIUM 8.1* 7.4* 7.0*   GFR: Estimated Creatinine Clearance: 81 mL/min (by C-G formula based on SCr of 0.76 mg/dL). Recent Labs  Lab 05/17/22 1847 05/18/22 0405 05/18/22 1623  WBC 8.9 5.6 7.9    Liver Function Tests: Recent Labs  Lab 05/17/22 1847  AST 37  ALT 23  ALKPHOS 57  BILITOT 0.4  PROT 6.3*  ALBUMIN 3.1*   No results for input(s): "LIPASE", "AMYLASE" in the last 168 hours. No results  for input(s): "AMMONIA" in the last 168 hours.  ABG No results found for: "PHART", "PCO2ART", "PO2ART", "HCO3", "TCO2", "ACIDBASEDEF", "O2SAT"   Coagulation Profile: No results for input(s): "INR", "PROTIME" in the last 168 hours.  Cardiac Enzymes: No results for input(s): "CKTOTAL", "CKMB", "CKMBINDEX", "TROPONINI" in the last 168 hours.  HbA1C: No results found for: "HGBA1C"  CBG: Recent Labs  Lab 05/17/22 2002 05/18/22 1316  GLUCAP 134* 153*    Review of Systems: Positives in BOLD  Gen: Denies fever, chills, weight change, fatigue, night sweats HEENT: Denies blurred vision, double vision, hearing loss, tinnitus, sinus congestion, rhinorrhea, sore throat, neck stiffness, dysphagia PULM: Denies shortness of breath, cough, sputum production, hemoptysis, wheezing CV: Denies chest pain, edema, orthopnea, paroxysmal nocturnal dyspnea, palpitations GI: Denies abdominal pain, nausea, vomiting, diarrhea, hematochezia, melena, constipation, change in bowel habits GU: Denies dysuria, hematuria, polyuria, oliguria, urethral discharge Endocrine: Denies hot or  cold intolerance, polyuria, polyphagia or appetite change Derm: Denies rash, dry skin, scaling or peeling skin change Heme: Denies easy bruising, bleeding, bleeding gums Neuro: Denies headache, numbness, weakness, slurred speech, loss of memory or consciousness  Past Medical History:  He,  has a past medical history of HLD (hyperlipidemia), HTN (hypertension), and PSVT (paroxysmal supraventricular tachycardia).   Surgical History:   Past Surgical History:  Procedure Laterality Date   CATARACT EXTRACTION, BILATERAL  2010     Social History:   reports that he has quit smoking. He has never used smokeless tobacco. He reports current alcohol use of about 7.0 standard drinks of alcohol per week. He reports that he does not use drugs.   Family History:  His family history includes Dementia in his mother; Emphysema in his father;  Hypertension in his mother; Prostate cancer in his father.   Allergies No Known Allergies   Home Medications  Prior to Admission medications   Medication Sig Start Date End Date Taking? Authorizing Provider  metoprolol succinate (TOPROL-XL) 50 MG 24 hr tablet Take 50 mg by mouth daily. 04/27/22 04/27/23 Yes [provider]  pravastatin (PRAVACHOL) 40 MG tablet Take 40 mg by mouth at bedtime. 04/27/22  Yes [provider]  nitroGLYCERIN (NITROSTAT) 0.4 MG SL tablet Place 0.4 mg under the tongue every 5 (five) minutes as needed for chest pain (May take up to 3 doses.).    [provider]     Critical care time: 67 minutes      Venetia Night, AGACNP-BC Acute Care Nurse Practitioner [facility]   [phone] / [phone] Please see Amion for pager details.

## 2022-05-18 NOTE — Op Note (Signed)
[facility] Gastroenterology Patient Name: Bobby Moreno Procedure Date: 05/18/2022 10:10 PM MRN: [id number] Account #: [id number] Date of Birth: [birth year] Admit Type: Inpatient Age: 77 Room: [facility] ENDO ROOM 4 Gender: Male Note Status: Finalized Instrument Name: Upper Endoscope [id number] Procedure:             Upper GI endoscopy Indications:           Acute post hemorrhagic anemia, Melena Providers:             Lin Landsman MD, MD Medicines:             General Anesthesia Complications:         No immediate complications. Estimated blood loss: None. Procedure:             Pre-Anesthesia Assessment:                        - Prior to the procedure, a History and Physical was                         performed, and patient medications and allergies were                         reviewed. The patient is unable to give consent                         secondary to the patient's altered mental status. The                         risks and benefits of the procedure and the sedation                         options and risks were discussed with the patient's                         spouse. All questions were answered and informed                         consent was obtained. Patient identification and                         proposed procedure were verified by the physician, the                         nurse and the technician in the pre-procedure area in                         the procedure room. Airway Examination: normal                         oropharyngeal airway and neck mobility. Respiratory                         Examination: clear to auscultation. CV Examination:                         tachycardia noted. Prophylactic Antibiotics: The  patient does not require prophylactic antibiotics.                         Prior Anticoagulants: The patient has taken no                         anticoagulant or antiplatelet agents. ASA Grade                          Assessment: IV - A patient with severe systemic                         disease that is a constant threat to life. After                         reviewing the risks and benefits, the patient was                         deemed in satisfactory condition to undergo the                         procedure. The anesthesia plan was to use general                         anesthesia. Immediately prior to administration of                         medications, the patient was re-assessed for adequacy                         to receive sedatives. The heart rate, respiratory                         rate, oxygen saturations, blood pressure, adequacy of                         pulmonary ventilation, and response to care were                         monitored throughout the procedure. The physical                         status of the patient was re-assessed after the                         procedure.                        After obtaining informed consent, the endoscope was                         passed under direct vision. Throughout the procedure,                         the patient's blood pressure, pulse, and oxygen                         saturations were monitored continuously. The Endoscope  was introduced through the mouth, and advanced to the                         second part of duodenum. The upper GI endoscopy was                         accomplished without difficulty. The patient tolerated                         the procedure well. Findings:      A large, fungating, submucosal and ulcerating mass with bleeding and       stigmata of recent bleeding was found in the distal esophagus and at the       gastroesophageal junction, 40 cm from the incisors. The mass was       partially obstructing and circumferential. To stop active bleeding,       hemostatic spray was deployed. A single spray was applied. There was no       bleeding at the end of the procedure.  Area was successfully injected       with 4 mL of a 0.1 mg/mL solution of epinephrine for hemostasis.       Estimated blood loss: none.      Red blood was found in the gastric fundus.      The gastric body and gastric antrum were normal.      The duodenal bulb and second portion of the duodenum were normal. 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 Hemostatic                         spray applied. Injected.                        - Red blood in the gastric fundus.                        - Normal gastric body and antrum.                        - Normal duodenal bulb and second portion of the                         duodenum.                        - No specimens collected. Recommendation:        - Return patient to ICU for ongoing care.                        - NPO today.                        - Ok to extubate from GI stand point                        - Continue PPI drip                        -  He will need repeat EGD for tissue diagnosis once he                         is medically stable Procedure Code(s):     --- Professional ---                        [phone], Esophagogastroduodenoscopy, flexible,                         transoral; with control of bleeding, any method Diagnosis Code(s):     --- Professional ---                        C16.0, Malignant neoplasm of cardia                        K92.2, Gastrointestinal hemorrhage, unspecified                        D62, Acute posthemorrhagic anemia                        K92.1, Melena (includes Hematochezia) CPT copyright 2022 American Medical Association. All rights reserved. The codes documented in this report are preliminary and upon coder review may  be revised to meet current compliance requirements. Dr. Ulyess Mort Lin Landsman MD, MD 05/18/2022 10:57:21 PM This report has been signed electronically. Number of Addenda:  0 Note Initiated On: 05/18/2022 10:10 PM Estimated Blood Loss:  Estimated blood loss: none.      [facility]

## 2022-05-18 NOTE — Progress Notes (Signed)
ABLA secondary to Acute GIB CT angio GIB 05/18/22:  Active bleeding in the proximal stomach near the gastroesophageal junction. Aortic Atherosclerosis.  1. Moderate distention of the stomach. 2. New indeterminate rim enhancing lesions in the left lobe of the liver. Recommend further evaluation with MRI. 3. Nonobstructing left renal calculi. 4. Prostatomegaly. 5. Right-sided hydrocele F/u Hgb post 2 units 7.4, coags appear stable - relayed results to Dr. Marius Ditch who recommends pre-procedural mechanical intubation and ventilatory support. Discussed with patient and spouse, who both consented to mechanical intubation and CVC placement. - 3rd unit of pRBC's ordered for transfusion - continue phenylephrine drip to maintain MAP > 65 - 500 mL LR bolus - trend lactic post Endo-intervention  Pre-procedural Mechanical Ventilatory support and management - Ventilator settings: PRVC 8 mL/kg, 50% FiO2, 5 PEEP, continue ventilator support & lung protective strategies - Wean PEEP & FiO2 as tolerated, maintain SpO2 > 90% - Head of bed elevated 30 degrees, VAP protocol in place - Plateau pressures less than 30 cm H20  - Intermittent chest x-ray & ABG PRN - Daily WUA with SBT as tolerated  - Ensure adequate pulmonary hygiene  - PAD protocol in place: continue Fentanyl drip & Versed IVP  NAGMA- worsening - sodium bicarbonate supplementation ordered - f/u ABG, daily BMP    Venetia Night, AGACNP-BC Acute Care Nurse Practitioner [facility]   [phone] / [phone] Please see Amion for pager details.

## 2022-05-18 NOTE — Consult Note (Signed)
Bobby Moreno , MD [address] [place], [place], [place], [address] [address], [place], [place], [place], [address] Phone: [phone]  Fax: [phone]  Consultation  Referring Provider:     No ref. provider found Primary Care Physician:  Zeb Comfort, MD Primary Gastroenterologist:  [facility]     Reason for Consultation:     anemia   Date of Admission:  05/17/2022 Date of Consultation:  05/18/2022         HPI:   Bobby Moreno is a 77 y.o. male follows at [place].  Had a index colonoscopy in January 2022 found to have several polyps including 2 tubulovillous adenomas that were biopsied and not removed due to very large size, 1 in the ascending colon and 1 in the transverse colon.  In March 2022 colonoscopy was repeated large polyp 50 mm in size were found in the cecum lateral spreading tumor resected but was incomplete large polyp in the ascending colon.  Polypectomy was not attempted due to large size repeat colonoscopy in 01/26/2022, this was done after the patient had a laparoscopic partial right colectomy in April 2022 with anastomosis.  And on colonoscopy there was evidence of prior side-to-side anastomosis patent 5 sessile polyps were found in the rectum 3 to 4 mm in size that were resected.  Repeat colonoscopy in 3 years was recommended.  Pathology of the specimen showed no instability with microsatellite mutations.  The pathology specimen of the right colectomy showed residual tubulovillous adenoma involving the cecum ileocecal valve and distal terminal ileum 5.7 centimeters in size focal areas of high-grade dysplasia was present negative for residual invasive carcinoma 29 lymph nodes were resected and there were negative the polyp in the ascending colon was also tubulovillous adenoma 5 cm in size.   In January 2024 hemoglobin was 10.5 g on 02/13/2022 04/27/2022 hemoglobin is 12 g MCV normal.  The patient presented to the emergency room yesterday with syncopal tested positive  for influenza A.  Was found to have SVT with a heart rate of 200-205.  Hemoglobin running between 9.8 g for over a year as per our records today is 8.5 g MCV 96.8 BMP shows no gross abnormality troponin mildly elevated at 72 chest x-ray shows no abnormalities echogram completed but not yet reported.  He denies any overt blood loss.  Denies any nasal bleeds blood in the stool hematemesis.  He has lost over 10 pounds of weight since Thanksgiving and the difficulty to swallow solids and liquids are usually okay.  No family history of esophageal cancer he has an ex-smoker stopped 22 years back.  He feels short of breath when laying flat.  This has been going on for the past few days.  Lots of fatigue. Past Medical History:  Diagnosis Date   HLD (hyperlipidemia)    HTN (hypertension)    PSVT (paroxysmal supraventricular tachycardia)     Past Surgical History:  Procedure Laterality Date   CATARACT EXTRACTION, BILATERAL  2010    Prior to Admission medications   Medication Sig Start Date End Date Taking? Authorizing Provider  metoprolol succinate (TOPROL-XL) 50 MG 24 hr tablet Take 50 mg by mouth daily. 04/27/22 04/27/23 Yes [provider]  pravastatin (PRAVACHOL) 40 MG tablet Take 40 mg by mouth at bedtime. 04/27/22  Yes [provider]  nitroGLYCERIN (NITROSTAT) 0.4 MG SL tablet Place 0.4 mg under the tongue every 5 (five) minutes as needed for chest pain (May take up to 3 doses.).  [provider]    Family History  Problem Relation Age of Onset   Hypertension Mother    Dementia Mother    Prostate cancer Father    Emphysema Father      Social History   Tobacco Use   Smoking status: Former   Smokeless tobacco: Never  Substance Use Topics   Alcohol use: Yes    Alcohol/week: 7.0 standard drinks of alcohol    Types: 7 Cans of beer per week   Drug use: Never    Allergies as of 05/17/2022   (No Known Allergies)    Review of Systems:    All systems  reviewed and negative except where noted in HPI.   Physical Exam:  Vital signs in last 24 hours: Temp:  [98.3 F (36.8 C)-100.5 F (38.1 C)] 98.6 F (37 C) (01/10 0715) Pulse Rate:  [86-126] 115 (01/10 1030) Resp:  [18-33] 25 (01/10 1030) BP: (94-125)/(60-77) 112/68 (01/10 1030) SpO2:  [92 %-99 %] 99 % (01/10 1030) Weight:  [79.4 kg] 79.4 kg (01/09 1836)   General:   Pleasant, cooperative in NAD Head:  Normocephalic and atraumatic. Eyes:   No icterus.   Conjunctiva pink. PERRLA. Ears:  Normal auditory acuity. Neck:  Supple; no masses or thyroidomegaly Lungs: Respirations even and unlabored. Lungs clear to auscultation bilaterally.   No wheezes, crackles, or rhonchi.  Heart:  Regular rate and rhythm;  Without murmur, clicks, rubs or gallops Abdomen:  Soft, nondistended, nontender. Normal bowel sounds. No appreciable masses or hepatomegaly.  No rebound or guarding.  Neurologic:  Alert and oriented x3;  grossly normal neurologically. Psych:  Alert and cooperative. Normal affect.  LAB RESULTS: Recent Labs    05/17/22 1847 05/18/22 0405  WBC 8.9 5.6  HGB 9.8* 8.5*  HCT 30.8* 27.0*  PLT 255 222   BMET Recent Labs    05/17/22 1847 05/18/22 0405  NA 132* 135  K 4.0 4.1  CL 101 105  CO2 23 24  GLUCOSE 154* 115*  BUN 23 31*  CREATININE 0.87 0.70  CALCIUM 8.1* 7.4*   LFT Recent Labs    05/17/22 1847  PROT 6.3*  ALBUMIN 3.1*  AST 37  ALT 23  ALKPHOS 57  BILITOT 0.4   PT/INR No results for input(s): "LABPROT", "INR" in the last 72 hours.  STUDIES: DG Chest Port 1 View  Result Date: 05/17/2022 CLINICAL DATA:  Syncope EXAM: PORTABLE CHEST 1 VIEW COMPARISON:  None Available. FINDINGS: The heart size and mediastinal contours are within normal limits. Both lungs are clear. The visualized skeletal structures are unremarkable. IMPRESSION: No active disease. Electronically Signed   By: Ronney Asters M.D.   On: 05/17/2022 22:46      Impression / Plan:   Bobby Moreno is  a 77 y.o. 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  Admitted with 2 syncopal episodes, SVT positive for influenza A I have been asked to see the patient for anemia the anemia appears to be present for over a year iron studies done as an outpatient suggest possible iron deficiency with an iron level of 12.  Recent onset of dysphagia for solids since Thanksgiving with unintentional weight loss.  Differentials for iron deficiency anemia include any form of blood loss, the patient also has a history of dysphagia which needs evaluation   Plan 1.  Celiac serology,  urine analysis, B12 folate, suggest to replace iron with IV iron 2.  Would ideally require an upper endoscopy but I would not rush to do an upper endoscopy right now due to positive for influenza would be high risk especially with recent SVT, at this point of time and I went to see him in the room he was saturating 93% while lying flat.  To get some answers with regards to dysphagia we can get a barium swallow in a day or 2 during this admission once he is safe to mobilize and if it shows any gross abnormality act on it right away if no gross abnormality seen he can follow-up with his gastroenterologist as an outpatient, and get an elective upper endoscopy once he has recovered from the flu.    Thank you for involving me in the care of this patient.      LOS: 1 day   Bobby Bellows, MD  05/18/2022, 10:42 AM

## 2022-05-18 NOTE — Procedures (Signed)
Intubation Procedure Note  Bobby Moreno  [id number]  May 31, 1945  Date:05/18/22  Time:9:29 PM   Provider Performing:Damiyah Ditmars L [place]    Procedure: Intubation (31500)  Indication(s) Pre-procedural per Gastroenterology request  Consent Risks of the procedure as well as the alternatives and risks of each were explained to the patient and/or caregiver.  Consent for the procedure was obtained and is signed in the bedside chart   Anesthesia Etomidate, Versed, and Fentanyl   Time Out Verified patient identification, verified procedure, site/side was marked, verified correct patient position, special equipment/implants available, medications/allergies/relevant history reviewed, required imaging and test results available.   Sterile Technique Usual hand hygeine, masks, and gloves were used   Procedure Description Patient positioned in bed supine.  Sedation given as noted above.  Patient was intubated with endotracheal tube using Glidescope.  View was Grade 1 full glottis .  Number of attempts was 1.  Colorimetric CO2 detector was consistent with tracheal placement.   Complications/Tolerance None; patient tolerated the procedure well. Chest X-ray is ordered to verify placement.   EBL Minimal   Specimen(s) None  Venetia Night, AGACNP-BC Acute Care Nurse Practitioner [facility]   [phone] / [phone] Please see Amion for pager details.

## 2022-05-18 NOTE — Progress Notes (Signed)
An USGPIV (ultrasound guided PIV) has been placed for short-term vasopressor infusion. A correctly placed ivWatch must be used when administering Vasopressors. Should this treatment be needed beyond 72 hours, central line access should be obtained.  It will be the responsibility of the bedside nurse to follow best practice to prevent extravasations.  HS Lavontay Kirk RN 

## 2022-05-18 NOTE — ED Provider Notes (Signed)
[facility] Department of Emergency Medicine   Code Blue CONSULT NOTE  Chief Complaint: Cardiac arrest/unresponsive     History of present illness: I was contacted by the hospital for a CODE BLUE cardiac arrest and presented to the patient's bedside.  She had witnessed syncopal event pale diaphoretic ill-appearing tachycardic hypotensive.  On my arrival patient able to follow commands does appear ill.  Diaphoretic.  Found to be tachycardic with regular rhythm on the monitor.  Soft blood pressure but on repeat improved.   ROS: Unable to obtain, Level V caveat  Scheduled Meds:  metoprolol tartrate  5 mg Intravenous Once   pravastatin  40 mg Oral QHS   Continuous Infusions: PRN Meds:.acetaminophen **OR** acetaminophen, labetalol, ondansetron **OR** ondansetron (ZOFRAN) IV, senna-docusate Past Medical History:  Diagnosis Date   HLD (hyperlipidemia)    HTN (hypertension)    PSVT (paroxysmal supraventricular tachycardia)    Past Surgical History:  Procedure Laterality Date   CATARACT EXTRACTION, BILATERAL  2010   Social History   Socioeconomic History   Marital status: Married    Spouse name: Not on file   Number of children: Not on file   Years of education: Not on file   Highest education level: Not on file  Occupational History   Not on file  Tobacco Use   Smoking status: Former   Smokeless tobacco: Never  Substance and Sexual Activity   Alcohol use: Yes    Alcohol/week: 7.0 standard drinks of alcohol    Types: 7 Cans of beer per week   Drug use: Never   Sexual activity: Not Currently    Partners: Female  Other Topics Concern   Not on file  Social History Narrative   Not on file   Social Determinants of Health   Financial Resource Strain: Not on file  Food Insecurity: No Food Insecurity (05/18/2022)   Hunger Vital Sign    Worried About Running Out of Food in the Last Year: Never true    Ran Out of Food in the Last Year: Never true   Transportation Needs: No Transportation Needs (05/18/2022)   PRAPARE - Hydrologist (Medical): No    Lack of Transportation (Non-Medical): No  Physical Activity: Not on file  Stress: Not on file  Social Connections: Not on file  Intimate Partner Violence: Not At Risk (05/18/2022)   Humiliation, Afraid, Rape, and Kick questionnaire    Fear of Current or Ex-Partner: No    Emotionally Abused: No    Physically Abused: No    Sexually Abused: No   No Known Allergies  Last set of Vital Signs (not current) Vitals:   05/18/22 1200 05/18/22 1219  BP: 110/63   Pulse:  (!) 116  Resp: (!) 21 (!) 22  Temp:    SpO2:  94%      Physical Exam  Gen: ill appearing Cardiovascular: irregularly irregular tachycardic Resp: apneic. Spontaneous,  equal bilaterally Abd: nondistended  Musculoskeletal: No deformity  Skin: warm  Procedures (when applicable, including Critical Care time): Procedures   MDM / Assessment and Plan Patient with witnessed syncopal episode brief episode of unresponsiveness now responding tachycardic.  Twelve-lead ordered.  Given IV fluid bolus.  Blood sugar 150.  Stayed at bedside until hospitalist arrived at bedside to take over care.    Merlyn Lot, MD 05/18/22 1326

## 2022-05-18 NOTE — Progress Notes (Signed)
Code Blue page over phone, not called audibly because it was ED. Patient is an admitted patient waiting a bed placement. Upon arrival patient being cared for by staff. Daughter in the hallway but on the phone.

## 2022-05-18 NOTE — Progress Notes (Signed)
OT Cancellation Note  Patient Details Name: Bobby Moreno MRN: [id number] DOB: Feb 18, 1946   Cancelled Treatment:    Reason Eval/Treat Not Completed: Patient not medically ready. OT orders received, chart reviewed. Pt not medically appropriate for therapy at this time. Will re-attempt as able.   Doneta Public 05/18/2022, 1:24 PM

## 2022-05-18 NOTE — Progress Notes (Signed)
PT Cancellation Note  Patient Details Name: Bobby Moreno MRN: [id number] DOB: Aug 22, 1945   Cancelled Treatment:    Reason Eval/Treat Not Completed: Patient not medically ready PT orders received, chart reviewed. Pt not medically appropriate for therapy at this time. Will f/u as able.  Lavone Nian, PT, DPT 05/18/22, 1:16 PM   [name] 05/18/2022, 1:16 PM

## 2022-05-18 NOTE — Progress Notes (Addendum)
PROGRESS NOTE    Keylor Rands   BMW:[id number] DOB: 09-18-1945  DOA: 05/17/2022 Date of Service: 05/18/22 PCP: Zeb Comfort, MD     Brief Narrative / Hospital Course:  Mr. Dagen Beevers is a 77 year old male with history of hypertension, hyperlipidemia, paroxysmal supraventricular tachycardia, who presents emergency department 05/17/22 for chief concerns of near syncope - reports he "fell out" but no LOC, has been feeling generally weak for several weeks but worse past 3-4 days.  01/09: In ED,  temperature of 100.5, respiration rate of 22, heart rate 123, blood pressure 101/68, SpO2 >90% on 2 L nasal cannula. Serum sodium was 132, hemoglobin 9.8, platelets of 255. High sensitive troponin was 33. Patient tested positive for influenza A PCR. Per ED report: EMS found patient in SVT with heart rate around 200-205.  Patient was given adenosine 6 and adenosine 12. ED treatment: Acetaminophen 650 mg p.o. one-time dose, sodium chloride 500 mL bolus. 01/10:  Hgb a bit lower to 8.5. Consulted GI (pt's PCP had started workup for anemia and dysphagia) and will consider barium swallow in a few days but hold off on procedures for now given high risk. RAPID RESPONSE called today approx 13:30 when pt had another near-syncopal episode witnessed by RN see below - tachycardia appears sinus but EKG changes concerning for ischemia - suspect demand effect from tachycardia and possible hypovolemia - IV fluid bolus. CBC and BMP ordered. Had another similar episode again at approx 14:30. Curbside cardiology - Dr Garen Lah reviewed echo and per him EF normal, likely symptoms d/t flu/sepsis and anemia, not concerned with ACS or PSVT. Pt received total 2 units PRBC. Per RN (+)black tarry stool x2, GI to scope tomorrow. Given hypotension and tachycardia which cardiology agrees is d/t hypotension rather than arrhythmia, have asked Dr Burnett Corrente to evaluate in case needing pressors / deteriorating overnight.    Consultants:   GI  Procedures: none      ASSESSMENT & PLAN:   Principal Problem:   Acute blood loss anemia (ABLA) Active Problems:   HTN (hypertension)   HLD (hyperlipidemia)   Near syncope   Iron deficiency anemia   Influenza A   Elevated troponin   Other dysphagia   Symptomatic anemia   GI bleed   Hypotension   Sinus tachycardia by electrocardiogram  Near Syncope d/t ABLA d/t GI bleed, complicated by (+)Influenza PSVT as well as sinus tachycardia Sinus tach likely in response to hypovolemic status +/- influenza/sepsis response  Concern for SVT +/- influenza as triggering factor Pt had RAPID RESPONSE called 05/18/22 approx 13:30 when pt had another near-syncopal episode witnessed by RN - tachycardia appears sinus but EKG changes concerning for ischemia - suspect demand effect from tachycardia and possible hypovolemia. Curbside cardiology - Dr Garen Lah reviewed echo and per him EF normal, likely all flu/sepsis and anemia, not concerned with ACS or PSVT  IV fluid bolus.  CBC and BMP ordered --> Hgb 7.1, pt had already been getting 1 unit PRBC which started after this blood draw Curbside cardiology - Dr Garen Lah reviewed echo and per him EF normal, likely symptoms d/t flu/sepsis and anemia, not concerned with ACS or PSVT.  Monitoring sinus tachy and low BP for now will not treat HR at this time - if not resolving with fluids and/or if persistent iscehmic changes on EKG will ask cardiology team to reevaluate. Given hypotension and tachycardia which cardiology agrees is d/t hypotension rather than arrhythmia, have asked Dr Burnett Corrente to evaluate in case needing  pressors / deteriorating overnight.  Pt received total 2 units PRBC.  Per RN (+)black tarry stool x2, GI to scope tomorrow.   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 Treated in ED 05/17/22 w/ adenosine  Per last cardiology note 03/09/2022 "option of EP study with ablation for his SVT, he is not interested in a procedure at this time." Trial  PRN metoprolol   Holding beta blocker at this time d/t low BP  Influenza A Likely complicating factor to underlying anemia and generalized weakness, reason for near-syncope and trigger for SVT  No concerns on CXR, will not start abx Symptomatic support including acetaminophen as needed He is outside window for Tamiflu   Elevated troponin with subsequent downtrend Suspect secondary to demand ischemia in setting of SVT 08/2021 cardiac catheterization which showed no significant CAD  Complete echo done - normal EF and no RWMA, mild LVH, G1DD  HTN (hypertension) Hold home meds   Iron deficiency anemia Patient had anemia study done outpatient 01/08: Serum iron level was low at 12 and transferrin saturation was 3% currently being worked up for his new onset anemia outpatient Will order IV Iron while here GI consulted - hold on any endoscopy unless urgent / concern for bleeding  Dysphagia  Contributing to poor po intake and therefore lowering functional reserves vis possible malnourishment GI consulted - consider swallow study  HLD (hyperlipidemia) Pravastatin 40 mg qhs resumed    DVT prophylaxis: stopped heparin for now, SCD Pertinent IV fluids/nutrition: received IV fluids, now on po  Central lines / invasive devices: none   Code Status: FULL CODE   Current Admission Status: inpatient  TOC needs / Dispo plan: may need HH/SNF, will ask PT/OT to assess  Barriers to discharge / significant pending items: Echo, GI consult.              Subjective / Brief ROS:  Patient reports feeling almost back to normal by the time I got there after the rapid. Earlier in the day he appears fine and no complaints other than tired Denies CP/SOB.  Denies new weakness.  Tolerating diet.  Reports no concerns w/ urination/defecation.   Family Communication: wife is at bedside this morning on rounds and at rapid response     Objective Findings:  Vitals:   05/18/22 1640 05/18/22  1645 05/18/22 1700 05/18/22 1726  BP: (!) 95/57 104/86 (!) 74/53 (!) 74/53  Pulse:   (!) 155 (!) 156  Resp: (!) 24 (!) 27 20 (!) 30  Temp:   98.1 F (36.7 C) 98.1 F (36.7 C)  TempSrc:   Oral Oral  SpO2:   93%   Weight:   79.2 kg   Height:   '5\' 9"'$  (1.753 m)     Intake/Output Summary (Last 24 hours) at 05/18/2022 1729 Last data filed at 05/18/2022 1655 Gross per 24 hour  Intake 2380 ml  Output --  Net 2380 ml   Filed Weights   05/17/22 1836 05/18/22 1700  Weight: 79.4 kg 79.2 kg    Examination:  Physical Exam Constitutional:      Appearance: He is ill-appearing and diaphoretic.  Cardiovascular:     Rate and Rhythm: Regular rhythm. Tachycardia present.  Pulmonary:     Effort: Pulmonary effort is normal. No respiratory distress.     Breath sounds: Normal breath sounds. No rales.  Abdominal:     General: Abdomen is flat.  Skin:    Coloration: Skin is pale.  Neurological:     General: No focal  deficit present.     Mental Status: He is alert and oriented to person, place, and time.  Psychiatric:        Behavior: Behavior normal.        Thought Content: Thought content normal.          Scheduled Medications:   sodium chloride   Intravenous Once   Chlorhexidine Gluconate Cloth  6 each Topical Daily   pravastatin  40 mg Oral QHS    Continuous Infusions:   PRN Medications:  acetaminophen **OR** acetaminophen, ondansetron **OR** ondansetron (ZOFRAN) IV, senna-docusate  Antimicrobials from admission:  Anti-infectives (From admission, onward)    None           Data Reviewed:  I have personally reviewed the following...  CBC: Recent Labs  Lab 05/17/22 1847 05/18/22 0405 05/18/22 1623  WBC 8.9 5.6 7.9  NEUTROABS 7.3  --   --   HGB 9.8* 8.5* 7.1*  HCT 30.8* 27.0* 22.8*  MCV 96.3 96.8 97.0  PLT 255 222 174   Basic Metabolic Panel: Recent Labs  Lab 05/17/22 1847 05/18/22 0405 05/18/22 1623  NA 132* 135 135  K 4.0 4.1 4.3  CL 101 105 109   CO2 23 24 20*  GLUCOSE 154* 115* 175*  BUN 23 31* 37*  CREATININE 0.87 0.70 0.76  CALCIUM 8.1* 7.4* 7.0*   GFR: Estimated Creatinine Clearance: 81 mL/min (by C-G formula based on SCr of 0.76 mg/dL). Liver Function Tests: Recent Labs  Lab 05/17/22 1847  AST 37  ALT 23  ALKPHOS 57  BILITOT 0.4  PROT 6.3*  ALBUMIN 3.1*   No results for input(s): "LIPASE", "AMYLASE" in the last 168 hours. No results for input(s): "AMMONIA" in the last 168 hours. Coagulation Profile: No results for input(s): "INR", "PROTIME" in the last 168 hours. Cardiac Enzymes: No results for input(s): "CKTOTAL", "CKMB", "CKMBINDEX", "TROPONINI" in the last 168 hours. BNP (last 3 results) No results for input(s): "PROBNP" in the last 8760 hours. HbA1C: No results for input(s): "HGBA1C" in the last 72 hours. CBG: Recent Labs  Lab 05/17/22 2002 05/18/22 1316  GLUCAP 134* 153*   Lipid Profile: No results for input(s): "CHOL", "HDL", "LDLCALC", "TRIG", "CHOLHDL", "LDLDIRECT" in the last 72 hours. Thyroid Function Tests: No results for input(s): "TSH", "T4TOTAL", "FREET4", "T3FREE", "THYROIDAB" in the last 72 hours. Anemia Panel: Recent Labs    05/18/22 0405  FOLATE 14.5   Most Recent Urinalysis On File:     Component Value Date/Time   COLORURINE YELLOW (A) 07/11/2020 0046   APPEARANCEUR HAZY (A) 07/11/2020 0046   LABSPEC >1.046 (H) 07/11/2020 0046   PHURINE 5.0 07/11/2020 0046   GLUCOSEU NEGATIVE 07/11/2020 0046   HGBUR NEGATIVE 07/11/2020 0046   BILIRUBINUR NEGATIVE 07/11/2020 0046   KETONESUR 5 (A) 07/11/2020 0046   PROTEINUR NEGATIVE 07/11/2020 0046   NITRITE NEGATIVE 07/11/2020 0046   LEUKOCYTESUR NEGATIVE 07/11/2020 0046   Sepsis Labs: '@LABRCNTIP'$ (procalcitonin:4,lacticidven:4) Microbiology: Recent Results (from the past 240 hour(s))  Resp panel by RT-PCR (RSV, Flu A&B, Covid) Anterior Nasal Swab     Status: Abnormal   Collection Time: 05/17/22  7:53 PM   Specimen: Anterior Nasal  Swab  Result Value Ref Range Status   SARS Coronavirus 2 by RT PCR NEGATIVE NEGATIVE Final    Comment: (NOTE) SARS-CoV-2 target nucleic acids are NOT DETECTED.  The SARS-CoV-2 RNA is generally detectable in upper respiratory specimens during the acute phase of infection. The lowest concentration of SARS-CoV-2 viral copies this assay can detect  is 138 copies/mL. A negative result does not preclude SARS-Cov-2 infection and should not be used as the sole basis for treatment or other patient management decisions. A negative result may occur with  improper specimen collection/handling, submission of specimen other than nasopharyngeal swab, presence of viral mutation(s) within the areas targeted by this assay, and inadequate number of viral copies(<138 copies/mL). A negative result must be combined with clinical observations, patient history, and epidemiological information. The expected result is Negative.  Fact Sheet for Patients:  EntrepreneurPulse.com.au  Fact Sheet for Healthcare Providers:  IncredibleEmployment.be  This test is no t yet approved or cleared by the [place] FDA and  has been authorized for detection and/or diagnosis of SARS-CoV-2 by FDA under an Emergency Use Authorization (EUA). This EUA will remain  in effect (meaning this test can be used) for the duration of the COVID-19 declaration under Section 564(b)(1) of the Act, 21 U.S.C.section 360bbb-3(b)(1), unless the authorization is terminated  or revoked sooner.       Influenza A by PCR POSITIVE (A) NEGATIVE Final   Influenza B by PCR NEGATIVE NEGATIVE Final    Comment: (NOTE) The Xpert Xpress SARS-CoV-2/FLU/RSV plus assay is intended as an aid in the diagnosis of influenza from Nasopharyngeal swab specimens and should not be used as a sole basis for treatment. Nasal washings and aspirates are unacceptable for Xpert Xpress SARS-CoV-2/FLU/RSV testing.  Fact Sheet for  Patients: EntrepreneurPulse.com.au  Fact Sheet for Healthcare Providers: IncredibleEmployment.be  This test is not yet approved or cleared by the [place] FDA and has been authorized for detection and/or diagnosis of SARS-CoV-2 by FDA under an Emergency Use Authorization (EUA). This EUA will remain in effect (meaning this test can be used) for the duration of the COVID-19 declaration under Section 564(b)(1) of the Act, 21 U.S.C. section 360bbb-3(b)(1), unless the authorization is terminated or revoked.     Resp Syncytial Virus by PCR NEGATIVE NEGATIVE Final    Comment: (NOTE) Fact Sheet for Patients: EntrepreneurPulse.com.au  Fact Sheet for Healthcare Providers: IncredibleEmployment.be  This test is not yet approved or cleared by the [place] FDA and has been authorized for detection and/or diagnosis of SARS-CoV-2 by FDA under an Emergency Use Authorization (EUA). This EUA will remain in effect (meaning this test can be used) for the duration of the COVID-19 declaration under Section 564(b)(1) of the Act, 21 U.S.C. section 360bbb-3(b)(1), unless the authorization is terminated or revoked.  Performed at [facility], [address], [place], [place] [address]       Radiology Studies last 3 days: ECHOCARDIOGRAM COMPLETE  Result Date: 05/18/2022    ECHOCARDIOGRAM REPORT   Patient Name:   ISMAEEL ARVELO Date of Exam: 05/18/2022 Medical Rec #:  [id number]   Height:       69.0 in Accession #:    [id number]  Weight:       175.0 lb Date of Birth:  [birth year]   BSA:          1.952 m Patient Age:    74 years    BP:           121/76 mmHg Patient Gender: M           HR:           101 bpm. Exam Location:  [facility] Procedure: 2D Echo, Cardiac Doppler and Color Doppler Indications:     Syncope R55  Elevated troponin  History:         Patient has no prior history of Echocardiogram examinations.                   Risk Factors:Hypertension and Dyslipidemia. PSVT.  Sonographer:     Sherrie Sport Referring Phys:  [id number] AMY N COX Diagnosing Phys: Kate Sable MD  Sonographer Comments: Suboptimal apical window and no subcostal window. IMPRESSIONS  1. Left ventricular ejection fraction, by estimation, is 65 to 70%. The left ventricle has normal function. The left ventricle has no regional wall motion abnormalities. There is mild left ventricular hypertrophy. Left ventricular diastolic parameters are consistent with Grade I diastolic dysfunction (impaired relaxation).  2. Right ventricular systolic function is normal. The right ventricular size is normal.  3. The mitral valve is normal in structure. No evidence of mitral valve regurgitation.  4. The aortic valve is tricuspid. Aortic valve regurgitation is mild. Aortic valve sclerosis/calcification is present, without any evidence of aortic stenosis.  5. Aortic dilatation noted. There is mild dilatation of the ascending aorta, measuring 42 mm. There is mild dilatation of the aortic root, measuring 39 mm. FINDINGS  Left Ventricle: Left ventricular ejection fraction, by estimation, is 65 to 70%. The left ventricle has normal function. The left ventricle has no regional wall motion abnormalities. The left ventricular internal cavity size was normal in size. There is  mild left ventricular hypertrophy. Left ventricular diastolic parameters are consistent with Grade I diastolic dysfunction (impaired relaxation). Right Ventricle: The right ventricular size is normal. No increase in right ventricular wall thickness. Right ventricular systolic function is normal. Left Atrium: Left atrial size was normal in size. Right Atrium: Right atrial size was normal in size. Pericardium: There is no evidence of pericardial effusion. Mitral Valve: The mitral valve is normal in structure. No evidence of mitral valve regurgitation. Tricuspid Valve: The tricuspid valve is normal in  structure. Tricuspid valve regurgitation is trivial. Aortic Valve: The aortic valve is tricuspid. Aortic valve regurgitation is mild. Aortic valve sclerosis/calcification is present, without any evidence of aortic stenosis. Aortic valve mean gradient measures 3.0 mmHg. Aortic valve peak gradient measures 4.9 mmHg. Aortic valve area, by VTI measures 4.43 cm. Pulmonic Valve: The pulmonic valve was not well visualized. Pulmonic valve regurgitation is not visualized. Aorta: Aortic dilatation noted. There is mild dilatation of the ascending aorta, measuring 42 mm. There is mild dilatation of the aortic root, measuring 39 mm. Venous: The inferior vena cava was not well visualized. IAS/Shunts: No atrial level shunt detected by color flow Doppler.  LEFT VENTRICLE PLAX 2D LVIDd:         3.80 cm   Diastology LVIDs:         2.60 cm   LV e' medial:    9.90 cm/s LV PW:         1.20 cm   LV E/e' medial:  7.0 LV IVS:        1.20 cm   LV e' lateral:   10.60 cm/s LVOT diam:     2.20 cm   LV E/e' lateral: 6.6 LV SV:         67 LV SV Index:   34 LVOT Area:     3.80 cm  RIGHT VENTRICLE RV Basal diam:  2.70 cm RV Mid diam:    2.40 cm RV S prime:     18.00 cm/s TAPSE (M-mode): 2.0 cm LEFT ATRIUM  Index        RIGHT ATRIUM           Index LA diam:        3.40 cm 1.74 cm/m   RA Area:     16.20 cm LA Vol (A2C):   51.2 ml 26.23 ml/m  RA Volume:   41.20 ml  21.11 ml/m LA Vol (A4C):   36.9 ml 18.90 ml/m LA Biplane Vol: 46.6 ml 23.87 ml/m  AORTIC VALVE AV Area (Vmax):    4.08 cm AV Area (Vmean):   4.04 cm AV Area (VTI):     4.43 cm AV Vmax:           111.00 cm/s AV Vmean:          77.000 cm/s AV VTI:            0.152 m AV Peak Grad:      4.9 mmHg AV Mean Grad:      3.0 mmHg LVOT Vmax:         119.00 cm/s LVOT Vmean:        81.800 cm/s LVOT VTI:          0.177 m LVOT/AV VTI ratio: 1.16  AORTA Ao Root diam: 3.94 cm MITRAL VALVE                TRICUSPID VALVE MV Area (PHT): 4.71 cm     TR Peak grad:   12.4 mmHg MV Decel  Time: 161 msec     TR Vmax:        176.00 cm/s MV E velocity: 69.70 cm/s MV A velocity: 110.00 cm/s  SHUNTS MV E/A ratio:  0.63         Systemic VTI:  0.18 m                             Systemic Diam: 2.20 cm Kate Sable MD Electronically signed by Kate Sable MD Signature Date/Time: 05/18/2022/2:48:12 PM    Final    DG Chest Port 1 View  Result Date: 05/17/2022 CLINICAL DATA:  Syncope EXAM: PORTABLE CHEST 1 VIEW COMPARISON:  None Available. FINDINGS: The heart size and mediastinal contours are within normal limits. Both lungs are clear. The visualized skeletal structures are unremarkable. IMPRESSION: No active disease. Electronically Signed   By: Ronney Asters M.D.   On: 05/17/2022 22:46             LOS: 1 day       Emeterio Reeve, DO [facility] 05/18/2022, 5:29 PM    Dictation software may have been used to generate the above note. Typos may occur and escape review in typed/dictated notes. Please contact Dr Sheppard Coil directly for clarity if needed.  Staff may message me via secure chat in [place]  but this may not receive an immediate response,  please page me for urgent matters!  If 7PM-7AM, please contact night coverage [email or web address]

## 2022-05-19 ENCOUNTER — Encounter: Payer: Self-pay | Admitting: Gastroenterology

## 2022-05-19 DIAGNOSIS — J101 Influenza due to other identified influenza virus with other respiratory manifestations: Secondary | ICD-10-CM | POA: Diagnosis not present

## 2022-05-19 DIAGNOSIS — K922 Gastrointestinal hemorrhage, unspecified: Secondary | ICD-10-CM

## 2022-05-19 DIAGNOSIS — D649 Anemia, unspecified: Secondary | ICD-10-CM | POA: Diagnosis not present

## 2022-05-19 DIAGNOSIS — D62 Acute posthemorrhagic anemia: Secondary | ICD-10-CM | POA: Diagnosis not present

## 2022-05-19 DIAGNOSIS — R578 Other shock: Secondary | ICD-10-CM | POA: Diagnosis not present

## 2022-05-19 DIAGNOSIS — R7989 Other specified abnormal findings of blood chemistry: Secondary | ICD-10-CM | POA: Diagnosis not present

## 2022-05-19 LAB — COMPREHENSIVE METABOLIC PANEL WITH GFR
ALT: 19 U/L (ref 0–44)
AST: 34 U/L (ref 15–41)
Albumin: 2.1 g/dL — ABNORMAL LOW (ref 3.5–5.0)
Alkaline Phosphatase: 34 U/L — ABNORMAL LOW (ref 38–126)
Anion gap: 7 (ref 5–15)
BUN: 45 mg/dL — ABNORMAL HIGH (ref 8–23)
CO2: 22 mmol/L (ref 22–32)
Calcium: 7.5 mg/dL — ABNORMAL LOW (ref 8.9–10.3)
Chloride: 109 mmol/L (ref 98–111)
Creatinine, Ser: 0.97 mg/dL (ref 0.61–1.24)
GFR, Estimated: >60 mL/min
Glucose, Bld: 185 mg/dL — ABNORMAL HIGH (ref 70–99)
Potassium: 4.7 mmol/L (ref 3.5–5.1)
Sodium: 138 mmol/L (ref 135–145)
Total Bilirubin: 0.3 mg/dL (ref 0.3–1.2)
Total Protein: 4.2 g/dL — ABNORMAL LOW (ref 6.5–8.1)

## 2022-05-19 LAB — GLUCOSE, CAPILLARY
Glucose-Capillary: 165 mg/dL — ABNORMAL HIGH (ref 70–99)
Glucose-Capillary: 167 mg/dL — ABNORMAL HIGH (ref 70–99)
Glucose-Capillary: 114 mg/dL — ABNORMAL HIGH (ref 70–99)
Glucose-Capillary: 148 mg/dL — ABNORMAL HIGH (ref 70–99)
Glucose-Capillary: 150 mg/dL — ABNORMAL HIGH (ref 70–99)
Glucose-Capillary: 138 mg/dL — ABNORMAL HIGH (ref 70–99)
Glucose-Capillary: 124 mg/dL — ABNORMAL HIGH (ref 70–99)

## 2022-05-19 LAB — BLOOD GAS, VENOUS
Acid-Base Excess: 0.1 mmol/L (ref 0.0–2.0)
Bicarbonate: 23.6 mmol/L (ref 20.0–28.0)
O2 Saturation: 86.7 %
Patient temperature: 37
pCO2, Ven: 34 mmHg — ABNORMAL LOW (ref 44–60)
pH, Ven: 7.45 — ABNORMAL HIGH (ref 7.25–7.43)
pO2, Ven: 53 mmHg — ABNORMAL HIGH (ref 32–45)

## 2022-05-19 LAB — PREPARE RBC (CROSSMATCH)

## 2022-05-19 LAB — HEMOGLOBIN AND HEMATOCRIT, BLOOD
HCT: 25 % — ABNORMAL LOW (ref 39.0–52.0)
HCT: 23.7 % — ABNORMAL LOW (ref 39.0–52.0)
HCT: 21.3 % — ABNORMAL LOW (ref 39.0–52.0)
Hemoglobin: 8.5 g/dL — ABNORMAL LOW (ref 13.0–17.0)
Hemoglobin: 7.8 g/dL — ABNORMAL LOW (ref 13.0–17.0)
Hemoglobin: 7.1 g/dL — ABNORMAL LOW (ref 13.0–17.0)

## 2022-05-19 LAB — LACTIC ACID, PLASMA
Lactic Acid, Venous: 2.8 mmol/L (ref 0.5–1.9)
Lactic Acid, Venous: 2.8 mmol/L (ref 0.5–1.9)
Lactic Acid, Venous: 5.5 mmol/L (ref 0.5–1.9)
Lactic Acid, Venous: 5.6 mmol/L (ref 0.5–1.9)
Lactic Acid, Venous: 4.4 mmol/L (ref 0.5–1.9)

## 2022-05-19 LAB — CBC
HCT: 26.1 % — ABNORMAL LOW (ref 39.0–52.0)
Hemoglobin: 8.6 g/dL — ABNORMAL LOW (ref 13.0–17.0)
MCH: 29.1 pg (ref 26.0–34.0)
MCHC: 33 g/dL (ref 30.0–36.0)
MCV: 88.2 fL (ref 80.0–100.0)
Platelets: 215 K/uL (ref 150–400)
RBC: 2.96 MIL/uL — ABNORMAL LOW (ref 4.22–5.81)
RDW: 16.4 % — ABNORMAL HIGH (ref 11.5–15.5)
WBC: 11.5 K/uL — ABNORMAL HIGH (ref 4.0–10.5)
nRBC: 0 % (ref 0.0–0.2)

## 2022-05-19 LAB — PHOSPHORUS: Phosphorus: 4.6 mg/dL (ref 2.5–4.6)

## 2022-05-19 LAB — VITAMIN B12: Vitamin B-12: 1156 pg/mL — ABNORMAL HIGH (ref 180–914)

## 2022-05-19 LAB — TRIGLYCERIDES: Triglycerides: 138 mg/dL (ref <150)

## 2022-05-19 LAB — MAGNESIUM: Magnesium: 2.4 mg/dL (ref 1.7–2.4)

## 2022-05-19 MED ORDER — SODIUM CHLORIDE 0.9% IV SOLUTION: Freq: Once | INTRAVENOUS | Status: AC

## 2022-05-19 MED ORDER — SODIUM CHLORIDE 0.9 % IV SOLN: 1.0000 g | Freq: Two times a day (BID) | INTRAVENOUS | Status: DC

## 2022-05-19 MED ORDER — FOLIC ACID 5 MG/ML IJ SOLN
1.0000 mg | Freq: Every day | INTRAMUSCULAR | Status: DC
  Administered 2022-05-19 – 2022-05-23 (×5): 1 mg via INTRAVENOUS
  Filled 2022-05-19 (×5): qty 0.2

## 2022-05-19 MED ORDER — ALBUMIN HUMAN 25 % IV SOLN
25.0000 g | Freq: Four times a day (QID) | INTRAVENOUS | Status: AC
  Administered 2022-05-19 (×2): 25 g via INTRAVENOUS
  Filled 2022-05-19 (×2): qty 100

## 2022-05-19 MED ORDER — THIAMINE HCL 100 MG/ML IJ SOLN
100.0000 mg | Freq: Every day | INTRAMUSCULAR | Status: DC
  Administered 2022-05-19 – 2022-05-24 (×6): 100 mg via INTRAVENOUS
  Filled 2022-05-19 (×6): qty 2

## 2022-05-19 MED ORDER — SODIUM CHLORIDE 0.9 % IV SOLN
2.0000 g | INTRAVENOUS | Status: DC
  Administered 2022-05-19: 2 g via INTRAVENOUS
  Filled 2022-05-19: qty 20

## 2022-05-19 MED ORDER — SODIUM CHLORIDE 0.9 % IV SOLN: 1.0000 mg | Freq: Once | INTRAVENOUS | Status: DC

## 2022-05-19 NOTE — Evaluation (Signed)
Physical Therapy Evaluation Patient Details Name: Bobby Moreno MRN: [id number] DOB: [birth year] Today's Date: 05/19/2022  History of Present Illness  Pt is a 77 y/o M admitted on 05/17/22 after presenting to the ED with chief concerns of syncope. Pt tested + for Flu A & was found to have sinus tachycardia. While in ED waiting for a bed pt had 3 syncopal episodes, becoming unresponsive & Code Blue initiated on the 3rd. Pt noted to have acute GI bleed with maroon colored stools. 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  PMH: HTN, HLD, paroxysmal supraventricular tachycardia   Clinical Impression  Pt seen for PT evaluation with co-tx with OT.  Pt reports prior to admission pt was independent without AD, living with his wife. On this date, pt is able to complete bed mobility with supervision & HOB elevated, STS & step pivot with +2 HHA min assist. Pt demonstrates posterior lean during standing & decreased stepping/shuffled steps during transfer. Pt does initially c/o lightheadedness upon sitting EOB but states he feels good sitting up in recliner. Anticipate pt will progress well while in acute setting.    Recommendations for follow up therapy are one component of a multi-disciplinary discharge planning process, led by the attending physician.  Recommendations may be updated based on patient status, additional functional criteria and insurance authorization.  Follow Up Recommendations Home health PT      Assistance Recommended at Discharge Frequent or constant Supervision/Assistance  Patient can return home with the following  A little help with walking and/or transfers;A little help with bathing/dressing/bathroom;Assistance with cooking/housework;Assist for transportation;Help with stairs or ramp for entrance    Equipment Recommendations Rolling walker (2 wheels)  Recommendations for Other Services        Functional Status Assessment Patient has had a recent decline in their functional status and demonstrates the ability to make significant improvements in function in a reasonable and predictable amount of time.     Precautions / Restrictions Precautions Precautions: Fall Restrictions Weight Bearing Restrictions: No      Mobility  Bed Mobility Overal bed mobility: Needs Assistance Bed Mobility: Supine to Sit     Supine to sit: Supervision, HOB elevated          Transfers Overall transfer level: Needs assistance Equipment used: 2 person hand held assist Transfers: Sit to/from Stand, Bed to chair/wheelchair/BSC Sit to Stand: Min assist, +2 physical assistance   Step pivot transfers: Min assist, +2 physical assistance            Ambulation/Gait                  Stairs            Wheelchair Mobility    Modified Rankin (Stroke Patients Only)       Balance Overall balance assessment: Needs assistance Sitting-balance support: Feet supported, Bilateral upper extremity supported Sitting balance-Leahy Scale: Fair Sitting balance - Comments: supervision static sitting   Standing balance support: Bilateral upper extremity supported, During functional activity Standing balance-Leahy Scale: Poor                               Pertinent Vitals/Pain Pain Assessment Pain Assessment: No/denies pain Faces Pain Scale: No hurt    Home Living Family/patient expects to be discharged to:: Private residence Living Arrangements: Spouse/significant other Available Help at Discharge: Family Type of Home: House Home Access:  Stairs to enter Entrance Stairs-Rails: Right Entrance Stairs-Number of Steps: 2 Alternate Level Stairs-Number of Steps: main level & basement Home Layout: Multi-level Home Equipment: Other (comment) Additional Comments: walking stick    Prior Function Prior Level of Function : Independent/Modified Independent              Mobility Comments: Independent without AD, can walk up to 3-4 miles/day       Hand Dominance        Extremity/Trunk Assessment   Upper Extremity Assessment Upper Extremity Assessment: Generalized weakness    Lower Extremity Assessment Lower Extremity Assessment: Generalized weakness       Communication   Communication: No difficulties  Cognition Arousal/Alertness: Awake/alert Behavior During Therapy: WFL for tasks assessed/performed Overall Cognitive Status: Within Functional Limits for tasks assessed                                 General Comments: AxOx4, pleasant, follows commands        General Comments General comments (skin integrity, edema, etc.): Pt on 4L/min via nasal cannula.    Exercises     Assessment/Plan    PT Assessment Patient needs continued PT services  PT Problem List Decreased strength;Cardiopulmonary status limiting activity;Decreased range of motion;Decreased activity tolerance;Decreased balance;Decreased mobility;Decreased knowledge of use of DME;Decreased safety awareness       PT Treatment Interventions DME instruction;Therapeutic exercise;Balance training;Gait training;Stair training;Neuromuscular re-education;Functional mobility training;Therapeutic activities;Patient/family education    PT Goals (Current goals can be found in the Care Plan section)  Acute Rehab PT Goals Patient Stated Goal: get better PT Goal Formulation: With patient Time For Goal Achievement: 06/02/22 Potential to Achieve Goals: Good    Frequency Min 2X/week     Co-evaluation PT/OT/SLP Co-Evaluation/Treatment: Yes Reason for Co-Treatment: Complexity of the patient's impairments (multi-system involvement);For patient/therapist safety PT goals addressed during session: Mobility/safety with mobility;Balance         AM-PAC PT "6 Clicks" Mobility  Outcome Measure Help needed turning from your back to your side while in a flat bed without using  bedrails?: None Help needed moving from lying on your back to sitting on the side of a flat bed without using bedrails?: A Little Help needed moving to and from a bed to a chair (including a wheelchair)?: A Little Help needed standing up from a chair using your arms (e.g., wheelchair or bedside chair)?: A Little Help needed to walk in hospital room?: A Lot Help needed climbing 3-5 steps with a railing? : A Lot 6 Click Score: 17    End of Session   Activity Tolerance: Patient tolerated treatment well Patient left: in chair;with call bell/phone within reach Nurse Communication: Mobility status PT Visit Diagnosis: Unsteadiness on feet (R26.81);Muscle weakness (generalized) (M62.81);Difficulty in walking, not elsewhere classified (R26.2)    Time: 4098-1191 PT Time Calculation (min) (ACUTE ONLY): 16 min   Charges:   PT Evaluation $PT Eval High Complexity: 1 High          Lavone Nian, PT, DPT 05/19/22, 3:51 PM   [name] 05/19/2022, 3:44 PM

## 2022-05-19 NOTE — Progress Notes (Addendum)
Initial Nutrition Assessment  DOCUMENTATION CODES:   Not applicable  INTERVENTION:   Recommend interim TPN to provide nutrition while work-up and determination of GOC are in process.  Pt is already receiving thiamine daily  Pt at high refeed risk  Daily weights   NUTRITION DIAGNOSIS:   Inadequate oral intake related to dysphagia as evidenced by NPO status.  GOAL:   Patient will meet greater than or equal to 90% of their needs  MONITOR:   Labs, Weight trends, Skin, I & O's  REASON FOR ASSESSMENT:   Ventilator    ASSESSMENT:   77 y.o. male with a history of PSVT, HTN, HLD, IDA, etoh abuse and colon cancer s/p robot-assisted laparoscopic right partial colectomy 08/21/20 who is admitted with esophageal dysphagia, anemia, GIB and flu A. 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  Visited pt's room today. Pt is currently ventilated. Pt is alert and awake and is able to shake his head yes and no. Wife at bedside reports pt has had decreased oral intake over the past two months. Wife reports that pt has been having difficulty getting food down as food has been getting stuck in his chest area. Wife reports that patient has lost ~20lbs over the past several months. Per chart, pt is down 10lbs(5%) since November. Plan is for repeat EGD with biopsies once pt is stable (possibly tomorrow). Plan is for extubation today. Pt does not have any feeding access. NGT likely unable to be placed r/t esophageal mass. Pt will need G-tube/J-tube pending on his plan of care and goals. Would recommend TPN in the interim to provide nutrition while work-up and determination of GOC are in process. Pt is at high refeed risk. Pt at high risk for developing malnutrition.     Medications reviewed and include: folic acid, protonix, thiamine, albumin, LRS '@100ml'$ /hr, phenylephrine, vasopressin   Labs reviewed: BUN 45(H), K 4.7  wnl, P 4.6 wnl, Mg 2.4 wnl B12 1156(H), folate 14.5(H)- 1/10 Hgb 7.8(L), Hct 23.7(L) Cbgs- 148, 114, 167 x 24 hrs   NUTRITION - FOCUSED PHYSICAL EXAM:  Flowsheet Row Most Recent Value  Orbital Region No depletion  Upper Arm Region Moderate depletion  Thoracic and Lumbar Region No depletion  Buccal Region No depletion  Temple Region Mild depletion  Clavicle Bone Region Mild depletion  Clavicle and Acromion Bone Region Mild depletion  Scapular Bone Region Mild depletion  Dorsal Hand Unable to assess  Patellar Region Mild depletion  Anterior Thigh Region Mild depletion  Posterior Calf Region Mild depletion  Edema (RD Assessment) None  Hair Reviewed  Eyes Reviewed  Mouth Reviewed  Skin Reviewed  Nails Reviewed   Diet Order:   Diet Order             Diet NPO time specified  Diet effective midnight                  EDUCATION NEEDS:   Not appropriate for education at this time  Skin:  Skin Assessment: Reviewed RN Assessment  Last BM:  1/11- type 7  Height:   Ht Readings from Last 1 Encounters:  05/18/22 '5\' 9"'$  (1.753 m)    Weight:   Wt Readings from Last 1 Encounters:  05/18/22 79.2 kg    Ideal Body Weight:  72.7 kg  BMI:  Body mass index is 25.78 kg/m.  Estimated Nutritional Needs:   Kcal:  1900-2200kcal/day  Protein:  95-110g/day  Fluid:  1.9-2.2L/day  Estefan Pattison MS, RD, LDN Please refer to [facility] for RD and/or RD on-call/weekend/after hours pager  

## 2022-05-19 NOTE — Consult Note (Signed)
Bobby Moreno  [phone] [phone]  ID: [name] OB: [birth year]  MR#: [id number]  VQX#:[id number]  Patient Care Team: Zeb Comfort, MD as PCP - General (Family Medicine)  CHIEF COMPLAINT: Esophageal mass.  INTERVAL HISTORY: Patient is a 77 year old male who presented to the emergency room with syncope and found to have a supraventricular tachycardia with heart rate greater than 200.  He also complained of increasing shortness of breath and was found to be positive for influenza A.  Patient developed respiratory distress and was subsequently intubated for approximately 24 hours.  He was extubated approximately 1 hour ago.  He also complained of progressive dysphagia and weight loss since Thanksgiving as well as declining hemoglobin.  Subsequent EGD revealed esophageal mass highly suspicious for malignancy.   REVIEW OF SYSTEMS:   Review of Systems  Constitutional:  Positive for fever, malaise/fatigue and weight loss.  Respiratory:  Positive for shortness of breath. Negative for cough and hemoptysis.   Cardiovascular:  Positive for chest pain and leg swelling.  Gastrointestinal:  Positive for melena. Negative for abdominal pain, blood in stool, diarrhea, nausea and vomiting.  Genitourinary: Negative.  Negative for dysuria.  Musculoskeletal: Negative.  Negative for back pain.  Skin: Negative.  Negative for rash.  Neurological:  Positive for focal weakness and weakness. Negative for dizziness and headaches.  Psychiatric/Behavioral: Negative.  The patient is not nervous/anxious.     As per HPI. Otherwise, a complete review of systems is negative.  PAST MEDICAL HISTORY: Past Medical History:  Diagnosis Date   HLD (hyperlipidemia)    HTN (hypertension)    PSVT (paroxysmal supraventricular tachycardia)     PAST SURGICAL HISTORY: Past Surgical History:  Procedure Laterality Date   CATARACT EXTRACTION, BILATERAL  2010    ESOPHAGOGASTRODUODENOSCOPY N/A 05/18/2022   Procedure: ESOPHAGOGASTRODUODENOSCOPY (EGD);  Surgeon: Lin Landsman, MD;  Location: [facility] ENDOSCOPY;  Service: Gastroenterology;  Laterality: N/A;    FAMILY HISTORY: Family History  Problem Relation Age of Onset   Hypertension Mother    Dementia Mother    Prostate cancer Father    Emphysema Father     ADVANCED DIRECTIVES (Y/N):  '@ADVDIR'$ @  HEALTH MAINTENANCE: Social History   Tobacco Use   Smoking status: Former   Smokeless tobacco: Never  Substance Use Topics   Alcohol use: Yes    Alcohol/week: 7.0 standard drinks of alcohol    Types: 7 Cans of beer per week   Drug use: Never     Colonoscopy:  PAP:  Bone density:  Lipid panel:  No Known Allergies  Current Facility-Administered Medications  Medication Dose Route Frequency Provider Last Rate Last Admin   0.9 %  sodium chloride infusion (Manually program via Guardrails IV Fluids)   Intravenous Once Teressa Lower, NP       0.9 %  sodium chloride infusion  250 mL Intravenous Continuous Teressa Lower, NP 10 mL/hr at 05/19/22 1200 Infusion Verify at 05/19/22 1200   acetaminophen (TYLENOL) tablet 650 mg  650 mg Per Tube Q6H PRN Rust-Chester, Huel Cote, NP       Or   acetaminophen (TYLENOL) suppository 650 mg  650 mg Rectal Q6H PRN Rust-Chester, Britton L, NP       albumin human 25 % solution 25 g  25 g Intravenous Q6H Rust-Chester, Huel Cote, NP   Stopped at 05/19/22 0912   Chlorhexidine Gluconate Cloth 2 % PADS 6 each  6 each Topical Daily Emeterio Reeve, DO   6 each at  05/19/22 1233   fentaNYL (SUBLIMAZE) bolus via infusion 25-100 mcg  25-100 mcg Intravenous Q15 min PRN Rust-Chester, Britton L, NP       fentaNYL 2533mg in NS 2536m(1059mml) infusion-PREMIX  25-200 mcg/hr Intravenous Continuous Rust-Chester, BriHuel CoteP   Stopped at 05/31/95/9829211folic acid injection 1 mg  1 mg Intravenous Daily NelTeressa LowerP       lactated ringers infusion   Intravenous  Continuous Rust-Chester, BriHuel CoteP 100 mL/hr at 05/19/22 1236 New Bag at 05/19/22 1236   midazolam (VERSED) injection 1-2 mg  1-2 mg Intravenous Q1H PRN Rust-Chester, BriToribio Harbour NP   2 mg at 05/18/22 2218   ondansetron (ZOFRAN) tablet 4 mg  4 mg Per Tube Q6H PRN Rust-Chester, BriHuel CoteP       Or   ondansetron (ZOFRAN) injection 4 mg  4 mg Intravenous Q6H PRN Rust-Chester, BriHuel CoteP       Oral care mouth rinse  15 mL Mouth Rinse Q2H Rust-Chester, Britton L, NP   15 mL at 05/19/22 1233   Oral care mouth rinse  15 mL Mouth Rinse PRN Rust-Chester, BriHuel CoteP       [START ON 05/22/2022] pantoprazole (PROTONIX) injection 40 mg  40 mg Intravenous Q12H NelTeressa LowerP       pantoprozole (PROTONIX) 80 mg /NS 100 mL infusion  8 mg/hr Intravenous Continuous NelTeressa LowerP 10 mL/hr at 05/19/22 1200 8 mg/hr at 05/19/22 1200   phenylephrine CONCENTRATED '100mg'$  in sodium chloride 0.9% 250m55m.'4mg'$ /mL) premix infusion  0-400 mcg/min Intravenous Titrated Rust-Chester, Britton L, NP 22.5 mL/hr at 05/19/22 1200 150 mcg/min at 05/19/22 1200   pravastatin (PRAVACHOL) tablet 40 mg  40 mg Per Tube QHS Rust-Chester, Britton L, NP       propofol (DIPRIVAN) 1000 MG/100ML infusion  5-50 mcg/kg/min Intravenous Titrated Rust-Chester, Britton L, NP   Stopped at 05/19/22 1024   senna-docusate (Senokot-S) tablet 1 tablet  1 tablet Oral QHS PRN Cox, Amy N, DO       thiamine (VITAMIN B1) injection 100 mg  100 mg Intravenous Daily NelsTeressa Lower   100 mg at 05/19/22 1236   vasopressin (PITRESSIN) 20 Units in sodium chloride 0.9 % 100 mL infusion-*FOR SHOCK*  0-0.04 Units/min Intravenous Continuous Rust-Chester, Britton L, NP 3 mL/hr at 05/19/22 1200 0.01 Units/min at 05/19/22 1200    OBJECTIVE: Vitals:   05/19/22 1200 05/19/22 1208  BP: 108/84   Pulse: 88   Resp: 18   Temp:  98.4 F (36.9 C)  SpO2: 93%      Body mass index is 25.78 kg/m.    ECOG FS:4 - Bedbound  General: Ill-appearing, mild  respiratory distress. Eyes: Pink conjunctiva, anicteric sclera. HEENT: Normocephalic, moist mucous membranes. Lungs: No audible wheezing or coughing. Heart: Regular rate and rhythm. Abdomen: Soft, nontender, no obvious distention. Musculoskeletal: No edema, cyanosis, or clubbing. Neuro: Alert, answering all questions appropriately. Cranial nerves grossly intact. Skin: No rashes or petechiae noted. Psych: Normal affect.  LAB RESULTS:  Lab Results  Component Value Date   NA 138 05/19/2022   K 4.7 05/19/2022   CL 109 05/19/2022   CO2 22 05/19/2022   GLUCOSE 185 (H) 05/19/2022   BUN 45 (H) 05/19/2022   CREATININE 0.97 05/19/2022   CALCIUM 7.5 (L) 05/19/2022   PROT 4.2 (L) 05/19/2022   ALBUMIN 2.1 (L) 05/19/2022   AST 34 05/19/2022   ALT 19 05/19/2022  ALKPHOS 34 (L) 05/19/2022   BILITOT 0.3 05/19/2022   GFRNONAA >60 05/19/2022    Lab Results  Component Value Date   WBC 11.5 (H) 05/19/2022   NEUTROABS 7.3 05/17/2022   HGB 7.8 (L) 05/19/2022   HCT 23.7 (L) 05/19/2022   MCV 88.2 05/19/2022   PLT 215 05/19/2022     STUDIES: DG Chest Port 1 View  Result Date: 05/18/2022 CLINICAL DATA:  Status post central line placement. EXAM: PORTABLE CHEST 1 VIEW COMPARISON:  May 17, 2022 FINDINGS: Endotracheal tube is seen with its distal tip approximately 5.5 cm from the carina. A left internal jugular venous catheter is noted with its distal tip near the junction of the superior vena cava and right atrium. The heart size and mediastinal contours are within normal limits. Mild bilateral infrahilar atelectasis is noted. There is no evidence of a pleural effusion or pneumothorax. The visualized skeletal structures are unremarkable. IMPRESSION: 1. Endotracheal tube and left internal jugular venous catheter positioning, as described above. 2. Mild bilateral infrahilar atelectasis. Electronically Signed   By: Virgina Norfolk M.D.   On: 05/18/2022 21:55   CT ANGIO GI BLEED  Result Date:  05/18/2022 CLINICAL DATA:  GI bleeding. EXAM: CTA ABDOMEN AND PELVIS WITHOUT AND WITH CONTRAST TECHNIQUE: Multidetector CT imaging of the abdomen and pelvis was performed using the standard protocol during bolus administration of intravenous contrast. Multiplanar reconstructed images and MIPs were obtained and reviewed to evaluate the vascular anatomy. RADIATION DOSE REDUCTION: This exam was performed according to the departmental dose-optimization program which includes automated exposure control, adjustment of the mA and/or kV according to patient size and/or use of iterative reconstruction technique. CONTRAST:  166m OMNIPAQUE IOHEXOL 350 MG/ML SOLN COMPARISON:  CT abdomen and pelvis 07/10/2020 FINDINGS: VASCULAR Aorta: Normal caliber aorta without aneurysm, dissection, vasculitis or significant stenosis. Are atherosclerotic calcifications of the aorta. Celiac: Patent without evidence of aneurysm, dissection, vasculitis or significant stenosis. SMA: Patent without evidence of aneurysm, dissection, vasculitis or significant stenosis. There is mild noncalcified atherosclerotic disease in the proximal SMA. Renals: Both renal arteries are patent without evidence of aneurysm, dissection, vasculitis, fibromuscular dysplasia or significant stenosis. Accessory left renal artery present. IMA: Patent without evidence of aneurysm, dissection, vasculitis or significant stenosis. Inflow: Patent without evidence of aneurysm, dissection, vasculitis or significant stenosis. Proximal Outflow: Not well opacified secondary to timing of the contrast bolus. Veins: No obvious venous abnormality within the limitations of this arterial phase study. Review of the MIP images confirms the above findings. NON-VASCULAR Lower chest: There is peribronchial wall thickening and plugging in the right lower lobe with a small amount of airspace disease. Hepatobiliary: There is a 4.8 x 4.1 cm cyst in the inferior right liver. There is an  indeterminate rim enhancing lesion in the left lobe of the liver measuring 15 mm seen on image 17/24. There is a second similar appearing lesion in the left lobe of the liver measuring 12 mm image 17/30. Gallbladder and bile ducts are within normal limits. Pancreas: Unremarkable. No pancreatic ductal dilatation or surrounding inflammatory changes. Spleen: Normal in size without focal abnormality. Adrenals/Urinary Tract: Are 2 left renal calculi measuring up 2 9 mm. There is left renal cortical scarring. There is no hydronephrosis in either kidney. There are scattered subcentimeter cortical cysts in both kidneys. The adrenal glands and bladder are within normal limits. Stomach/Bowel: The stomach is moderately distended. There is an area of active bleeding seen within the proximal stomach near the gastroesophageal junction. Appendix appears normal. No  evidence of bowel wall thickening, distention, or inflammatory changes. The appendix is not visualized. Lymphatic: No enlarged lymph nodes are seen. Reproductive: Prostate gland is enlarged. Right-sided hydrocele present. Other: No abdominal wall hernia or abnormality. No abdominopelvic ascites. Musculoskeletal: There is moderate compression deformity of L2 which appears unchanged. IMPRESSION: 1. Active bleeding in the proximal stomach near the gastroesophageal junction. Aortic Atherosclerosis (ICD10-I70.0). NON-VASCULAR 1. Moderate distention of the stomach. 2. New indeterminate rim enhancing lesions in the left lobe of the liver. Recommend further evaluation with MRI. 3. Nonobstructing left renal calculi. 4. Prostatomegaly. 5. Right-sided hydrocele. These results were called by telephone at the time of interpretation on 05/18/2022 at 8:30 pm to provider BRITTON RUST-CHESTER , who verbally acknowledged these results. Electronically Signed   By: Ronney Asters M.D.   On: 05/18/2022 20:33   ECHOCARDIOGRAM COMPLETE  Result Date: 05/18/2022    ECHOCARDIOGRAM REPORT    Patient Name:   CASHIUS GRANDSTAFF Date of Exam: 05/18/2022 Medical Rec #:  [id number]   Height:       69.0 in Accession #:    [id number]  Weight:       175.0 lb Date of Birth:  11-18-1945   BSA:          1.952 m Patient Age:    21 years    BP:           121/76 mmHg Patient Gender: M           HR:           101 bpm. Exam Location:  [facility] Procedure: 2D Echo, Cardiac Doppler and Color Doppler Indications:     Syncope R55                  Elevated troponin  History:         Patient has no prior history of Echocardiogram examinations.                  Risk Factors:Hypertension and Dyslipidemia. PSVT.  Sonographer:     Sherrie Sport Referring Phys:  [id number] AMY N COX Diagnosing Phys: Kate Sable MD  Sonographer Comments: Suboptimal apical window and no subcostal window. IMPRESSIONS  1. Left ventricular ejection fraction, by estimation, is 65 to 70%. The left ventricle has normal function. The left ventricle has no regional wall motion abnormalities. There is mild left ventricular hypertrophy. Left ventricular diastolic parameters are consistent with Grade I diastolic dysfunction (impaired relaxation).  2. Right ventricular systolic function is normal. The right ventricular size is normal.  3. The mitral valve is normal in structure. No evidence of mitral valve regurgitation.  4. The aortic valve is tricuspid. Aortic valve regurgitation is mild. Aortic valve sclerosis/calcification is present, without any evidence of aortic stenosis.  5. Aortic dilatation noted. There is mild dilatation of the ascending aorta, measuring 42 mm. There is mild dilatation of the aortic root, measuring 39 mm. FINDINGS  Left Ventricle: Left ventricular ejection fraction, by estimation, is 65 to 70%. The left ventricle has normal function. The left ventricle has no regional wall motion abnormalities. The left ventricular internal cavity size was normal in size. There is  mild left ventricular hypertrophy. Left ventricular diastolic parameters are  consistent with Grade I diastolic dysfunction (impaired relaxation). Right Ventricle: The right ventricular size is normal. No increase in right ventricular wall thickness. Right ventricular systolic function is normal. Left Atrium: Left atrial size was normal in size. Right Atrium: Right atrial size was normal in size. Pericardium:  There is no evidence of pericardial effusion. Mitral Valve: The mitral valve is normal in structure. No evidence of mitral valve regurgitation. Tricuspid Valve: The tricuspid valve is normal in structure. Tricuspid valve regurgitation is trivial. Aortic Valve: The aortic valve is tricuspid. Aortic valve regurgitation is mild. Aortic valve sclerosis/calcification is present, without any evidence of aortic stenosis. Aortic valve mean gradient measures 3.0 mmHg. Aortic valve peak gradient measures 4.9 mmHg. Aortic valve area, by VTI measures 4.43 cm. Pulmonic Valve: The pulmonic valve was not well visualized. Pulmonic valve regurgitation is not visualized. Aorta: Aortic dilatation noted. There is mild dilatation of the ascending aorta, measuring 42 mm. There is mild dilatation of the aortic root, measuring 39 mm. Venous: The inferior vena cava was not well visualized. IAS/Shunts: No atrial level shunt detected by color flow Doppler.  LEFT VENTRICLE PLAX 2D LVIDd:         3.80 cm   Diastology LVIDs:         2.60 cm   LV e' medial:    9.90 cm/s LV PW:         1.20 cm   LV E/e' medial:  7.0 LV IVS:        1.20 cm   LV e' lateral:   10.60 cm/s LVOT diam:     2.20 cm   LV E/e' lateral: 6.6 LV SV:         67 LV SV Index:   34 LVOT Area:     3.80 cm  RIGHT VENTRICLE RV Basal diam:  2.70 cm RV Mid diam:    2.40 cm RV S prime:     18.00 cm/s TAPSE (M-mode): 2.0 cm LEFT ATRIUM             Index        RIGHT ATRIUM           Index LA diam:        3.40 cm 1.74 cm/m   RA Area:     16.20 cm LA Vol (A2C):   51.2 ml 26.23 ml/m  RA Volume:   41.20 ml  21.11 ml/m LA Vol (A4C):   36.9 ml 18.90 ml/m LA  Biplane Vol: 46.6 ml 23.87 ml/m  AORTIC VALVE AV Area (Vmax):    4.08 cm AV Area (Vmean):   4.04 cm AV Area (VTI):     4.43 cm AV Vmax:           111.00 cm/s AV Vmean:          77.000 cm/s AV VTI:            0.152 m AV Peak Grad:      4.9 mmHg AV Mean Grad:      3.0 mmHg LVOT Vmax:         119.00 cm/s LVOT Vmean:        81.800 cm/s LVOT VTI:          0.177 m LVOT/AV VTI ratio: 1.16  AORTA Ao Root diam: 3.94 cm MITRAL VALVE                TRICUSPID VALVE MV Area (PHT): 4.71 cm     TR Peak grad:   12.4 mmHg MV Decel Time: 161 msec     TR Vmax:        176.00 cm/s MV E velocity: 69.70 cm/s MV A velocity: 110.00 cm/s  SHUNTS MV E/A ratio:  0.63         Systemic  VTI:  0.18 m                             Systemic Diam: 2.20 cm Kate Sable MD Electronically signed by Kate Sable MD Signature Date/Time: 05/18/2022/2:48:12 PM    Final    DG Chest Port 1 View  Result Date: 05/17/2022 CLINICAL DATA:  Syncope EXAM: PORTABLE CHEST 1 VIEW COMPARISON:  None Available. FINDINGS: The heart size and mediastinal contours are within normal limits. Both lungs are clear. The visualized skeletal structures are unremarkable. IMPRESSION: No active disease. Electronically Signed   By: Ronney Asters M.D.   On: 05/17/2022 22:46    ASSESSMENT: Esophageal mass.  PLAN:    Esophageal mass: Noted on EGD, no biopsies were taken at this time.  Plan for repeat EGD with biopsy in the next 1 to 2 days when patient is more stable.  Ultimately, he will also require EUS for staging purposes.  CT angiogram reviewed independently and reported as above with active bleed approximately at the site of his mass. 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  Monitor and transfuse if hemoglobin falls below 7.0. Shortness of breath/influenza A: Patient intubated for approximately 24 hours.  Extubated earlier this afternoon.  Appreciate critical care input. History of colon cancer: Status post  resection, no chemotherapy was necessary. 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  Appreciate consult, will follow.   Lloyd Huger, MD   05/19/2022 1:48 PM

## 2022-05-19 NOTE — Progress Notes (Signed)
Bobby Moreno , MD [address], [place], [place], [place], [address] 3940 [address], [place], [place], [place], [address] Phone: [phone]  Fax: [phone]   [name] is being followed for upper GI bleed, syncope day 1 of follow up   Subjective:  Intubated But still able to nod and communicate.  He denies any complaints.  Denies any abdominal pain.  I spoke to nursing no further black or bloody bowel movements   Objective: Vital signs in last 24 hours: Vitals:   05/19/22 0645 05/19/22 0700 05/19/22 0732 05/19/22 0742  BP: 111/70 101/69  95/80  Pulse:      Resp: 11 12    Temp:   97.8 F (36.6 C)   TempSrc:   Oral   SpO2:    97%  Weight:      Height:       Weight change: -0.179 kg  Intake/Output Summary (Last 24 hours) at 05/19/2022 0850 Last data filed at 05/19/2022 0600 Gross per 24 hour  Intake 5305.55 ml  Output 860 ml  Net 4445.55 ml     Exam: Heart:: Regular rate and rhythm or S1S2 present Lungs: normal Abdomen: soft, nontender, normal bowel sounds   Lab Results: '@LABTEST2'$ @ Micro Results: Recent Results (from the past 240 hour(s))  Resp panel by RT-PCR (RSV, Flu A&B, Covid) Anterior Nasal Swab     Status: Abnormal   Collection Time: 05/17/22  7:53 PM   Specimen: Anterior Nasal Swab  Result Value Ref Range Status   SARS Coronavirus 2 by RT PCR NEGATIVE NEGATIVE Final    Comment: (NOTE) SARS-CoV-2 target nucleic acids are NOT DETECTED.  The SARS-CoV-2 RNA is generally detectable in upper respiratory specimens during the acute phase of infection. The lowest concentration of SARS-CoV-2 viral copies this assay can detect is 138 copies/mL. A negative result does not preclude SARS-Cov-2 infection and should not be used as the sole basis for treatment or other patient management decisions. A negative result may occur with  improper specimen collection/handling, submission of specimen other than nasopharyngeal swab, presence of viral mutation(s)  within the areas targeted by this assay, and inadequate number of viral copies(<138 copies/mL). A negative result must be combined with clinical observations, patient history, and epidemiological information. The expected result is Negative.  Fact Sheet for Patients:  EntrepreneurPulse.com.au  Fact Sheet for Healthcare Providers:  IncredibleEmployment.be  This test is no t yet approved or cleared by the [place] FDA and  has been authorized for detection and/or diagnosis of SARS-CoV-2 by FDA under an Emergency Use Authorization (EUA). This EUA will remain  in effect (meaning this test can be used) for the duration of the COVID-19 declaration under Section 564(b)(1) of the Act, 21 U.S.C.section 360bbb-3(b)(1), unless the authorization is terminated  or revoked sooner.       Influenza A by PCR POSITIVE (A) NEGATIVE Final   Influenza B by PCR NEGATIVE NEGATIVE Final    Comment: (NOTE) The Xpert Xpress SARS-CoV-2/FLU/RSV plus assay is intended as an aid in the diagnosis of influenza from Nasopharyngeal swab specimens and should not be used as a sole basis for treatment. Nasal washings and aspirates are unacceptable for Xpert Xpress SARS-CoV-2/FLU/RSV testing.  Fact Sheet for Patients: EntrepreneurPulse.com.au  Fact Sheet for Healthcare Providers: IncredibleEmployment.be  This test is not yet approved or cleared by the [place] FDA and has been authorized for detection and/or diagnosis of SARS-CoV-2 by FDA under an Emergency Use Authorization (EUA). This EUA will remain in effect (  meaning this test can be used) for the duration of the COVID-19 declaration under Section 564(b)(1) of the Act, 21 U.S.C. section 360bbb-3(b)(1), unless the authorization is terminated or revoked.     Resp Syncytial Virus by PCR NEGATIVE NEGATIVE Final    Comment: (NOTE) Fact Sheet for  Patients: EntrepreneurPulse.com.au  Fact Sheet for Healthcare Providers: IncredibleEmployment.be  This test is not yet approved or cleared by the [place] FDA and has been authorized for detection and/or diagnosis of SARS-CoV-2 by FDA under an Emergency Use Authorization (EUA). This EUA will remain in effect (meaning this test can be used) for the duration of the COVID-19 declaration under Section 564(b)(1) of the Act, 21 U.S.C. section 360bbb-3(b)(1), unless the authorization is terminated or revoked.  Performed at [facility], [address] [place], [place] [address]   MRSA Next Gen by PCR, Nasal     Status: None   Collection Time: 05/18/22  5:16 PM   Specimen: Nasal Mucosa; Nasal Swab  Result Value Ref Range Status   MRSA by PCR Next Gen NOT DETECTED NOT DETECTED Final    Comment: (NOTE) The GeneXpert MRSA Assay (FDA approved for NASAL specimens only), is one component of a comprehensive MRSA colonization surveillance program. It is not intended to diagnose MRSA infection nor to guide or monitor treatment for MRSA infections. Test performance is not FDA approved in patients less than 28 years old. Performed at [facility], [address] [place], [place] [address]    Studies/Results: [facility] Chest Port 1 View  Result Date: 05/18/2022 CLINICAL DATA:  Status post central line placement. EXAM: PORTABLE CHEST 1 VIEW COMPARISON:  May 17, 2022 FINDINGS: Endotracheal tube is seen with its distal tip approximately 5.5 cm from the carina. A left internal jugular venous catheter is noted with its distal tip near the junction of the superior vena cava and right atrium. The heart size and mediastinal contours are within normal limits. Mild bilateral infrahilar atelectasis is noted. There is no evidence of a pleural effusion or pneumothorax. The visualized skeletal structures are unremarkable. IMPRESSION: 1. Endotracheal tube and  left internal jugular venous catheter positioning, as described above. 2. Mild bilateral infrahilar atelectasis. Electronically Signed   By: Virgina Norfolk M.D.   On: 05/18/2022 21:55   CT ANGIO GI BLEED  Result Date: 05/18/2022 CLINICAL DATA:  GI bleeding. EXAM: CTA ABDOMEN AND PELVIS WITHOUT AND WITH CONTRAST TECHNIQUE: Multidetector CT imaging of the abdomen and pelvis was performed using the standard protocol during bolus administration of intravenous contrast. Multiplanar reconstructed images and MIPs were obtained and reviewed to evaluate the vascular anatomy. RADIATION DOSE REDUCTION: This exam was performed according to the departmental dose-optimization program which includes automated exposure control, adjustment of the mA and/or kV according to patient size and/or use of iterative reconstruction technique. CONTRAST:  15m OMNIPAQUE IOHEXOL 350 MG/ML SOLN COMPARISON:  CT abdomen and pelvis 07/10/2020 FINDINGS: VASCULAR Aorta: Normal caliber aorta without aneurysm, dissection, vasculitis or significant stenosis. Are atherosclerotic calcifications of the aorta. Celiac: Patent without evidence of aneurysm, dissection, vasculitis or significant stenosis. SMA: Patent without evidence of aneurysm, dissection, vasculitis or significant stenosis. There is mild noncalcified atherosclerotic disease in the proximal SMA. Renals: Both renal arteries are patent without evidence of aneurysm, dissection, vasculitis, fibromuscular dysplasia or significant stenosis. Accessory left renal artery present. IMA: Patent without evidence of aneurysm, dissection, vasculitis or significant stenosis. Inflow: Patent without evidence of aneurysm, dissection, vasculitis or significant stenosis. Proximal Outflow: Not well opacified secondary to  timing of the contrast bolus. Veins: No obvious venous abnormality within the limitations of this arterial phase study. Review of the MIP images confirms the above findings. NON-VASCULAR  Lower chest: There is peribronchial wall thickening and plugging in the right lower lobe with a small amount of airspace disease. Hepatobiliary: There is a 4.8 x 4.1 cm cyst in the inferior right liver. There is an indeterminate rim enhancing lesion in the left lobe of the liver measuring 15 mm seen on image 17/24. There is a second similar appearing lesion in the left lobe of the liver measuring 12 mm image 17/30. Gallbladder and bile ducts are within normal limits. Pancreas: Unremarkable. No pancreatic ductal dilatation or surrounding inflammatory changes. Spleen: Normal in size without focal abnormality. Adrenals/Urinary Tract: Are 2 left renal calculi measuring up 2 9 mm. There is left renal cortical scarring. There is no hydronephrosis in either kidney. There are scattered subcentimeter cortical cysts in both kidneys. The adrenal glands and bladder are within normal limits. Stomach/Bowel: The stomach is moderately distended. There is an area of active bleeding seen within the proximal stomach near the gastroesophageal junction. Appendix appears normal. No evidence of bowel wall thickening, distention, or inflammatory changes. The appendix is not visualized. Lymphatic: No enlarged lymph nodes are seen. Reproductive: Prostate gland is enlarged. Right-sided hydrocele present. Other: No abdominal wall hernia or abnormality. No abdominopelvic ascites. Musculoskeletal: There is moderate compression deformity of L2 which appears unchanged. IMPRESSION: 1. Active bleeding in the proximal stomach near the gastroesophageal junction. Aortic Atherosclerosis (ICD10-I70.0). NON-VASCULAR 1. Moderate distention of the stomach. 2. New indeterminate rim enhancing lesions in the left lobe of the liver. Recommend further evaluation with MRI. 3. Nonobstructing left renal calculi. 4. Prostatomegaly. 5. Right-sided hydrocele. These results were called by telephone at the time of interpretation on 05/18/2022 at 8:30 pm to provider  BRITTON RUST-CHESTER , who verbally acknowledged these results. Electronically Signed   By: Ronney Asters M.D.   On: 05/18/2022 20:33   ECHOCARDIOGRAM COMPLETE  Result Date: 05/18/2022    ECHOCARDIOGRAM REPORT   Patient Name:   CHIBUEZE BEASLEY Date of Exam: 05/18/2022 Medical Rec #:  [id number]   Height:       69.0 in Accession #:    [id number]  Weight:       175.0 lb Date of Birth:  04/12/1946   BSA:          1.952 m Patient Age:    58 years    BP:           121/76 mmHg Patient Gender: M           HR:           101 bpm. Exam Location:  [facility] Procedure: 2D Echo, Cardiac Doppler and Color Doppler Indications:     Syncope R55                  Elevated troponin  History:         Patient has no prior history of Echocardiogram examinations.                  Risk Factors:Hypertension and Dyslipidemia. PSVT.  Sonographer:     Sherrie Sport Referring Phys:  [id number] AMY N COX Diagnosing Phys: Kate Sable MD  Sonographer Comments: Suboptimal apical window and no subcostal window. IMPRESSIONS  1. Left ventricular ejection fraction, by estimation, is 65 to 70%. The left ventricle has normal function. The left ventricle has no regional wall motion abnormalities.  There is mild left ventricular hypertrophy. Left ventricular diastolic parameters are consistent with Grade I diastolic dysfunction (impaired relaxation).  2. Right ventricular systolic function is normal. The right ventricular size is normal.  3. The mitral valve is normal in structure. No evidence of mitral valve regurgitation.  4. The aortic valve is tricuspid. Aortic valve regurgitation is mild. Aortic valve sclerosis/calcification is present, without any evidence of aortic stenosis.  5. Aortic dilatation noted. There is mild dilatation of the ascending aorta, measuring 42 mm. There is mild dilatation of the aortic root, measuring 39 mm. FINDINGS  Left Ventricle: Left ventricular ejection fraction, by estimation, is 65 to 70%. The left ventricle has normal function.  The left ventricle has no regional wall motion abnormalities. The left ventricular internal cavity size was normal in size. There is  mild left ventricular hypertrophy. Left ventricular diastolic parameters are consistent with Grade I diastolic dysfunction (impaired relaxation). Right Ventricle: The right ventricular size is normal. No increase in right ventricular wall thickness. Right ventricular systolic function is normal. Left Atrium: Left atrial size was normal in size. Right Atrium: Right atrial size was normal in size. Pericardium: There is no evidence of pericardial effusion. Mitral Valve: The mitral valve is normal in structure. No evidence of mitral valve regurgitation. Tricuspid Valve: The tricuspid valve is normal in structure. Tricuspid valve regurgitation is trivial. Aortic Valve: The aortic valve is tricuspid. Aortic valve regurgitation is mild. Aortic valve sclerosis/calcification is present, without any evidence of aortic stenosis. Aortic valve mean gradient measures 3.0 mmHg. Aortic valve peak gradient measures 4.9 mmHg. Aortic valve area, by VTI measures 4.43 cm. Pulmonic Valve: The pulmonic valve was not well visualized. Pulmonic valve regurgitation is not visualized. Aorta: Aortic dilatation noted. There is mild dilatation of the ascending aorta, measuring 42 mm. There is mild dilatation of the aortic root, measuring 39 mm. Venous: The inferior vena cava was not well visualized. IAS/Shunts: No atrial level shunt detected by color flow Doppler.  LEFT VENTRICLE PLAX 2D LVIDd:         3.80 cm   Diastology LVIDs:         2.60 cm   LV e' medial:    9.90 cm/s LV PW:         1.20 cm   LV E/e' medial:  7.0 LV IVS:        1.20 cm   LV e' lateral:   10.60 cm/s LVOT diam:     2.20 cm   LV E/e' lateral: 6.6 LV SV:         67 LV SV Index:   34 LVOT Area:     3.80 cm  RIGHT VENTRICLE RV Basal diam:  2.70 cm RV Mid diam:    2.40 cm RV S prime:     18.00 cm/s TAPSE (M-mode): 2.0 cm LEFT ATRIUM              Index        RIGHT ATRIUM           Index LA diam:        3.40 cm 1.74 cm/m   RA Area:     16.20 cm LA Vol (A2C):   51.2 ml 26.23 ml/m  RA Volume:   41.20 ml  21.11 ml/m LA Vol (A4C):   36.9 ml 18.90 ml/m LA Biplane Vol: 46.6 ml 23.87 ml/m  AORTIC VALVE AV Area (Vmax):    4.08 cm AV Area (Vmean):   4.04 cm AV  Area (VTI):     4.43 cm AV Vmax:           111.00 cm/s AV Vmean:          77.000 cm/s AV VTI:            0.152 m AV Peak Grad:      4.9 mmHg AV Mean Grad:      3.0 mmHg LVOT Vmax:         119.00 cm/s LVOT Vmean:        81.800 cm/s LVOT VTI:          0.177 m LVOT/AV VTI ratio: 1.16  AORTA Ao Root diam: 3.94 cm MITRAL VALVE                TRICUSPID VALVE MV Area (PHT): 4.71 cm     TR Peak grad:   12.4 mmHg MV Decel Time: 161 msec     TR Vmax:        176.00 cm/s MV E velocity: 69.70 cm/s MV A velocity: 110.00 cm/s  SHUNTS MV E/A ratio:  0.63         Systemic VTI:  0.18 m                             Systemic Diam: 2.20 cm Kate Sable MD Electronically signed by Kate Sable MD Signature Date/Time: 05/18/2022/2:48:12 PM    Final    DG Chest Port 1 View  Result Date: 05/17/2022 CLINICAL DATA:  Syncope EXAM: PORTABLE CHEST 1 VIEW COMPARISON:  None Available. FINDINGS: The heart size and mediastinal contours are within normal limits. Both lungs are clear. The visualized skeletal structures are unremarkable. IMPRESSION: No active disease. Electronically Signed   By: Ronney Asters M.D.   On: 05/17/2022 22:46   Medications: I have reviewed the patient's current medications. Scheduled Meds:  Chlorhexidine Gluconate Cloth  6 each Topical Daily   docusate  100 mg Per Tube BID   EPINEPHrine       midazolam       mouth rinse  15 mL Mouth Rinse Q2H   [START ON 05/22/2022] pantoprazole  40 mg Intravenous Q12H   polyethylene glycol  17 g Per Tube Daily   pravastatin  40 mg Per Tube QHS   Continuous Infusions:  sodium chloride 10 mL/hr at 05/19/22 0600   albumin human 25 g (05/19/22 0738)    fentaNYL infusion INTRAVENOUS 150 mcg/hr (05/19/22 0629)   lactated ringers 100 mL/hr at 05/18/22 2225   pantoprazole 8 mg/hr (05/19/22 0600)   phenylephrine (NEO-SYNEPHRINE) Adult infusion 175 mcg/min (05/19/22 0629)   propofol (DIPRIVAN) infusion 15 mcg/kg/min (05/19/22 0630)   propofol     vasopressin 0.04 Units/min (05/19/22 0600)   PRN Meds:.acetaminophen **OR** acetaminophen, EPINEPHrine, fentaNYL, midazolam, midazolam, ondansetron **OR** ondansetron (ZOFRAN) IV, mouth rinse, propofol, senna-docusate   Assessment: Principal Problem:   Acute blood loss anemia (ABLA) Active Problems:   HTN (hypertension)   HLD (hyperlipidemia)   Near syncope   Iron deficiency anemia   Influenza A   Elevated troponin   Other dysphagia   Symptomatic anemia   GI bleed   Hypotension   Sinus tachycardia by electrocardiogram   Hemorrhagic shock (HCC)   Acute upper GI bleed   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 who was admitted with a syncopal episode was found to have iron deficiency anemia and during process of evaluation  developed melena and became hypotensive last night, CT angiogram showed bleeding from the proximal part of the stomach had to have an emergent upper endoscopy last night which showed a partially obstructive lesion in the GE junction extending to the cardia which was bleeding, hemostasis was achieved with Hemospray.  The appearance is concerning for malignancy.  Plan: 1.  I discussed the plan with Dr. Mortimer Fries since he has had an upper endoscopy less than 12 hours back did not feel comfortable to take biopsies at the site of bleeding at this point of time.  Once we see that his hemoglobin is stable indicating no active bleeding ,  we go back in and take biopsies to obtain pathology..  In the interim if needed can obtain CT scan of the chest abdomen and pelvis.  Lets plan to keep him n.p.o. from midnight and if stable we can plan to do an upper endoscopy  and biopsy tomorrow morning before noon  2.  Monitor CBC and transfuse as needed   LOS: 2 days   Bobby Bellows, MD 05/19/2022, 8:50 AM

## 2022-05-19 NOTE — Progress Notes (Signed)
OT Cancellation Note  Patient Details Name: Morey Andonian MRN: [id number] DOB: 12-Nov-1945   Cancelled Treatment:    Reason Eval/Treat Not Completed: Medical issues which prohibited therapy. Order received and chart reviewed. Pt noted to be in ICU & intubated yesterday, with possible plans to extubate pt later today. Will hold OT evaluation until determined if pt will be extubated today or not.   Darleen Crocker, [place], OTR/L , CBIS ascom [phone]  05/19/22, 8:55 AM

## 2022-05-19 NOTE — Progress Notes (Signed)
PT Cancellation Note  Patient Details Name: Bobby Moreno MRN: [id number] DOB: [birth year]   Cancelled Treatment:    Reason Eval/Treat Not Completed: Patient not medically ready PT orders received, chart reviewed. Pt noted to be in ICU & intubated yesterday, with possible plans to extubate pt later today. Will hold PT evaluation until determined if pt will be extubated today or not.  Lavone Nian, PT, DPT 05/19/22, 8:30 AM   [name] 05/19/2022, 8:29 AM

## 2022-05-19 NOTE — Progress Notes (Signed)
NAME:  Bobby Moreno, MRN:  [id number], DOB:  1946/04/26, LOS: 2 ADMISSION DATE:  05/17/2022, CONSULTATION DATE: 05/18/22 REFERRING MD: Dr. Sheppard Coil, CHIEF COMPLAINT: GI Bleed    History of Present Illness:  This is a 77 yo male with a hx of Paroxysmal SVT, HTN, and Anemia.  He presented to [facility] ER via EMS with palpitations.  Upon EMS arrival at pts home he was found to be in SVT and hypotensive initial bp 70/40.  Attempted vagal maneuvers without improvement. He received 6 mg of adenosine followed by 12 mg of adenosine and converted to normal sinus rhythm.  He denies taking blood thinners, aspirin, or ibuprofen.  He does endorse drinking daily 2 beers and a glass of liquor.  His last alcoholic beverage was 2 weeks ago   ED Course  Upon arrival to the ER pt tachycardic.  EKG revealed sinus tachycardia, heart rate 127, no evidence of ischemia.  Pt incidentally found to be influenza positive.  Pt initially admitted to the progressive care unit per hospitalist team for additional workup and treatment, however he remained in the ER pending bed availability.  See detailed hospital course under significant events.   Initial vital signs: Temp 100.5, bp 122/76, hr 126, O2 sats 93% on RA  Significant labs: glucose 115/BUN 31/calcium 7.4/troponin 52/hgb 8.5 CXR: No active disease   Pertinent  Medical History  HTN HLD PSVT   Significant Hospital Events: Including procedures, antibiotic start and stop dates in addition to other pertinent events   01/9: Pt admitted to the progressive care unit with SVT and influenza A, however remained in the ER pending bed availability.  While in the ER pt had a syncopal episode x2  01/10: GI consulted for worsening anemia, syncope.  Pt had another witnessed syncopal episode with brief episode of unresponsive and became pale/diaphoretic/hypotensive Code Blue initiated.  Noted to have acute GI bleed with maroon colored stools.  GI aware recommended CT Angiogram 01/10:  CTA>>Active bleeding in the proximal stomach near the gastroesophageal       junction. Aortic Atherosclerosis (ICD10-I70.0). Moderate distention of the stomach.       New indeterminate rim enhancing lesions in the left lobe of the liver. Recommend        further evaluation with MRI. Nonobstructing left renal calculi. Prostatomegaly.        Right-sided hydrocele. Pt mechanically intubated for EGD.  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 Hemostatic  spray applied. Injected. Red        blood in the gastric fundus. Normal gastric body and antrum. Normal duodenal        bulb and second portion of the duodenum. Will need to repeat EGD for tissue             diagnosis once medically stable  01/11: Pt remains mechanically intubated on minimal ventilator settings.  Requst        GI repeat EGD for tissue diagnosis today while mechanically intubated.  Post        procedure will plan for WUA and SBT with possible extubation   Interim History / Subjective:  As outlined above   Objective   Blood pressure 95/80, pulse (!) 102, temperature 97.8 F (36.6 C), temperature source Oral, resp. rate 12, height '5\' 9"'$  (1.753 m), weight 79.2 kg, SpO2 97 %.    Vent Mode: PRVC FiO2 (%):  [35 %-50 %] 35 % Set Rate:  [  12 bmp-15 bmp] 12 bmp Vt Set:  [500 mL] 500 mL PEEP:  [5 cmH20] 5 cmH20 Plateau Pressure:  [10 cmH20] 10 cmH20   Intake/Output Summary (Last 24 hours) at 05/19/2022 2671 Last data filed at 05/19/2022 0600 Gross per 24 hour  Intake 5305.55 ml  Output 860 ml  Net 4445.55 ml   Filed Weights   05/17/22 1836 05/18/22 1700  Weight: 79.4 kg 79.2 kg    Examination: General: Acutely-ill pale appearing elderly male, NAD mechanically intubated  HENT: Supple, no JVD Lungs: Clear throughout, even, non labored  Cardiovascular: Sinus tachycardia, no m/r/g, 2+ radial/2+ distal pulses, no edema  Abdomen: +BS x4, soft, non  tender, non distended  Extremities: Normal bulk and tone, moves all extremities  Neuro: Sedated, following commands during WUA, PERRL GU: Indwelling foley catheter draining clear yellow urine   Resolved Hospital Problem list     Assessment & Plan:  Pre-Syncope secondary to ABLA in the setting of Acute Gastrointestinal Bleed secondary to large fungating submucosal ulcerating mass s/p EGD with injection of epinephrine  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 Per last cardiology note patient was not interested in previously offered ablation. Echo 05/18/22: LVEF 65-70%, G1DD, mild dilatation in aorta & aortic root - Continue pravastatin - Hold outpatient regimen due to hypotension and ABLA, consider restarting as patient stabilizes >> metoprolol - Continuous cardiac monitoring  Mechanical intubation for airway protection during EGD  Influenza A infection - Full vent support for now: vent settings reviewed and established  - Continue lung protective strategies  - SBT once all parameters met  - VAP bundle implemented  - Maintain RASS goal 0 to -1 - PAD protocol to maintain RASS goal: Propofol and fentanyl gtts to maintain RASS goal  - WUA daily   Daily ETOH use Patient stated his last drink was 2 weeks ago - Consider CIWA if patient shows signs of DT's, but he should be outside the window at this point -  Monitor electrolytes closely, daily BMP, Mg, Phos- replace PRN - Supportive care  Best Practice (right click and "Reselect all SmartList Selections" daily)  Diet/type: NPO DVT prophylaxis: SCD GI prophylaxis: PPI Lines: Left IJ CVL  Foley:  Yes and still needed  Code Status:  full code Last date of multidisciplinary goals of care discussion [05/19/22]  01/11: Will update pts wife when she arrives at bedside  Labs   CBC: Recent Labs  Lab 05/17/22 1847 05/18/22 0405 05/18/22 1623 05/18/22 2037 05/19/22 0107 05/19/22 0458  WBC 8.9 5.6 7.9 7.7  --  11.5*  NEUTROABS 7.3  --   --   --   --   --   HGB 9.8* 8.5* 7.1* 7.4* 8.5* 8.6*  HCT 30.8* 27.0* 22.8* 22.8* 25.0* 26.1*  MCV 96.3 96.8 97.0 88.4  --  88.2  PLT 255 222 192 191  --  245    Basic Metabolic Panel: Recent  Labs  Lab 05/17/22 1847 05/18/22 0405 05/18/22 1623 05/18/22 2036 05/18/22 2037 05/19/22 0458  NA 132* 135 135  --  135 138  K 4.0 4.1 4.3  --  4.2 4.7  CL 101 105 109  --  107 109  CO2 23 24 20*  --  14* 22  GLUCOSE 154* 115* 175*  --  175* 185*  BUN 23 31* 37*  --  41* 45*  CREATININE 0.87 0.70 0.76  --  0.82 0.97  CALCIUM 8.1* 7.4* 7.0*  --  6.9* 7.5*  MG  --   --   --  1.7  --  2.4  PHOS  --   --   --  3.0  --  4.6   GFR: Estimated Creatinine Clearance: 66.8 mL/min (by C-G formula based on SCr of 0.97 mg/dL). Recent Labs  Lab 05/18/22 0405 05/18/22 1623 05/18/22 2036 05/18/22 2037 05/19/22 0107 05/19/22 0458 05/19/22 0500  WBC 5.6 7.9  --  7.7  --  11.5*  --   LATICACIDVEN  --   --  6.5*  --  2.8*  --  2.8*    Liver Function Tests: Recent Labs  Lab 05/17/22 1847 05/19/22 0458  AST 37 34  ALT 23 19  ALKPHOS 57 34*  BILITOT 0.4 0.3  PROT 6.3* 4.2*  ALBUMIN 3.1* 2.1*   No results for input(s): "LIPASE", "AMYLASE" in the last 168 hours. No results for input(s): "AMMONIA" in the last 168 hours.  ABG    Component Value Date/Time   PHART 7.4 05/18/2022 2135   PCO2ART 31 (L)  05/18/2022 2135   PO2ART 163 (H) 05/18/2022 2135   HCO3 19.2 (L) 05/18/2022 2135   ACIDBASEDEF 4.5 (H) 05/18/2022 2135   O2SAT 98.7 05/18/2022 2135     Coagulation Profile: Recent Labs  Lab 05/18/22 2036  INR 1.4*    Cardiac Enzymes: No results for input(s): "CKTOTAL", "CKMB", "CKMBINDEX", "TROPONINI" in the last 168 hours.  HbA1C: No results found for: "HGBA1C"  CBG: Recent Labs  Lab 05/17/22 2002 05/18/22 1316 05/18/22 2341 05/19/22 0349 05/19/22 0722  GLUCAP 134* 153* 163* 167* 114*    Review of Systems:   Unable to assess pt mechanically intubated   Past Medical History:  He,  has a past medical history of HLD (hyperlipidemia), HTN (hypertension), and PSVT (paroxysmal supraventricular tachycardia).   Surgical History:   Past Surgical History:  Procedure Laterality Date   CATARACT EXTRACTION, BILATERAL  2010     Social History:   reports that he has quit smoking. He has never used smokeless tobacco. He reports current alcohol use of about 7.0 standard drinks of alcohol per week. He reports that he does not use drugs.   Family History:  His family history includes Dementia in his mother; Emphysema in his father; Hypertension in his mother; Prostate cancer in his father.   Allergies No Known Allergies   Home Medications  Prior to Admission medications   Medication Sig Start Date End Date Taking? Authorizing Provider  metoprolol succinate (TOPROL-XL) 50 MG 24 hr tablet Take 50 mg by mouth daily. 04/27/22 04/27/23 Yes [provider]  pravastatin (PRAVACHOL) 40 MG tablet Take 40 mg by mouth at bedtime. 04/27/22  Yes [provider]  nitroGLYCERIN (NITROSTAT) 0.4 MG SL tablet Place 0.4 mg under the tongue every 5 (five) minutes as needed for chest pain (May take up to 3 doses.).    [provider]     Critical care time: 31  minutes      Donell Beers, [place] Pager [phone] (please enter 7  digits) PCCM Consult Pager [phone] (please enter 7 digits)

## 2022-05-19 NOTE — Evaluation (Signed)
Occupational Therapy Evaluation Patient Details Name: Bobby Moreno MRN: [id number] DOB: 1946/04/21 Today's Date: 05/19/2022   History of Present Illness Pt is a 77 y/o M admitted on 05/17/22 after presenting to the ED with chief concerns of syncope. Pt tested + for Flu A & was found to have sinus tachycardia. While in ED waiting for a bed pt had 3 syncopal episodes, becoming unresponsive & Code Blue initiated on the 3rd. Pt noted to have acute GI bleed with maroon colored stools. 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  PMH: HTN, HLD, paroxysmal supraventricular tachycardia   Clinical Impression   Patient presenting with decreased Ind in self care, balance, functional mobility/transfers, endurance, and safety awareness. Patient reports being independent at baseline and living at home with wife. Pt currently requiring min A of 2 to stand and for side steps to recliner chair. +2 also needed for lines and leads this session.Patient will benefit from acute OT to increase overall independence in the areas of ADLs, functional mobility, and safety awareness in order to safely discharge home with wife.      Recommendations for follow up therapy are one component of a multi-disciplinary discharge planning process, led by the attending physician.  Recommendations may be updated based on patient status, additional functional criteria and insurance authorization.   Follow Up Recommendations  Home health OT     Assistance Recommended at Discharge Intermittent Supervision/Assistance  Patient can return home with the following A little help with walking and/or transfers;A little help with bathing/dressing/bathroom;Assistance with cooking/housework;Help with stairs or ramp for entrance;Assist for transportation    Functional Status Assessment  Patient has had a recent decline in their functional status and  demonstrates the ability to make significant improvements in function in a reasonable and predictable amount of time.  Equipment Recommendations  BSC/3in1;Other (comment) (RW)       Precautions / Restrictions Precautions Precautions: Fall Restrictions Weight Bearing Restrictions: No      Mobility Bed Mobility Overal bed mobility: Needs Assistance Bed Mobility: Supine to Sit     Supine to sit: Supervision, HOB elevated     General bed mobility comments: increased time and effort    Transfers Overall transfer level: Needs assistance Equipment used: 2 person hand held assist Transfers: Sit to/from Stand, Bed to chair/wheelchair/BSC Sit to Stand: Min assist, +2 physical assistance     Step pivot transfers: Min assist, +2 physical assistance            Balance Overall balance assessment: Needs assistance Sitting-balance support: Feet supported, Bilateral upper extremity supported Sitting balance-Leahy Scale: Fair Sitting balance - Comments: supervision static sitting   Standing balance support: Bilateral upper extremity supported, During functional activity Standing balance-Leahy Scale: Poor                             ADL either performed or assessed with clinical judgement   ADL Overall ADL's : Needs assistance/impaired                         Toilet Transfer: Minimal assistance;+2 for safety/equipment;+2 for physical assistance;Rolling walker (2 wheels) Toilet Transfer Details (indicate cue type and reason): simulated                 Vision Baseline Vision/History: 1 Wears glasses Patient Visual Report: No change from baseline  Pertinent Vitals/Pain Pain Assessment Pain Assessment: No/denies pain     Hand Dominance Right   Extremity/Trunk Assessment Upper Extremity Assessment Upper Extremity Assessment: Generalized weakness   Lower Extremity Assessment Lower Extremity Assessment: Generalized weakness        Communication Communication Communication: No difficulties   Cognition Arousal/Alertness: Awake/alert Behavior During Therapy: WFL for tasks assessed/performed Overall Cognitive Status: Within Functional Limits for tasks assessed                                 General Comments: AxOx4, pleasant, follows commands     General Comments  Pt on 4L/min via nasal cannula.            Home Living Family/patient expects to be discharged to:: Private residence Living Arrangements: Spouse/significant other Available Help at Discharge: Family Type of Home: House Home Access: Stairs to enter Technical brewer of Steps: 2 Entrance Stairs-Rails: Right Home Layout: Multi-level Alternate Level Stairs-Number of Steps: main level & basement   Bathroom Shower/Tub: Walk-in shower         Home Equipment: Other (comment)   Additional Comments: walking stick      Prior Functioning/Environment Prior Level of Function : Independent/Modified Independent             Mobility Comments: Independent without AD, can walk up to 3-4 miles/day ADLs Comments: Ind in self care and IADLs. He enjoys cooking.        OT Problem List: Decreased strength;Decreased activity tolerance;Decreased safety awareness;Impaired balance (sitting and/or standing);Decreased knowledge of use of DME or AE      OT Treatment/Interventions: Self-care/ADL training;Therapeutic exercise;Therapeutic activities;Energy conservation;Balance training;Patient/family education    OT Goals(Current goals can be found in the care plan section) Acute Rehab OT Goals Patient Stated Goal: to get stronger and return home OT Goal Formulation: With patient/family Time For Goal Achievement: 06/02/22 Potential to Achieve Goals: Good ADL Goals Pt Will Perform Grooming: with supervision;standing Pt Will Perform Lower Body Dressing: with supervision;sit to/from stand Pt Will Transfer to Toilet: with  supervision;ambulating Pt Will Perform Toileting - Clothing Manipulation and hygiene: with supervision;sit to/from stand  OT Frequency: Min 2X/week    Co-evaluation PT/OT/SLP Co-Evaluation/Treatment: Yes Reason for Co-Treatment: Complexity of the patient's impairments (multi-system involvement);For patient/therapist safety;To address functional/ADL transfers PT goals addressed during session: Mobility/safety with mobility OT goals addressed during session: ADL's and self-care      AM-PAC OT "6 Clicks" Daily Activity     Outcome Measure Help from another person eating meals?: None Help from another person taking care of personal grooming?: A Little Help from another person toileting, which includes using toliet, bedpan, or urinal?: A Little Help from another person bathing (including washing, rinsing, drying)?: A Little Help from another person to put on and taking off regular upper body clothing?: A Little Help from another person to put on and taking off regular lower body clothing?: A Lot 6 Click Score: 18   End of Session Nurse Communication: Mobility status  Activity Tolerance: Patient tolerated treatment well Patient left: in bed;with call bell/phone within reach;with bed alarm set  OT Visit Diagnosis: Unsteadiness on feet (R26.81);Repeated falls (R29.6);Muscle weakness (generalized) (M62.81)                Time: 1497-0263 OT Time Calculation (min): 17 min Charges:  OT General Charges $OT Visit: 1 Visit OT Evaluation $OT Eval Moderate Complexity: 1 Mod Brenyn Petrey, MS, OTR/L , CBIS ascom  [phone]  05/19/22, 4:27 PM

## 2022-05-19 NOTE — Procedures (Signed)
Extubation Procedure Note  Patient Details:   Name: Bobby Moreno DOB: [birth year] MRN: [id number]   Airway Documentation:    Vent end date: 05/19/22 Vent end time: 1205   Evaluation  O2 sats: stable throughout Complications: No apparent complications Patient did tolerate procedure well. Bilateral Breath Sounds: Clear, Diminished   Yes  Pt was extubated to a 4L [place]. Cuff leak was heard. RN and wife at the bedside during extubation. Pt tolerated well.  Tiburcio Bash 05/19/2022, 12:13 PM

## 2022-05-19 NOTE — Consult Note (Addendum)
Bobby Moreno NOTE       Patient ID: [name] MRN: [id number] DOB/AGE: Oct 18, 1945 77 y.o.  Admit date: 05/17/2022 Referring Physician Dr. Mortimer Fries Primary Physician Mcneil Sober ([place]) Primary Cardiologist  Baltazar Najjar, PA-C / Dr. Chryl Heck ([place])  Reason for Consultation paroxymal SVT   HPI: Bobby Moreno is a 25NTI with a PMH of paroxysmal SVT, normal coronaries by [facility] (08/2021), HTN, HLD, colon ca s/p R hemicolectomy w/ ileocolonic anastomosis who presented to [facility] ED 05/17/2022 with palpitations. EMS found him in SVT (HR 200s), gave 2 rounds of adenosine with conversion to NSR. On arrival to [facility] he was febrile to 100.5 and influenza A positive. While in the ED on 1/10 he had witnessed syncopal event, for which a code BLUE was called, ER MD responded and found the patient awake and following commands, diaphoretic, hypotensive, and tachycardic. Also anemic with maroon stools (Hgb drift from 8.'5mg'$  to 7.'1mg'$  throughout the day. GI consulted, CTA abd/pelvis with active bleeding in the proximal stomach. He was intubated for EGD, which revealed partially obstructing esophageal tumor at the GE junction concerning for malignancy. Currently in the ICU, intubated but off sedation, requiring two vasopressors. Cardiology is consulted for assistance with his arrhythmias - evidence of SVT and atrial flutter on tele.   I reviewed his last outpatient cardiology note from 03/2022 where the patient opted for PRN metoprolol tartrate '50mg'$  for palpitations and tachycardia. He politely declined referral to EP at that visit. He has a history of an abnormal stress echo in 07/2021 for which LHC was performed.  There was no significant CAD, normal hemodynamics.  The stress echo was felt to be a false positive study.  He presents with his wife who provides the entirety of the history as the patient is intubated (off sedation, follows commands and nods/shakes head to communicate).  His wife states the patient was very weak and felt ill for a couple days prior to presentation. He had intentions of either going to his doctor or going to the ER because he felt so poorly earlier in the day.  His wife said that he felt lightheaded and collapsed while in their home, his wife lowered him to the floor and then called EMS.  They recall the history above, he is currently intubated but off sedation entirely, requiring vasopressin and phenylephrine for blood pressure support.  He was intubated overnight prior to EGD which was performed on the evening of 1/10.  Per nursing reporting, extubation is planned for today and will plan for upper endoscopy and repeat biopsy tomorrow morning per GI.  On telemetry, he has had periods of narrow complex regular tachycardia with rates as high as the 160s, since and the ICU overnight there is also evidence of atrial flutter/fibrillation with RVR with rates in the 130s-160s.  He has primarily been in sinus rhythm to sinus tachycardia, rate controlled in the 90s-low 100s this morning.  At my time of evaluation he shakes his head no when asked if he has any chest pain or palpitations.  When provided a clipboard and pen, he requests water and says he is hot.  Vitals are notable for a blood pressure of 128/72 while on vasopressin and phenylephrine, he is in sinus rhythm with rate in the 90s on telemetry.  Currently on minimal vent settings and off sedation entirely in anticipation of extubation later this morning.  Labs notable for a potassium of 4.7, magnesium 2.4 after repletion, BUN/creatinine 45/0.97 and GFR >  60. hypocalcemic with potassium 7.5.  BNP barely elevated at 100.2, worsening lactic acidosis with trend from overnight 6.5, 2.8, 2.8, 5.5 this morning.  H&H with drift over the past 6 hours from 8.6-7.8.  Review of systems complete and found to be negative unless listed above     Past Medical History:  Diagnosis Date   HLD (hyperlipidemia)    HTN  (hypertension)    PSVT (paroxysmal supraventricular tachycardia)     Past Surgical History:  Procedure Laterality Date   CATARACT EXTRACTION, BILATERAL  2010    Medications Prior to Admission  Medication Sig Dispense Refill Last Dose   metoprolol succinate (TOPROL-XL) 50 MG 24 hr tablet Take 50 mg by mouth daily.   05/16/2022   pravastatin (PRAVACHOL) 40 MG tablet Take 40 mg by mouth at bedtime.   05/16/2022   nitroGLYCERIN (NITROSTAT) 0.4 MG SL tablet Place 0.4 mg under the tongue every 5 (five) minutes as needed for chest pain (May take up to 3 doses.).   Unknown at prn   Social History   Socioeconomic History   Marital status: Married    Spouse name: Not on file   Number of children: Not on file   Years of education: Not on file   Highest education level: Not on file  Occupational History   Not on file  Tobacco Use   Smoking status: Former   Smokeless tobacco: Never  Substance and Sexual Activity   Alcohol use: Yes    Alcohol/week: 7.0 standard drinks of alcohol    Types: 7 Cans of beer per week   Drug use: Never   Sexual activity: Not Currently    Partners: Female  Other Topics Concern   Not on file  Social History Narrative   Not on file   Social Determinants of Health   Financial Resource Strain: Not on file  Food Insecurity: No Food Insecurity (05/18/2022)   Hunger Vital Sign    Worried About Running Out of Food in the Last Year: Never true    Ran Out of Food in the Last Year: Never true  Transportation Needs: No Transportation Needs (05/18/2022)   PRAPARE - Hydrologist (Medical): No    Lack of Transportation (Non-Medical): No  Physical Activity: Not on file  Stress: Not on file  Social Connections: Not on file  Intimate Partner Violence: Not At Risk (05/18/2022)   Humiliation, Afraid, Rape, and Kick questionnaire    Fear of Current or Ex-Partner: No    Emotionally Abused: No    Physically Abused: No    Sexually Abused: No     Family History  Problem Relation Age of Onset   Hypertension Mother    Dementia Mother    Prostate cancer Father    Emphysema Father       Intake/Output Summary (Last 24 hours) at 05/19/2022 1018 Last data filed at 05/19/2022 1000 Gross per 24 hour  Intake 5706.48 ml  Output 860 ml  Net 4846.48 ml    Vitals:   05/19/22 0742 05/19/22 0800 05/19/22 0900 05/19/22 1000  BP: 95/80 98/72 (!) 109/53   Pulse:    76  Resp:  '12 13 11  '$ Temp:      TempSrc:      SpO2: 97% 94%  93%  Weight:      Height:        PHYSICAL EXAM General: Elderly and ill-appearing Caucasian male, well nourished, in no acute distress.  Sitting at incline  in ICU bed, intubated but off sedation entirely. HEENT:  Normocephalic and atraumatic. Neck:  No JVD.  Lungs: Breathing over vent.  Decreased breath sounds without appreciable crackles or wheezes.   Heart: HRRR . Normal S1 and S2 without gallops or murmurs.  Abdomen: Non-distended appearing.  Msk: Normal strength and tone for age. Extremities: Feet are cool to the touch without peripheral edema.   Neuro: Alert and oriented X 3.  Able to shake and nod his head in response to questions.  Safety mitts on both hands. Psych:  Answers questions appropriately.   Labs: Basic Metabolic Panel: Recent Labs    05/18/22 2036 05/18/22 2037 05/19/22 0458  NA  --  135 138  K  --  4.2 4.7  CL  --  107 109  CO2  --  14* 22  GLUCOSE  --  175* 185*  BUN  --  41* 45*  CREATININE  --  0.82 0.97  CALCIUM  --  6.9* 7.5*  MG 1.7  --  2.4  PHOS 3.0  --  4.6   Liver Function Tests: Recent Labs    05/17/22 1847 05/19/22 0458  AST 37 34  ALT 23 19  ALKPHOS 57 34*  BILITOT 0.4 0.3  PROT 6.3* 4.2*  ALBUMIN 3.1* 2.1*   No results for input(s): "LIPASE", "AMYLASE" in the last 72 hours. CBC: Recent Labs    05/17/22 1847 05/18/22 0405 05/18/22 2037 05/19/22 0107 05/19/22 0458  WBC 8.9   < > 7.7  --  11.5*  NEUTROABS 7.3  --   --   --   --   HGB 9.8*   < >  7.4* 8.5* 8.6*  HCT 30.8*   < > 22.8* 25.0* 26.1*  MCV 96.3   < > 88.4  --  88.2  PLT 255   < > 191  --  215   < > = values in this interval not displayed.   Cardiac Enzymes: Recent Labs    05/17/22 2131 05/17/22 2250 05/18/22 0405  TROPONINIHS 73* 72* 52*   BNP: Recent Labs    05/17/22 1847 05/18/22 2036  BNP 60.7 100.2*   D-Dimer: No results for input(s): "DDIMER" in the last 72 hours. Hemoglobin A1C: No results for input(s): "HGBA1C" in the last 72 hours. Fasting Lipid Panel: Recent Labs    05/19/22 0500  TRIG 138   Thyroid Function Tests: No results for input(s): "TSH", "T4TOTAL", "T3FREE", "THYROIDAB" in the last 72 hours.  Invalid input(s): "FREET3" Anemia Panel: Recent Labs    05/18/22 0405 05/18/22 2037  VITAMINB12  --  1,156*  FOLATE 14.5  --      Radiology: [facility] Chest Port 1 View  Result Date: 05/18/2022 CLINICAL DATA:  Status post central line placement. EXAM: PORTABLE CHEST 1 VIEW COMPARISON:  May 17, 2022 FINDINGS: Endotracheal tube is seen with its distal tip approximately 5.5 cm from the carina. A left internal jugular venous catheter is noted with its distal tip near the junction of the superior vena cava and right atrium. The heart size and mediastinal contours are within normal limits. Mild bilateral infrahilar atelectasis is noted. There is no evidence of a pleural effusion or pneumothorax. The visualized skeletal structures are unremarkable. IMPRESSION: 1. Endotracheal tube and left internal jugular venous catheter positioning, as described above. 2. Mild bilateral infrahilar atelectasis. Electronically Signed   By: Virgina Norfolk M.D.   On: 05/18/2022 21:55   CT ANGIO GI BLEED  Result Date: 05/18/2022 CLINICAL DATA:  GI bleeding. EXAM: CTA ABDOMEN AND PELVIS WITHOUT AND WITH CONTRAST TECHNIQUE: Multidetector CT imaging of the abdomen and pelvis was performed using the standard protocol during bolus administration of intravenous contrast.  Multiplanar reconstructed images and MIPs were obtained and reviewed to evaluate the vascular anatomy. RADIATION DOSE REDUCTION: This exam was performed according to the departmental dose-optimization program which includes automated exposure control, adjustment of the mA and/or kV according to patient size and/or use of iterative reconstruction technique. CONTRAST:  151m OMNIPAQUE IOHEXOL 350 MG/ML SOLN COMPARISON:  CT abdomen and pelvis 07/10/2020 FINDINGS: VASCULAR Aorta: Normal caliber aorta without aneurysm, dissection, vasculitis or significant stenosis. Are atherosclerotic calcifications of the aorta. Celiac: Patent without evidence of aneurysm, dissection, vasculitis or significant stenosis. SMA: Patent without evidence of aneurysm, dissection, vasculitis or significant stenosis. There is mild noncalcified atherosclerotic disease in the proximal SMA. Renals: Both renal arteries are patent without evidence of aneurysm, dissection, vasculitis, fibromuscular dysplasia or significant stenosis. Accessory left renal artery present. IMA: Patent without evidence of aneurysm, dissection, vasculitis or significant stenosis. Inflow: Patent without evidence of aneurysm, dissection, vasculitis or significant stenosis. Proximal Outflow: Not well opacified secondary to timing of the contrast bolus. Veins: No obvious venous abnormality within the limitations of this arterial phase study. Review of the MIP images confirms the above findings. NON-VASCULAR Lower chest: There is peribronchial wall thickening and plugging in the right lower lobe with a small amount of airspace disease. Hepatobiliary: There is a 4.8 x 4.1 cm cyst in the inferior right liver. There is an indeterminate rim enhancing lesion in the left lobe of the liver measuring 15 mm seen on image 17/24. There is a second similar appearing lesion in the left lobe of the liver measuring 12 mm image 17/30. Gallbladder and bile ducts are within normal limits.  Pancreas: Unremarkable. No pancreatic ductal dilatation or surrounding inflammatory changes. Spleen: Normal in size without focal abnormality. Adrenals/Urinary Tract: Are 2 left renal calculi measuring up 2 9 mm. There is left renal cortical scarring. There is no hydronephrosis in either kidney. There are scattered subcentimeter cortical cysts in both kidneys. The adrenal glands and bladder are within normal limits. Stomach/Bowel: The stomach is moderately distended. There is an area of active bleeding seen within the proximal stomach near the gastroesophageal junction. Appendix appears normal. No evidence of bowel wall thickening, distention, or inflammatory changes. The appendix is not visualized. Lymphatic: No enlarged lymph nodes are seen. Reproductive: Prostate gland is enlarged. Right-sided hydrocele present. Other: No abdominal wall hernia or abnormality. No abdominopelvic ascites. Musculoskeletal: There is moderate compression deformity of L2 which appears unchanged. IMPRESSION: 1. Active bleeding in the proximal stomach near the gastroesophageal junction. Aortic Atherosclerosis (ICD10-I70.0). NON-VASCULAR 1. Moderate distention of the stomach. 2. New indeterminate rim enhancing lesions in the left lobe of the liver. Recommend further evaluation with MRI. 3. Nonobstructing left renal calculi. 4. Prostatomegaly. 5. Right-sided hydrocele. These results were called by telephone at the time of interpretation on 05/18/2022 at 8:30 pm to provider BRITTON RUST-CHESTER , who verbally acknowledged these results. Electronically Signed   By: ARonney AstersM.D.   On: 05/18/2022 20:33   ECHOCARDIOGRAM COMPLETE  Result Date: 05/18/2022    ECHOCARDIOGRAM REPORT   Patient Name:   GLANDYN BUCKALEWDate of Exam: 05/18/2022 Medical Rec #:  [id number]  Height:       69.0 in Accession #:    [id number] Weight:       175.0 lb Date of Birth:  3May 14, 1947  BSA:          1.952 m Patient Age:    22 years    BP:           121/76 mmHg  Patient Gender: M           HR:           101 bpm. Exam Location:  [facility] Procedure: 2D Echo, Cardiac Doppler and Color Doppler Indications:     Syncope R55                  Elevated troponin  History:         Patient has no prior history of Echocardiogram examinations.                  Risk Factors:Hypertension and Dyslipidemia. PSVT.  Sonographer:     Sherrie Sport Referring Phys:  [id number] AMY N COX Diagnosing Phys: Kate Sable MD  Sonographer Comments: Suboptimal apical window and no subcostal window. IMPRESSIONS  1. Left ventricular ejection fraction, by estimation, is 65 to 70%. The left ventricle has normal function. The left ventricle has no regional wall motion abnormalities. There is mild left ventricular hypertrophy. Left ventricular diastolic parameters are consistent with Grade I diastolic dysfunction (impaired relaxation).  2. Right ventricular systolic function is normal. The right ventricular size is normal.  3. The mitral valve is normal in structure. No evidence of mitral valve regurgitation.  4. The aortic valve is tricuspid. Aortic valve regurgitation is mild. Aortic valve sclerosis/calcification is present, without any evidence of aortic stenosis.  5. Aortic dilatation noted. There is mild dilatation of the ascending aorta, measuring 42 mm. There is mild dilatation of the aortic root, measuring 39 mm. FINDINGS  Left Ventricle: Left ventricular ejection fraction, by estimation, is 65 to 70%. The left ventricle has normal function. The left ventricle has no regional wall motion abnormalities. The left ventricular internal cavity size was normal in size. There is  mild left ventricular hypertrophy. Left ventricular diastolic parameters are consistent with Grade I diastolic dysfunction (impaired relaxation). Right Ventricle: The right ventricular size is normal. No increase in right ventricular wall thickness. Right ventricular systolic function is normal. Left Atrium: Left atrial size was normal in  size. Right Atrium: Right atrial size was normal in size. Pericardium: There is no evidence of pericardial effusion. Mitral Valve: The mitral valve is normal in structure. No evidence of mitral valve regurgitation. Tricuspid Valve: The tricuspid valve is normal in structure. Tricuspid valve regurgitation is trivial. Aortic Valve: The aortic valve is tricuspid. Aortic valve regurgitation is mild. Aortic valve sclerosis/calcification is present, without any evidence of aortic stenosis. Aortic valve mean gradient measures 3.0 mmHg. Aortic valve peak gradient measures 4.9 mmHg. Aortic valve area, by VTI measures 4.43 cm. Pulmonic Valve: The pulmonic valve was not well visualized. Pulmonic valve regurgitation is not visualized. Aorta: Aortic dilatation noted. There is mild dilatation of the ascending aorta, measuring 42 mm. There is mild dilatation of the aortic root, measuring 39 mm. Venous: The inferior vena cava was not well visualized. IAS/Shunts: No atrial level shunt detected by color flow Doppler.  LEFT VENTRICLE PLAX 2D LVIDd:         3.80 cm   Diastology LVIDs:         2.60 cm   LV e' medial:    9.90 cm/s LV PW:         1.20 cm   LV E/e' medial:  7.0 LV IVS:  1.20 cm   LV e' lateral:   10.60 cm/s LVOT diam:     2.20 cm   LV E/e' lateral: 6.6 LV SV:         67 LV SV Index:   34 LVOT Area:     3.80 cm  RIGHT VENTRICLE RV Basal diam:  2.70 cm RV Mid diam:    2.40 cm RV S prime:     18.00 cm/s TAPSE (M-mode): 2.0 cm LEFT ATRIUM             Index        RIGHT ATRIUM           Index LA diam:        3.40 cm 1.74 cm/m   RA Area:     16.20 cm LA Vol (A2C):   51.2 ml 26.23 ml/m  RA Volume:   41.20 ml  21.11 ml/m LA Vol (A4C):   36.9 ml 18.90 ml/m LA Biplane Vol: 46.6 ml 23.87 ml/m  AORTIC VALVE AV Area (Vmax):    4.08 cm AV Area (Vmean):   4.04 cm AV Area (VTI):     4.43 cm AV Vmax:           111.00 cm/s AV Vmean:          77.000 cm/s AV VTI:            0.152 m AV Peak Grad:      4.9 mmHg AV Mean Grad:       3.0 mmHg LVOT Vmax:         119.00 cm/s LVOT Vmean:        81.800 cm/s LVOT VTI:          0.177 m LVOT/AV VTI ratio: 1.16  AORTA Ao Root diam: 3.94 cm MITRAL VALVE                TRICUSPID VALVE MV Area (PHT): 4.71 cm     TR Peak grad:   12.4 mmHg MV Decel Time: 161 msec     TR Vmax:        176.00 cm/s MV E velocity: 69.70 cm/s MV A velocity: 110.00 cm/s  SHUNTS MV E/A ratio:  0.63         Systemic VTI:  0.18 m                             Systemic Diam: 2.20 cm Kate Sable MD Electronically signed by Kate Sable MD Signature Date/Time: 05/18/2022/2:48:12 PM    Final    DG Chest Port 1 View  Result Date: 05/17/2022 CLINICAL DATA:  Syncope EXAM: PORTABLE CHEST 1 VIEW COMPARISON:  None Available. FINDINGS: The heart size and mediastinal contours are within normal limits. Both lungs are clear. The visualized skeletal structures are unremarkable. IMPRESSION: No active disease. Electronically Signed   By: Ronney Asters M.D.   On: 05/17/2022 22:46    LHC  Cardiac catheterization April 2023: Impressions: 1. The coronaries are a left dominant system. There is no significant CAD. 2. Normal hemodynamics 3. Right radial sheath removed in lab, TR band placed   ECHO 05/18/2022  1. Left ventricular ejection fraction, by estimation, is 65 to 70%. The  left ventricle has normal function. The left ventricle has no regional  wall motion abnormalities. There is mild left ventricular hypertrophy.  Left ventricular diastolic parameters  are consistent with Grade I diastolic dysfunction (impaired relaxation).  2. Right ventricular systolic function is normal. The right ventricular  size is normal.   3. The mitral valve is normal in structure. No evidence of mitral valve  regurgitation.   4. The aortic valve is tricuspid. Aortic valve regurgitation is mild.  Aortic valve sclerosis/calcification is present, without any evidence of  aortic stenosis.   5. Aortic dilatation noted. There is mild dilatation  of the ascending  aorta, measuring 42 mm. There is mild dilatation of the aortic root,  measuring 39 mm.   TELEMETRY reviewed by me (LT) 05/19/2022 : currently NSR rate 90s. 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 EMS found him in SVT (HR 200s), gave 2 rounds of adenosine with conversion to NSR. On arrival to [facility] he was febrile to 100.5 and influenza A positive. While in the ED on 1/10 he had witnessed syncopal event, for which a code BLUE was called, ER MD responded and found the patient awake and following commands, diaphoretic, hypotensive, and tachycardic. Also anemic with maroon stools (Hgb drift from 8.'5mg'$  to 7.'1mg'$  throughout the day. GI consulted, CTA abd/pelvis with active bleeding in the proximal stomach. He was intubated for EGD, which revealed partially obstructing esophageal tumor at the GE junction concerning for malignancy. Currently in the ICU, intubated but off sedation, requiring two vasopressors. Cardiology is consulted for assistance with his arrhythmias - evidence of SVT and atrial flutter on tele.   # near syncope # melena  # symptomatic anemia 2/2 bleeding esophageal  tumor  -Ongoing workup with GI, they are planning for CT/repeat upper endoscopy tomorrow morning if the patient is stable.  Hgb continues to drift today from 8.6, 7.8.  # influenza A  Supportive care, management per primary team  # paroxysmal SVT  # new atrial flutter RVR  Compensatory for acute blood loss anemia and influenza as above, exacerbated by vasopressors.  Currently in sinus rhythm, but overnight on 1/10 he was tachycardic with rates as high as 160s and what appears to be new onset atrial flutter/fib.  He has a CHA2DS2-VASc score of 2 (age, HTN) but anticoagulation is contraindicated while there is active bleeding + ongoing workup of his esophageal tumor, likely requiring further biopsies. -Wean vasopressor support as tolerated -Monitor and replete electrolytes for a K >4, mag >2 -Once off vasopressors, add metoprolol tartrate 12.5 mg twice daily, and uptitrate as needed -Echo complete resulted with preserved LVEF, g1 diastolic dysfunction, aortic valve sclerosis without stenosis or other valve insufficiencies. Mild dilation of aortic root at 71m.  -No further cardiac diagnostics necessary.  # HFpEF Euvolemic to dry on exam. BNP  100.2.   #demand ischemia  Minimal elevation in trends 33, 73, 72, 52 in the setting of acute blood loss anemia and tachycardia this is most consistent with demand/supply mismatch and not ACS   This patient's plan of care was discussed and created with Dr. Clayborn Bigness and he is in agreement.  Signed: Tristan Schroeder , PA-C 05/19/2022, 10:18 AM [facility] Cardiology

## 2022-05-20 ENCOUNTER — Inpatient Hospital Stay: Payer: Medicare HMO | Admitting: Anesthesiology

## 2022-05-20 ENCOUNTER — Encounter
Admission: EM | Disposition: A | Discharge status: Home / Self Care | Payer: Self-pay | Source: Home / Self Care | Attending: Student

## 2022-05-20 DIAGNOSIS — K2289 Other specified disease of esophagus: Secondary | ICD-10-CM | POA: Diagnosis not present

## 2022-05-20 HISTORY — PX: ESOPHAGOGASTRODUODENOSCOPY (EGD) WITH PROPOFOL: SHX5813

## 2022-05-20 LAB — TYPE AND SCREEN
ABO/RH(D): O POS
Antibody Screen: NEGATIVE
Unit division: 0
Unit division: 0
Unit division: 0
Unit division: 0
Unit division: 0
Unit division: 0

## 2022-05-20 LAB — BPAM RBC
Blood Product Expiration Date: 202402132359
Blood Product Expiration Date: 202402132359
Blood Product Expiration Date: 202402162359
Blood Product Expiration Date: 202402162359
Blood Product Expiration Date: 202402182359
Blood Product Expiration Date: 202402182359
ISSUE DATE / TIME: 202401101506
ISSUE DATE / TIME: 202401101702
ISSUE DATE / TIME: 202401102135
ISSUE DATE / TIME: 202401111352
ISSUE DATE / TIME: 202401111951
ISSUE DATE / TIME: 202401112223
Unit Type and Rh: 5100
Unit Type and Rh: 5100
Unit Type and Rh: 5100
Unit Type and Rh: 5100
Unit Type and Rh: 5100
Unit Type and Rh: 5100

## 2022-05-20 LAB — CBC WITH DIFFERENTIAL/PLATELET
Abs Immature Granulocytes: 0.01 10*3/uL (ref 0.00–0.07)
Basophils Absolute: 0 10*3/uL (ref 0.0–0.1)
Basophils Relative: 0 %
Eosinophils Absolute: 0 10*3/uL (ref 0.0–0.5)
Eosinophils Relative: 0 %
HCT: 26 % — ABNORMAL LOW (ref 39.0–52.0)
Hemoglobin: 8.6 g/dL — ABNORMAL LOW (ref 13.0–17.0)
Immature Granulocytes: 0 %
Lymphocytes Relative: 20 %
Lymphs Abs: 0.9 10*3/uL (ref 0.7–4.0)
MCH: 29.6 pg (ref 26.0–34.0)
MCHC: 33.1 g/dL (ref 30.0–36.0)
MCV: 89.3 fL (ref 80.0–100.0)
Monocytes Absolute: 0.2 10*3/uL (ref 0.1–1.0)
Monocytes Relative: 4 %
Neutro Abs: 3.5 10*3/uL (ref 1.7–7.7)
Neutrophils Relative %: 76 %
Platelets: 102 10*3/uL — ABNORMAL LOW (ref 150–400)
RBC: 2.91 MIL/uL — ABNORMAL LOW (ref 4.22–5.81)
RDW: 15.1 % (ref 11.5–15.5)
WBC: 4.6 10*3/uL (ref 4.0–10.5)
nRBC: 0 % (ref 0.0–0.2)

## 2022-05-20 LAB — GLUCOSE, CAPILLARY
Glucose-Capillary: 103 mg/dL — ABNORMAL HIGH (ref 70–99)
Glucose-Capillary: 105 mg/dL — ABNORMAL HIGH (ref 70–99)
Glucose-Capillary: 93 mg/dL (ref 70–99)
Glucose-Capillary: 98 mg/dL (ref 70–99)
Glucose-Capillary: 99 mg/dL (ref 70–99)
Glucose-Capillary: 99 mg/dL (ref 70–99)

## 2022-05-20 LAB — LACTIC ACID, PLASMA: Lactic Acid, Venous: 1.4 mmol/L (ref 0.5–1.9)

## 2022-05-20 LAB — HEMOGLOBIN AND HEMATOCRIT, BLOOD
HCT: 25.5 % — ABNORMAL LOW (ref 39.0–52.0)
HCT: 27.3 % — ABNORMAL LOW (ref 39.0–52.0)
HCT: 26.1 % — ABNORMAL LOW (ref 39.0–52.0)
Hemoglobin: 8.7 g/dL — ABNORMAL LOW (ref 13.0–17.0)
Hemoglobin: 9.4 g/dL — ABNORMAL LOW (ref 13.0–17.0)
Hemoglobin: 8.8 g/dL — ABNORMAL LOW (ref 13.0–17.0)

## 2022-05-20 LAB — CELIAC DISEASE PANEL
Endomysial Ab, IgA: NEGATIVE
IgA: 213 mg/dL (ref 61–437)
Tissue Transglutaminase Ab, IgA: <2 U/mL (ref 0–3)

## 2022-05-20 LAB — BASIC METABOLIC PANEL
Anion gap: 6 (ref 5–15)
BUN: 31 mg/dL — ABNORMAL HIGH (ref 8–23)
CO2: 23 mmol/L (ref 22–32)
Calcium: 7.3 mg/dL — ABNORMAL LOW (ref 8.9–10.3)
Chloride: 110 mmol/L (ref 98–111)
Creatinine, Ser: 0.71 mg/dL (ref 0.61–1.24)
GFR, Estimated: >60 mL/min (ref >60)
Glucose, Bld: 105 mg/dL — ABNORMAL HIGH (ref 70–99)
Potassium: 4 mmol/L (ref 3.5–5.1)
Sodium: 139 mmol/L (ref 135–145)

## 2022-05-20 LAB — PHOSPHORUS: Phosphorus: 2.5 mg/dL (ref 2.5–4.6)

## 2022-05-20 LAB — MAGNESIUM: Magnesium: 2.1 mg/dL (ref 1.7–2.4)

## 2022-05-20 SURGERY — ESOPHAGOGASTRODUODENOSCOPY (EGD) WITH PROPOFOL
Anesthesia: General

## 2022-05-20 MED ORDER — SODIUM CHLORIDE 0.9 % IV SOLN
3.0000 g | Freq: Four times a day (QID) | INTRAVENOUS | Status: DC
  Administered 2022-05-20 – 2022-05-24 (×15): 3 g via INTRAVENOUS
  Filled 2022-05-20 (×2): qty 3
  Filled 2022-05-20 (×6): qty 8
  Filled 2022-05-20: qty 3
  Filled 2022-05-20 (×5): qty 8
  Filled 2022-05-20: qty 3
  Filled 2022-05-20: qty 8
  Filled 2022-05-20: qty 3

## 2022-05-20 MED ORDER — SODIUM CHLORIDE 0.9% FLUSH
10.0000 mL | Freq: Two times a day (BID) | INTRAVENOUS | Status: DC
  Administered 2022-05-20 – 2022-05-24 (×6): 10 mL

## 2022-05-20 MED ORDER — SODIUM CHLORIDE 0.9% FLUSH: 10.0000 mL | INTRAVENOUS | Status: DC | PRN

## 2022-05-20 MED ORDER — METOPROLOL TARTRATE 25 MG PO TABS
12.5000 mg | ORAL_TABLET | Freq: Two times a day (BID) | ORAL | Status: DC
  Administered 2022-05-20 – 2022-05-23 (×6): 12.5 mg via ORAL
  Filled 2022-05-20 (×6): qty 1

## 2022-05-20 MED ORDER — PROPOFOL 10 MG/ML IV BOLUS
INTRAVENOUS | Status: DC | PRN
  Administered 2022-05-20 (×2): 20 mg via INTRAVENOUS
  Administered 2022-05-20: 80 mg via INTRAVENOUS

## 2022-05-20 MED ORDER — ADULT MULTIVITAMIN W/MINERALS CH
1.0000 | ORAL_TABLET | Freq: Every day | ORAL | Status: DC
  Administered 2022-05-20 – 2022-05-24 (×5): 1 via ORAL
  Filled 2022-05-20 (×5): qty 1

## 2022-05-20 MED ORDER — BOOST / RESOURCE BREEZE PO LIQD CUSTOM
1.0000 | Freq: Three times a day (TID) | ORAL | Status: DC
  Administered 2022-05-20 – 2022-05-23 (×5): 1 via ORAL

## 2022-05-20 NOTE — Progress Notes (Signed)
EGD with biopsies of esophageal mass taken today   I will sign off.  Please call me if any further GI concerns or questions.  We would like to thank you for the opportunity to participate in the care of Bobby Moreno.    Dr [name] MD,MRCP [facility]) Gastroenterology/Hepatology Pager: [phone]

## 2022-05-20 NOTE — Progress Notes (Signed)
NAME:  Bobby Moreno, MRN:  [id number], DOB:  January 03, 1946, LOS: 3 ADMISSION DATE:  05/17/2022, CONSULTATION DATE: 05/18/22 REFERRING MD: Dr. Sheppard Coil, CHIEF COMPLAINT: GI Bleed    History of Present Illness:  This is a 77 yo male with a hx of Paroxysmal SVT, HTN, and Anemia.  He presented to [facility] ER via EMS with palpitations.  Upon EMS arrival at pts home he was found to be in SVT and hypotensive initial bp 70/40.  Attempted vagal maneuvers without improvement. He received 6 mg of adenosine followed by 12 mg of adenosine and converted to normal sinus rhythm.  He denies taking blood thinners, aspirin, or ibuprofen.  He does endorse drinking daily 2 beers and a glass of liquor.  His last alcoholic beverage was 2 weeks ago   ED Course  Upon arrival to the ER pt tachycardic.  EKG revealed sinus tachycardia, heart rate 127, no evidence of ischemia.  Pt incidentally found to be influenza positive.  Pt initially admitted to the progressive care unit per hospitalist team for additional workup and treatment, however he remained in the ER pending bed availability.  See detailed hospital course under significant events.   Initial vital signs: Temp 100.5, bp 122/76, hr 126, O2 sats 93% on RA  Significant labs: glucose 115/BUN 31/calcium 7.4/troponin 52/hgb 8.5 CXR: No active disease   Pertinent  Medical History  HTN HLD PSVT   Significant Hospital Events: Including procedures, antibiotic start and stop dates in addition to other pertinent events   01/9: Pt admitted to the progressive care unit with SVT and influenza A, however remained in the ER pending bed availability.  While in the ER pt had a syncopal episode x2  01/10: GI consulted for worsening anemia, syncope.  Pt had another witnessed syncopal episode with brief episode of unresponsive and became pale/diaphoretic/hypotensive Code Blue initiated.  Noted to have acute GI bleed with maroon colored stools.  GI aware recommended CT Angiogram 01/10:  CTA>>Active bleeding in the proximal stomach near the gastroesophageal       junction. Aortic Atherosclerosis (ICD10-I70.0). Moderate distention of the stomach.       New indeterminate rim enhancing lesions in the left lobe of the liver. Recommend        further evaluation with MRI. Nonobstructing left renal calculi. Prostatomegaly.        Right-sided hydrocele. Pt mechanically intubated for EGD.  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 Hemostatic  spray applied. Injected. Red        blood in the gastric fundus. Normal gastric body and antrum. Normal duodenal        bulb and second portion of the duodenum. Will need to repeat EGD for tissue             diagnosis once medically stable  01/11: Pt remains mechanically intubated on minimal ventilator settings.  Requst        GI repeat EGD for tissue diagnosis today while mechanically intubated.  Post        procedure will plan for WUA and SBT with possible extubation  05/20/22- patient stable for TRH transfer.  Appreciate Dr Sheppard Coil.   Interim History / Subjective:  As outlined above   Objective   Blood pressure 128/65, pulse 98, temperature 97.7 F (36.5 C), temperature source Temporal, resp. rate (!) 27, height '5\' 9"'$  (1.753 m), weight 78.8 kg, SpO2 97 %.  Intake/Output Summary (Last 24 hours) at 05/20/2022 1737 Last data filed at 05/20/2022 1733 Gross per 24 hour  Intake 3199.73 ml  Output 1700 ml  Net 1499.73 ml    Filed Weights   05/17/22 1836 05/18/22 1700 05/20/22 0500  Weight: 79.4 kg 79.2 kg 78.8 kg    Examination: General: Acutely-ill pale appearing elderly male, NAD mechanically intubated  HENT: Supple, no JVD Lungs: Clear throughout, even, non labored  Cardiovascular: Sinus tachycardia, no m/r/g, 2+ radial/2+ distal pulses, no edema  Abdomen: +BS x4, soft, non tender, non distended  Extremities: Normal bulk and tone, moves all  extremities  Neuro: Sedated, following commands during WUA, PERRL GU: Indwelling foley catheter draining clear yellow urine   Resolved Hospital Problem list     Assessment & Plan:  Pre-Syncope secondary to ABLA in the setting of Acute Gastrointestinal Bleed secondary to large fungating submucosal ulcerating mass s/p EGD with injection of epinephrine  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 Per last cardiology note patient was not interested in previously offered ablation. Echo 05/18/22: LVEF 65-70%, G1DD, mild dilatation in aorta & aortic root - Continue pravastatin - Hold outpatient regimen due to hypotension and ABLA, consider restarting as patient stabilizes >> metoprolol - Continuous cardiac monitoring  Mechanical intubation for airway protection during EGD  Influenza A infection - Full vent support for now: vent settings reviewed and established  - Continue lung protective strategies  - SBT once all parameters met  - VAP bundle implemented  - Maintain RASS goal 0 to -1 - PAD protocol to maintain RASS goal: Propofol and fentanyl gtts to maintain RASS goal  - WUA daily   Daily ETOH use Patient stated his last drink was 2 weeks ago - Consider CIWA if patient shows signs of DT's, but he should be outside the window at this point - Monitor electrolytes closely, daily BMP, Mg, Phos- replace PRN -  Supportive care  Best Practice (right click and "Reselect all SmartList Selections" daily)  Diet/type: NPO DVT prophylaxis: SCD GI prophylaxis: PPI Lines: Left IJ CVL  Foley:  Yes and still needed  Code Status:  full code Last date of multidisciplinary goals of care discussion [05/19/22]  01/11: Will update pts wife when she arrives at bedside  Labs   CBC: Recent Labs  Lab 05/17/22 1847 05/18/22 0405 05/18/22 1623 05/18/22 2037 05/19/22 0107 05/19/22 0458 05/19/22 1117 05/19/22 1859 05/20/22 0053 05/20/22 0522 05/20/22 1125 05/20/22 1727  WBC 8.9 5.6 7.9 7.7  --  11.5*  --   --   --  4.6  --   --   NEUTROABS 7.3  --   --   --   --   --   --   --   --  3.5  --   --   HGB 9.8* 8.5* 7.1* 7.4*   < > 8.6*   < > 7.1* 8.7* 8.6* 9.4* 8.8*  HCT 30.8* 27.0* 22.8* 22.8*   < > 26.1*   < > 21.3* 25.5* 26.0* 27.3* 26.1*  MCV 96.3 96.8 97.0 88.4  --  88.2  --   --   --  89.3  --   --   PLT 255 222 192 191  --  215  --   --   --  102*  --   --    < > = values in this interval not displayed.     Basic Metabolic Panel: Recent Labs  Lab 05/18/22 0405 05/18/22 1623 05/18/22 2036 05/18/22 2037 05/19/22 0458 05/20/22 0522  NA 135 135  --  135 138 139  K 4.1 4.3  --  4.2 4.7 4.0  CL 105 109  --  107 109 110  CO2 24 20*  --  14* 22 23  GLUCOSE 115* 175*  --  175* 185* 105*  BUN 31* 37*  --  41* 45* 31*  CREATININE 0.70 0.76  --  0.82 0.97 0.71  CALCIUM 7.4* 7.0*  --  6.9* 7.5* 7.3*  MG  --   --  1.7  --  2.4 2.1  PHOS  --   --  3.0  --  4.6 2.5    GFR: Estimated Creatinine Clearance: 81 mL/min (by C-G formula based on SCr of 0.71 mg/dL). Recent Labs  Lab 05/18/22 1623 05/18/22 2036 05/18/22 2037 05/19/22 0107 05/19/22 0458 05/19/22 0500 05/19/22 1117 05/19/22 1615 05/19/22 1859 05/20/22 0522  WBC 7.9  --  7.7  --  11.5*  --   --   --   --  4.6  LATICACIDVEN  --    < >  --    < >  --    < > 5.5* 5.6* 4.4* 1.4   < > = values in this interval not displayed.      Liver Function Tests: Recent Labs  Lab 05/17/22 1847 05/19/22 0458  AST 37 34  ALT 23 19  ALKPHOS 57 34*  BILITOT 0.4 0.3  PROT 6.3* 4.2*  ALBUMIN 3.1* 2.1*    No results for input(s): "LIPASE", "AMYLASE" in the last 168 hours. No results for input(s): "AMMONIA" in the last 168 hours.  ABG    Component Value Date/Time   PHART 7.4 05/18/2022 2135   PCO2ART 31 (L) 05/18/2022 2135   PO2ART 163 (H) 05/18/2022 2135   HCO3 23.6 05/19/2022 2146   ACIDBASEDEF 4.5 (H) 05/18/2022 2135   O2SAT 86.7 05/19/2022 2146     Coagulation Profile: Recent Labs  Lab 05/18/22 2036  INR 1.4*     Cardiac Enzymes: No results for input(s): "CKTOTAL", "CKMB", "CKMBINDEX", "TROPONINI" in the last 168 hours.  HbA1C: No results found for: "HGBA1C"  CBG: Recent Labs  Lab 05/19/22 1944 05/19/22 2314 05/20/22 0310 05/20/22 1132 05/20/22 1546  GLUCAP 138* 124* 105* 98 99     Review of Systems:   Unable to assess pt mechanically intubated   Past Medical History:  He,  has a past medical history of HLD (hyperlipidemia), HTN (hypertension), and PSVT (paroxysmal supraventricular tachycardia).   Surgical History:   Past Surgical History:  Procedure Laterality Date   CATARACT EXTRACTION, BILATERAL  2010   ESOPHAGOGASTRODUODENOSCOPY N/A 05/18/2022   Procedure: ESOPHAGOGASTRODUODENOSCOPY (EGD);  Surgeon: Lin Landsman, MD;  Location: [facility] ENDOSCOPY;  Service: Gastroenterology;  Laterality: N/A;     Social History:   reports that he has quit smoking. He has never used smokeless tobacco. He reports current alcohol  use of about 7.0 standard drinks of alcohol per week. He reports that he does not use drugs.   Family History:  His family history includes Dementia in his mother; Emphysema in his father; Hypertension in his mother; Prostate cancer in his father.   Allergies No Known Allergies   Home Medications  Prior to Admission medications   Medication Sig Start Date End  Date Taking? Authorizing Provider  metoprolol succinate (TOPROL-XL) 50 MG 24 hr tablet Take 50 mg by mouth daily. 04/27/22 04/27/23 Yes [provider]  pravastatin (PRAVACHOL) 40 MG tablet Take 40 mg by mouth at bedtime. 04/27/22  Yes [provider]  nitroGLYCERIN (NITROSTAT) 0.4 MG SL tablet Place 0.4 mg under the tongue every 5 (five) minutes as needed for chest pain (May take up to 3 doses.).    [provider]     Critical care provider statement:   Total critical care time: 33 minutes   Performed by: Lanney Gins MD   Critical care time was exclusive of separately billable procedures and treating other patients.   Critical care was necessary to treat or prevent imminent or life-threatening deterioration.   Critical care was time spent personally by me on the following activities: development of treatment plan with patient and/or surrogate as well as nursing, discussions with consultants, evaluation of patient's response to treatment, examination of patient, obtaining history from patient or surrogate, ordering and performing treatments and interventions, ordering and review of laboratory studies, ordering and review of radiographic studies, pulse oximetry and re-evaluation of patient's condition.    Ottie Glazier, M.D.  Pulmonary & Critical Care Medicine

## 2022-05-20 NOTE — Anesthesia Preprocedure Evaluation (Signed)
Anesthesia Evaluation  Patient identified by MRN, date of birth, ID band Patient awake    Reviewed: Allergy & Precautions, NPO status , Patient's Chart, lab work & pertinent test results  History of Anesthesia Complications Negative for: history of anesthetic complications  Airway Mallampati: II  TM Distance: >3 FB Neck ROM: full    Dental  (+) Poor Dentition   Pulmonary pneumonia (influenza, patient extubated 1/11 currently on 3L [place]), former smoker   Pulmonary exam normal        Cardiovascular Exercise Tolerance: Poor hypertension, On Medications + dysrhythmias Atrial Fibrillation and Supra Ventricular Tachycardia      Neuro/Psych negative neurological ROS  negative psych ROS   GI/Hepatic negative GI ROS, Neg liver ROS,,,Esophageal mass concerning for malignancy  Hx of colon cancer     Endo/Other  negative endocrine ROS    Renal/GU negative Renal ROS  negative genitourinary   Musculoskeletal   Abdominal   Peds  Hematology  (+) Blood dyscrasia, anemia   Anesthesia Other Findings Past Medical History: No date: HLD (hyperlipidemia) No date: HTN (hypertension) No date: PSVT (paroxysmal supraventricular tachycardia)  Past Surgical History: 2010: CATARACT EXTRACTION, BILATERAL 05/18/2022: ESOPHAGOGASTRODUODENOSCOPY; N/A     Comment:  Procedure: ESOPHAGOGASTRODUODENOSCOPY (EGD);  Surgeon:               Vanga, Rohini Reddy, MD;  Location: [facility] ENDOSCOPY;                Service: Gastroenterology;  Laterality: N/A;  BMI    Body Mass Index: 25.65 kg/m      Reproductive/Obstetrics negative OB ROS                              Anesthesia Physical Anesthesia Plan  ASA: 3  Anesthesia Plan: General   Post-op Pain Management: Minimal or no pain anticipated   Induction: Intravenous  PONV Risk Score and Plan: Propofol infusion and TIVA  Airway Management Planned: Natural Airway and  Nasal Cannula  Additional Equipment:   Intra-op Plan:   Post-operative Plan:   Informed Consent: I have reviewed the patients History and Physical, chart, labs and discussed the procedure including the risks, benefits and alternatives for the proposed anesthesia with the patient or authorized representative who has indicated his/her understanding and acceptance.     Dental Advisory Given  Plan Discussed with: Anesthesiologist, CRNA and Surgeon  Anesthesia Plan Comments: (Patient consented for risks of anesthesia including but not limited to:  - adverse reactions to medications - risk of airway placement if required - damage to eyes, teeth, lips or other oral mucosa - nerve damage due to positioning  - sore throat or hoarseness - Damage to heart, brain, nerves, lungs, other parts of body or loss of life  Patient voiced understanding.)         Anesthesia Quick Evaluation  

## 2022-05-20 NOTE — Progress Notes (Signed)
OT Cancellation Note  Patient Details Name: Bobby Moreno MRN: [id number] DOB: Apr 18, 1946   Cancelled Treatment:    Reason Eval/Treat Not Completed: Patient declined, no reason specified. OT attempting to see pt before procedure. Pt declined all therapeutic interventions and politely asking therapist to return after biopsy is completed. OT will re-attempt at next available time.  Darleen Crocker, MS, OTR/L , CBIS ascom [phone]  05/20/22, 10:34 AM

## 2022-05-20 NOTE — Evaluation (Signed)
Clinical/Bedside Swallow Evaluation Patient Details  Name: Bobby Moreno MRN: [id number] Date of Birth: 22-Dec-1945  Today's Date: 05/20/2022 Time: SLP Start Time (ACUTE ONLY): 1455 SLP Stop Time (ACUTE ONLY): 1540 SLP Time Calculation (min) (ACUTE ONLY): 45 min  Past Medical History:  Past Medical History:  Diagnosis Date   HLD (hyperlipidemia)    HTN (hypertension)    PSVT (paroxysmal supraventricular tachycardia)    Past Surgical History:  Past Surgical History:  Procedure Laterality Date   CATARACT EXTRACTION, BILATERAL  2010   ESOPHAGOGASTRODUODENOSCOPY N/A 05/18/2022   Procedure: ESOPHAGOGASTRODUODENOSCOPY (EGD);  Surgeon: Lin Landsman, MD;  Location: [facility] ENDOSCOPY;  Service: Gastroenterology;  Laterality: N/A;   HPI:  Pt is a 77 y.o. male with a history of PSVT, HTN, HLD, IDA, etoh abuse and colon cancer s/p robot-assisted laparoscopic right partial colectomy 08/21/20 who is admitted with esophageal dysphagia, anemia, GIB and flu A.   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     1/12- s/p EGD with biopsies of esophageal mass taken for oncology f/u.  NP discussed possible g-tube placement with pt and wife. Pt still a little overwhelmed by decision, but is leaning toward this and IR consult for g-tube placement has been placed.  CXR at admit: No active disease.    Assessment / Plan / Recommendation  Clinical Impression    Pt seen for BSE today. Wife present. NP present for part of visit for education. Pt A/O x3; engaged verbally and following instructions. Appeared min overwhelmed w/ abundance of information re: the new finding of an Esophageal mass/tumor on the EGD -- see GI notes.   On [place] O2 support- 4L; afebrile, WBC not elevated.    OF NOTE: Pt endorsed Esophageal phase dysmotility moreso w/ solids and Belching in recent weeks; Wife agreed.   Pt appears to present w/  functional/adequate oropharyngeal phase swallowing w/ No neuromuscular nor sensorimotor deficits appreciated. No overt clinical s/s of aspiration noted w/ trials given. Pt appears at reduced risk for aspiration of thin liquids from an oropharyngeal phase standpoint following general aspiration precautions.  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 See GI notes, scan. ANY Dysmotility or Regurgitation of Reflux material can increase risk for aspiration of the Reflux material during Retrograde flow thus impact Voicing and Pulmonary status.  Trials of solids nor increased viscosities were not recommended nor assessed d/t Esophageal phase deficits and dysmotility and risk for Retrograde activity thus risk for aspiration/aspiration pneumonia. This could be further discussed w/ GI if desired.     Pt sat upright in bed and consumed several trials of thin liquids Via Cup w/ No immediate, overt clinical s/s of aspiration noted; clear vocal quality b/t trials, no decline in pulmonary status, no multiple swallows noted post initial pharyngeal swallow, no decline in O2 sats(98%). Oral phase appeared [facility] for bolus management and timely A-P transfer/clearing of material. OM exam was [facility] for oral clearing; lingual/labial movements. No unilateral weakness. Speech clear.  Straws were not recommended d/t increased air swallowing and Esophageal dysmotility. Pt exhibited MOD+ Belching post trials.   Recommend a Clear liquids diet of thin liquids. General aspiration precautions. Rest Breaks during meals/oral intake to allow for Esophageal clearing. REFLUX precautions strongly recommended to lessen chance for Regurgitation -- HOB elevated at night when sleeping.  If any Pills given -- then given CRUSHED in Puree for ease of Esophageal  clearing.   Recommend pt f/u w/ GI for future assessment/management of Esophageal phase Dysmotility and Reflux and tx as  indicated. Discussion both pt and Wife on above and recommendation for alternative means of feeding (PEG) if desired for nutrition/hydration needs moving forward w/ future txs for the tumor/mass. Discuss further w/ his medical Team; NP present.   MD to reconsult ST services if any new needs while admitted. NSG updated. Pt/Wife  appreciative of Education information. SLP Visit Diagnosis: Dysphagia, unspecified (R13.10) (Esophageal phase Dysmotility; suspected tumor/mass in the Esophagus per GI/EGD)    Aspiration Risk   (reduced following general precs from an oropharyngeal phase standpoint)    Diet Recommendation   a Clear liquids diet of thin liquids - no straws d/t air swallowed. General aspiration precautions. Rest Breaks during meals/oral intake to allow for Esophageal clearing. REFLUX precautions strongly recommended to lessen chance for Regurgitation -- HOB elevated at night when sleeping.  Medication Administration: Crushed with puree (for Esophageal clearing)    Other  Recommendations Recommended Consults: Consider GI evaluation;Consider esophageal assessment (ongoing) Oral Care Recommendations: Oral care BID;Oral care before and after PO;Patient independent with oral care Other Recommendations:  (n/a)    Recommendations for follow up therapy are one component of a multi-disciplinary discharge planning process, led by the attending physician.  Recommendations may be updated based on patient status, additional functional criteria and insurance authorization.  Follow up Recommendations No SLP follow up      Assistance Recommended at Discharge  PRN  Functional Status Assessment  (no changes in oropharyngeal swallowing)  Frequency and Duration  (n/a)   (n/a)       Prognosis Prognosis for Safe Diet Advancement: Fair Barriers to Reach Goals: Time post onset;Severity of deficits Barriers/Prognosis Comment: Esophageal phase Dysmotility; suspected tumor/mass in the Esophagus per GI/EGD       Swallow Study   General Date of Onset: 05/17/22 HPI: Pt is a 77 y.o. male with a history of PSVT, HTN, HLD, IDA, etoh abuse and colon cancer s/p robot-assisted laparoscopic right partial colectomy 08/21/20 who is admitted with esophageal dysphagia, anemia, GIB and flu A.   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     1/12- s/p EGD with biopsies of esophageal mass taken for oncology f/u.  NP discussed possible g-tube placement with pt and wife. Pt still a little overwhelmed by decision, but is leaning toward this and IR consult for g-tube placement has been placed.  CXR at admit: No active disease. Type of Study: Bedside Swallow Evaluation Previous Swallow Assessment: none Diet Prior to this Study: NPO Temperature Spikes Noted: No (wbc 4.6) Respiratory Status: Nasal cannula (4L) History of Recent Intubation: Yes Length of Intubations (days): 1 days Date extubated: 05/19/22 Behavior/Cognition: Alert;Cooperative;Pleasant mood Oral Cavity Assessment: Within Functional Limits Oral Care Completed by SLP: Recent completion by staff Oral Cavity - Dentition: Adequate natural dentition Vision: Functional for self-feeding Self-Feeding Abilities: Able to feed self;Needs set up Patient Positioning: Upright in bed (supported) Baseline Vocal Quality: Normal Volitional Cough: Strong Volitional Swallow: Able to elicit    Oral/Motor/Sensory Function Overall Oral Motor/Sensory Function: Within functional limits   Ice Chips Ice chips: Within functional limits Presentation: Spoon (fed; 2 trials)   Thin Liquid Thin Liquid: Within functional limits Presentation: Cup;Self Fed (~5-6 ozs total)    Nectar Thick Nectar Thick Liquid: Not tested   Honey Thick Honey Thick Liquid: Not tested   Puree Puree:  Not tested   Solid     Solid: Not tested         Orinda Kenner, MS, CCC-SLP Speech Language  Pathologist Rehab Services; [place] [phone] (ascom) Londyn Hotard 05/20/2022,6:54 PM

## 2022-05-20 NOTE — H&P (Signed)
[name], MD [address], [place], [place], [place], [address] [address], [place], [place], [place], [address] Phone: [phone]  Fax: [phone]  Primary Care Physician:  Zeb Comfort, MD   Pre-Procedure History & Physical: HPI:  Bobby Moreno is a 77 y.o. male is here for an endoscopy    Past Medical History:  Diagnosis Date   HLD (hyperlipidemia)    HTN (hypertension)    PSVT (paroxysmal supraventricular tachycardia)     Past Surgical History:  Procedure Laterality Date   CATARACT EXTRACTION, BILATERAL  2010   ESOPHAGOGASTRODUODENOSCOPY N/A 05/18/2022   Procedure: ESOPHAGOGASTRODUODENOSCOPY (EGD);  Surgeon: Lin Landsman, MD;  Location: [facility] ENDOSCOPY;  Service: Gastroenterology;  Laterality: N/A;    Prior to Admission medications   Medication Sig Start Date End Date Taking? Authorizing Provider  metoprolol succinate (TOPROL-XL) 50 MG 24 hr tablet Take 50 mg by mouth daily. 04/27/22 04/27/23 Yes [provider]  pravastatin (PRAVACHOL) 40 MG tablet Take 40 mg by mouth at bedtime. 04/27/22  Yes [provider]  nitroGLYCERIN (NITROSTAT) 0.4 MG SL tablet Place 0.4 mg under the tongue every 5 (five) minutes as needed for chest pain (May take up to 3 doses.).    [provider]    Allergies as of 05/17/2022   (No Known Allergies)    Family History  Problem Relation Age of Onset   Hypertension Mother    Dementia Mother    Prostate cancer Father    Emphysema Father     Social History   Socioeconomic History   Marital status: Married    Spouse name: Not on file   Number of children: Not on file   Years of education: Not on file   Highest education level: Not on file  Occupational History   Not on file  Tobacco Use   Smoking status: Former   Smokeless tobacco: Never  Substance and Sexual Activity   Alcohol use: Yes    Alcohol/week: 7.0 standard drinks of alcohol    Types: 7 Cans of beer per week   Drug  use: Never   Sexual activity: Not Currently    Partners: Female  Other Topics Concern   Not on file  Social History Narrative   Not on file   Social Determinants of Health   Financial Resource Strain: Not on file  Food Insecurity: No Food Insecurity (05/18/2022)   Hunger Vital Sign    Worried About Running Out of Food in the Last Year: Never true    Ran Out of Food in the Last Year: Never true  Transportation Needs: No Transportation Needs (05/18/2022)   PRAPARE - Hydrologist (Medical): No    Lack of Transportation (Non-Medical): No  Physical Activity: Not on file  Stress: Not on file  Social Connections: Not on file  Intimate Partner Violence: Not At Risk (05/18/2022)   Humiliation, Afraid, Rape, and Kick questionnaire    Fear of Current or Ex-Partner: No    Emotionally Abused: No    Physically Abused: No    Sexually Abused: No    Review of Systems: See HPI, otherwise negative ROS  Physical Exam: BP 123/75   Pulse 81   Temp 97.7 F (36.5 C) (Oral)   Resp (!) 22   Ht '5\' 9"'$  (1.753 m)   Wt 78.8 kg   SpO2 97%   BMI 25.65 kg/m  General:   Alert,  pleasant and cooperative in NAD  Head:  Normocephalic and atraumatic. Neck:  Supple; no masses or thyromegaly. Lungs:  Clear throughout to auscultation, normal respiratory effort.    Heart:  +S1, +S2, Regular rate and rhythm, No edema. Abdomen:  Soft, nontender and nondistended. Normal bowel sounds, without guarding, and without rebound.   Neurologic:  Alert and  oriented x4;  grossly normal neurologically.  Impression/Plan: Bobby Moreno is here for an endoscopy  to be performed for  evaluation of esophageal mass    Risks, benefits, limitations, and alternatives regarding endoscopy have been reviewed with the patient.  Questions have been answered.  All parties agreeable.   [name], MD  05/20/2022, 12:32 PM

## 2022-05-20 NOTE — Progress Notes (Signed)
Physical Therapy Treatment Patient Details Name: Bobby Moreno MRN: [id number] DOB: [birth year] Today's Date: 05/20/2022   History of Present Illness Pt is a 77 y/o M admitted on 05/17/22 after presenting to the ED with chief concerns of syncope. Pt tested + for Flu A & was found to have sinus tachycardia. While in ED waiting for a bed pt had 3 syncopal episodes, becoming unresponsive & Code Blue initiated on the 3rd. Pt noted to have acute GI bleed with maroon colored stools. 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  PMH: HTN, HLD, paroxysmal supraventricular tachycardia    PT Comments    Pt seen for PT tx with pt agreeable after nurse reports pt is motivated to participate now. Pt is able to complete bed mobility with mod I with hospital bed features, STS with min assist HHA but progressing to [place] for STS with RW. Pt progresses to ambulating a few feet in room with RW & min assist x 3 trials with seated rest between 2/2 fatigue. Pt is making good progress & would continue from ongoing acute PT services to progress independence with functional mobility.    Recommendations for follow up therapy are one component of a multi-disciplinary discharge planning process, led by the attending physician.  Recommendations may be updated based on patient status, additional functional criteria and insurance authorization.  Follow Up Recommendations  Home health PT     Assistance Recommended at Discharge Frequent or constant Supervision/Assistance  Patient can return home with the following A little help with walking and/or transfers;A little help with bathing/dressing/bathroom;Assistance with cooking/housework;Assist for transportation;Help with stairs or ramp for entrance   Equipment Recommendations  Rolling walker (2 wheels)    Recommendations for Other Services       Precautions / Restrictions  Precautions Precautions: Fall Restrictions Weight Bearing Restrictions: No     Mobility  Bed Mobility Overal bed mobility: Modified Independent Bed Mobility: Supine to Sit     Supine to sit: Modified independent (Device/Increase time)     General bed mobility comments: HOB elevated    Transfers Overall transfer level: Needs assistance Equipment used: 1 person hand held assist Transfers: Sit to/from Stand Sit to Stand: Min assist   Step pivot transfers: Min assist (bed>recliner on L with 1UE HHA min assist)       General transfer comment: STS from EOB with 1UE HHA & min assist (3 attempts before success for sit>stand), is able to progress to STS from recliner with RW & CGA with cuing for hand placement.    Ambulation/Gait Ambulation/Gait assistance: Min assist Gait Distance (Feet): 6 Feet (3 ft forwards + 3 ft backwards x 3 times) Assistive device: Rolling walker (2 wheels) Gait Pattern/deviations: Decreased step length - right, Decreased step length - left, Decreased dorsiflexion - right, Decreased stride length, Decreased dorsiflexion - left Gait velocity: decreased     General Gait Details: Cuing to ambulate within base of AD   Stairs             Wheelchair Mobility    Modified Rankin (Stroke Patients Only)       Balance Overall balance assessment: Needs assistance Sitting-balance support: Feet supported, Bilateral upper extremity supported Sitting balance-Leahy Scale: Good Sitting balance - Comments: supervision static sitting   Standing balance support: Bilateral upper extremity supported, During functional activity, Reliant on assistive device for balance Standing balance-Leahy Scale: Fair  Cognition Arousal/Alertness: Awake/alert Behavior During Therapy: WFL for tasks assessed/performed Overall Cognitive Status: Within Functional Limits for tasks assessed                                  General Comments: Pleasant, follows instructions throughout session        Exercises      General Comments General comments (skin integrity, edema, etc.): Pt on 4L/min via nasal cannula, BP WNL despite pt c/o some lightheadedness with mobility      Pertinent Vitals/Pain Pain Assessment Pain Assessment: No/denies pain    Home Living                          Prior Function            PT Goals (current goals can now be found in the care plan section) Acute Rehab PT Goals Patient Stated Goal: get better PT Goal Formulation: With patient Time For Goal Achievement: 06/02/22 Potential to Achieve Goals: Good Progress towards PT goals: Progressing toward goals    Frequency    Min 2X/week      PT Plan Current plan remains appropriate    Co-evaluation              AM-PAC PT "6 Clicks" Mobility   Outcome Measure  Help needed turning from your back to your side while in a flat bed without using bedrails?: None Help needed moving from lying on your back to sitting on the side of a flat bed without using bedrails?: A Little Help needed moving to and from a bed to a chair (including a wheelchair)?: A Little Help needed standing up from a chair using your arms (e.g., wheelchair or bedside chair)?: A Little Help needed to walk in hospital room?: A Little Help needed climbing 3-5 steps with a railing? : A Lot 6 Click Score: 18    End of Session Equipment Utilized During Treatment: Oxygen Activity Tolerance: Patient tolerated treatment well Patient left: in chair;with call bell/phone within reach;with family/visitor present Nurse Communication: Mobility status PT Visit Diagnosis: Unsteadiness on feet (R26.81);Muscle weakness (generalized) (M62.81);Difficulty in walking, not elsewhere classified (R26.2)     Time: 3846-6599 PT Time Calculation (min) (ACUTE ONLY): 24 min  Charges:  $Therapeutic Activity: 23-37 mins                     Lavone Nian,  PT, DPT 05/20/22, 2:30 PM   [name] 05/20/2022, 2:29 PM

## 2022-05-20 NOTE — Progress Notes (Signed)
PT Cancellation:  Attempted to see pt for PT tx with pt received in bed. Pt politely declines PT tx at this time, reporting he'd rather wait until after his procedure is complete today. Will re-attempt as able.  Lavone Nian, PT, DPT 05/20/22, 12:32 PM

## 2022-05-20 NOTE — TOC Initial Note (Signed)
Transition of Care [facility]) - Initial/Assessment Note    Patient Details  Name: Bobby Moreno MRN: [id number] Date of Birth: 1946/01/10  Transition of [facility]) CM/SW Contact:    Shelbie Hutching, RN Phone Number: 05/20/2022, 11:55 AM  Clinical Narrative:                 Patient admitted to the hospital with acute blood loss anemia.  Patient is currently in the ICU he was found to have an esophageal mass, going for EDG today to get biopsies.  RNCM met with patient at the bedside, introduced self and explained role in DC planning. Patient is from home with his wife, he is usually very active and independent and drives.   Patient is current with his PCP Dr. Pricilla Riffle at [facility].  Current physical therapy recommendation is home health.  Asked patient if he would like home health arranged at discharge, he says he would like to see how things go.    TOC will cont to follow.   Expected Discharge Plan: [place] Barriers to Discharge: Continued Medical Work up   Patient Goals and CMS Choice Patient states their goals for this hospitalization and ongoing recovery are:: Patient wants to get back to normal          Expected Discharge Plan and Services   Discharge Planning Services: CM Consult   Living arrangements for the past 2 months: Single Family Home                                      Prior Living Arrangements/Services Living arrangements for the past 2 months: Single Family Home Lives with:: Spouse Patient language and need for interpreter reviewed:: Yes Do you feel safe going back to the place where you live?: Yes      Need for Family Participation in Patient Care: Yes (Comment) Care giver support system in place?: Yes (comment)   Criminal Activity/Legal Involvement Pertinent to Current Situation/Hospitalization: No - Comment as needed  Activities of Daily Living Home Assistive Devices/Equipment: Eyeglasses ADL Screening (condition at time of  admission) Patient's cognitive ability adequate to safely complete daily activities?: Yes Is the patient deaf or have difficulty hearing?: No Does the patient have difficulty seeing, even when wearing glasses/contacts?: No Does the patient have difficulty concentrating, remembering, or making decisions?: No Patient able to express need for assistance with ADLs?: Yes Does the patient have difficulty dressing or bathing?: No Independently performs ADLs?: Yes (appropriate for developmental age) Does the patient have difficulty walking or climbing stairs?: Yes Weakness of Legs: Both Weakness of Arms/Hands: Both  Permission Sought/Granted Permission sought to share information with : Case Manager, Family Supports    Share Information with NAME: Era Skeen     Permission granted to share info w Relationship: spouse  Permission granted to share info w Contact Information: [phone]  Emotional Assessment Appearance:: Appears stated age Attitude/Demeanor/Rapport: Engaged Affect (typically observed): Accepting Orientation: : Oriented to Self, Oriented to Place, Oriented to  Time, Oriented to Situation Alcohol / Substance Use: Not Applicable Psych Involvement: No (comment)  Admission diagnosis:  Palpitations [R00.2] Syncope [R55] Paroxysmal SVT (supraventricular tachycardia) [I47.10] Symptomatic anemia [D64.9] Patient Active Problem List   Diagnosis Date Noted   Esophageal mass 05/20/2022   Other dysphagia 05/18/2022   Symptomatic anemia 05/18/2022   Acute blood loss anemia (ABLA) 05/18/2022   GI bleed 05/18/2022  Hypotension 05/18/2022   Sinus tachycardia by electrocardiogram 05/18/2022   Hemorrhagic shock ([place]) 05/18/2022   Acute upper GI bleed 05/18/2022   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[place]) 05/18/2022   Iron deficiency anemia 05/17/2022   Influenza A 05/17/2022   Elevated troponin 05/17/2022   Acute blood loss anemia 07/11/2020   HTN (hypertension)     HLD (hyperlipidemia)    Near syncope    Leukocytosis    Hematochezia    Thrombocytopenia (HCC)    Acute lower GI bleeding 07/10/2020   PCP:  Zeb Comfort, MD Pharmacy:  No Pharmacies Listed    Social Determinants of Health (SDOH) Social History: SDOH Screenings   Food Insecurity: No Food Insecurity (05/18/2022)  Housing: Low Risk  (05/18/2022)  Transportation Needs: No Transportation Needs (05/18/2022)  Utilities: Not At Risk (05/18/2022)  Tobacco Use: Medium Risk (05/19/2022)   SDOH Interventions:     Readmission Risk Interventions     No data to display

## 2022-05-20 NOTE — Op Note (Signed)
[facility] Patient Name: Bobby Moreno Procedure Date: 05/20/2022 12:28 PM MRN: [id number] Account #: [id number] Date of Birth: [birth year] Admit Type: Outpatient Age: 77 Room: [facility] ENDO ROOM 4 Gender: Male Note Status: Finalized Instrument Name: Michaelle Birks [id number] Procedure:             Upper GI endoscopy Indications:           Dysphagia Providers:             [name] MD, MD Medicines:             Monitored Anesthesia Care Complications:         No immediate complications. Procedure:             Pre-Anesthesia Assessment:                        - Prior to the procedure, a History and Physical was                         performed, and patient medications, allergies and                         sensitivities were reviewed. The patient's tolerance                         of previous anesthesia was reviewed.                        - The risks and benefits of the procedure and the                         sedation options and risks were discussed with the                         patient. All questions were answered and informed                         consent was obtained.                        - ASA Grade Assessment: III - A patient with severe                         systemic disease.                        After obtaining informed consent, the endoscope was                         passed under direct vision. Throughout the procedure,                         the patient's blood pressure, pulse, and oxygen                         saturations were monitored continuously. The Endoscope                         was introduced through the mouth, and advanced to the  third part of duodenum. The upper GI endoscopy was                         accomplished with ease. The patient tolerated the                         procedure well. Findings:      The examined duodenum was normal.      The stomach was normal.      The cardia  and gastric fundus were normal on retroflexion.      A large, ulcerating mass with no bleeding and no stigmata of recent       bleeding was found in the lower third of the esophagus, 38 cm from the       incisors. The mass was partially obstructing and partially       circumferential (involving two thirds of the lumen circumference).       Biopsies were taken with a cold forceps for histology. Mass extends to       the GE junction Impression:            - Normal examined duodenum.                        - Normal stomach.                        - Partially obstructing, rule out malignancy,                         esophageal tumor was found in the lower third of the                         esophagus. Biopsied. Recommendation:        - Return patient to hospital ward for ongoing care.                        - Await pathology results. Procedure Code(s):     --- Professional ---                        [phone], Esophagogastroduodenoscopy, flexible,                         transoral; with biopsy, single or multiple Diagnosis Code(s):     --- Professional ---                        D49.0, Neoplasm of unspecified behavior of digestive                         system                        R13.10, Dysphagia, unspecified CPT copyright 2022 American Medical Association. All rights reserved. The codes documented in this report are preliminary and upon coder review may  be revised to meet current compliance requirements. [name], MD [name] MD, MD 05/20/2022 12:43:09 PM This report has been signed electronically. Number of Addenda: 0 Note Initiated On: 05/20/2022 12:28 PM Estimated Blood Loss:  Estimated blood loss: none.      [facility]

## 2022-05-20 NOTE — Transfer of Care (Signed)
Immediate Anesthesia Transfer of Care Note  Patient: Ladarrius Bogdanski  Procedure(s) Performed: ESOPHAGOGASTRODUODENOSCOPY (EGD) WITH PROPOFOL  Patient Location: PACU and Endoscopy Unit  Anesthesia Type:General  Level of Consciousness: drowsy and patient cooperative  Airway & Oxygen Therapy: Patient Spontanous Breathing and Patient connected to nasal cannula oxygen  Post-op Assessment: Report given to RN and Post -op Vital signs reviewed and stable  Post vital signs: Reviewed and stable  Last Vitals:  Vitals Value Taken Time  BP 116/74 05/20/22 1254  Temp 36.5 C 05/20/22 1246  Pulse 2 05/20/22 1246  Resp 26 05/20/22 1254  SpO2 96 % 05/20/22 1254    Last Pain:  Vitals:   05/20/22 1246  TempSrc: Temporal  PainSc: 0-No pain         Complications: No notable events documented.

## 2022-05-20 NOTE — Consult Note (Signed)
Green NOTE       Patient ID: Xzavion Doswell MRN: [id number] DOB/AGE: 75/22/1947 75 y.o.  Admit date: 05/17/2022 Referring Physician Dr. Mortimer Fries Primary Physician Mcneil Sober ([place]) Primary Cardiologist  Baltazar Najjar, PA-C / Dr. Chryl Heck ([place])  Reason for Consultation paroxymal SVT   HPI: Tayon Parekh is a 89WYO with a PMH of paroxysmal SVT, normal coronaries by [facility] (08/2021), HTN, HLD, colon ca s/p R hemicolectomy w/ ileocolonic anastomosis who presented to [facility] ED 05/17/2022 with palpitations. EMS found him in SVT (HR 200s), gave 2 rounds of adenosine with conversion to NSR. On arrival to [facility] he was febrile to 100.5 and influenza A positive. While in the ED on 1/10 he had witnessed syncopal event, for which a code BLUE was called, ER MD responded and found the patient awake and following commands, diaphoretic, hypotensive, and tachycardic. Also anemic with maroon stools (Hgb drift from 8.'5mg'$  to 7.'1mg'$  throughout the day. GI consulted, CTA abd/pelvis with active bleeding in the proximal stomach. He was intubated for EGD, which revealed partially obstructing esophageal tumor at the GE junction concerning for malignancy. Currently in the ICU, intubated but off sedation, requiring two vasopressors. Cardiology is consulted for assistance with his arrhythmias - evidence of SVT and atrial flutter on tele.   Interval History:  - Hgb still drifting, requiring 2 more units of PRBCs overnight  - extubated afternoon yesterday to [place] [facility], seen by oncology too. GI planning for EGD and bx today  - in NSR on tele rates 24s - 80s  -Says he feels "normal" and denies shortness of breath, chest pain, heart racing  Review of systems complete and found to be negative unless listed above     Past Medical History:  Diagnosis Date   HLD (hyperlipidemia)    HTN (hypertension)    PSVT (paroxysmal supraventricular tachycardia)     Past Surgical History:   Procedure Laterality Date   CATARACT EXTRACTION, BILATERAL  2010   ESOPHAGOGASTRODUODENOSCOPY N/A 05/18/2022   Procedure: ESOPHAGOGASTRODUODENOSCOPY (EGD);  Surgeon: Lin Landsman, MD;  Location: [facility] ENDOSCOPY;  Service: Gastroenterology;  Laterality: N/A;    Medications Prior to Admission  Medication Sig Dispense Refill Last Dose   metoprolol succinate (TOPROL-XL) 50 MG 24 hr tablet Take 50 mg by mouth daily.   05/16/2022   pravastatin (PRAVACHOL) 40 MG tablet Take 40 mg by mouth at bedtime.   05/16/2022   nitroGLYCERIN (NITROSTAT) 0.4 MG SL tablet Place 0.4 mg under the tongue every 5 (five) minutes as needed for chest pain (May take up to 3 doses.).   Unknown at prn   Social History   Socioeconomic History   Marital status: Married    Spouse name: Not on file   Number of children: Not on file   Years of education: Not on file   Highest education level: Not on file  Occupational History   Not on file  Tobacco Use   Smoking status: Former   Smokeless tobacco: Never  Substance and Sexual Activity   Alcohol use: Yes    Alcohol/week: 7.0 standard drinks of alcohol    Types: 7 Cans of beer per week   Drug use: Never   Sexual activity: Not Currently    Partners: Female  Other Topics Concern   Not on file  Social History Narrative   Not on file   Social Determinants of Health   Financial Resource Strain: Not on file  Food Insecurity: No Food Insecurity (  05/18/2022)   Hunger Vital Sign    Worried About Running Out of Food in the Last Year: Never true    [place] in the Last Year: Never true  Transportation Needs: No Transportation Needs (05/18/2022)   PRAPARE - Hydrologist (Medical): No    Lack of Transportation (Non-Medical): No  Physical Activity: Not on file  Stress: Not on file  Social Connections: Not on file  Intimate Partner Violence: Not At Risk (05/18/2022)   Humiliation, Afraid, Rape, and Kick questionnaire    Fear of  Current or Ex-Partner: No    Emotionally Abused: No    Physically Abused: No    Sexually Abused: No    Family History  Problem Relation Age of Onset   Hypertension Mother    Dementia Mother    Prostate cancer Father    Emphysema Father       Intake/Output Summary (Last 24 hours) at 05/20/2022 1000 Last data filed at 05/20/2022 0900 Gross per 24 hour  Intake 4054.02 ml  Output 1325 ml  Net 2729.02 ml     Vitals:   05/20/22 0700 05/20/22 0715 05/20/22 0800 05/20/22 0900  BP: 126/74  120/81 123/75  Pulse: 92  84 81  Resp: (!) 27  (!) 21 (!) 22  Temp:  97.7 F (36.5 C)    TempSrc:  Oral    SpO2: 97%  95% 97%  Weight:      Height:        PHYSICAL EXAM General: Elderly and ill-appearing Caucasian male, well nourished, in no acute distress.  Sitting upright in ICU bed, on [place] oxygen  HEENT:  Normocephalic and atraumatic. Neck:  No JVD.  Lungs: Somewhat short of breath appearing on [place] oxygen, decreased breath sounds without appreciable crackles or wheezes. Heart: HRRR . Normal S1 and S2 without gallops or murmurs.  Abdomen: Non-distended appearing.  Msk: Normal strength and tone for age. Extremities: no peripheral edema.   Neuro: Alert and oriented X 3.   Psych:  Answers questions appropriately.   Labs: Basic Metabolic Panel: Recent Labs    05/19/22 0458 05/20/22 0522  NA 138 139  K 4.7 4.0  CL 109 110  CO2 22 23  GLUCOSE 185* 105*  BUN 45* 31*  CREATININE 0.97 0.71  CALCIUM 7.5* 7.3*  MG 2.4 2.1  PHOS 4.6 2.5    Liver Function Tests: Recent Labs    05/17/22 1847 05/19/22 0458  AST 37 34  ALT 23 19  ALKPHOS 57 34*  BILITOT 0.4 0.3  PROT 6.3* 4.2*  ALBUMIN 3.1* 2.1*    No results for input(s): "LIPASE", "AMYLASE" in the last 72 hours. CBC: Recent Labs    05/17/22 1847 05/18/22 0405 05/19/22 0458 05/19/22 1117 05/20/22 0053 05/20/22 0522  WBC 8.9   < > 11.5*  --   --  4.6  NEUTROABS 7.3  --   --   --   --  3.5  HGB 9.8*   < > 8.6*   < >  8.7* 8.6*  HCT 30.8*   < > 26.1*   < > 25.5* 26.0*  MCV 96.3   < > 88.2  --   --  89.3  PLT 255   < > 215  --   --  102*   < > = values in this interval not displayed.    Cardiac Enzymes: Recent Labs    05/17/22 2131 05/17/22 2250 05/18/22 0405  TROPONINIHS  73* 72* 52*    BNP: Recent Labs    05/17/22 1847 05/18/22 2036  BNP 60.7 100.2*    D-Dimer: No results for input(s): "DDIMER" in the last 72 hours. Hemoglobin A1C: No results for input(s): "HGBA1C" in the last 72 hours. Fasting Lipid Panel: Recent Labs    05/19/22 0500  TRIG 138    Thyroid Function Tests: No results for input(s): "TSH", "T4TOTAL", "T3FREE", "THYROIDAB" in the last 72 hours.  Invalid input(s): "FREET3" Anemia Panel: Recent Labs    05/18/22 0405 05/18/22 2037  VITAMINB12  --  1,156*  FOLATE 14.5  --       Radiology: [facility] Chest Port 1 View  Result Date: 05/18/2022 CLINICAL DATA:  Status post central line placement. EXAM: PORTABLE CHEST 1 VIEW COMPARISON:  May 17, 2022 FINDINGS: Endotracheal tube is seen with its distal tip approximately 5.5 cm from the carina. A left internal jugular venous catheter is noted with its distal tip near the junction of the superior vena cava and right atrium. The heart size and mediastinal contours are within normal limits. Mild bilateral infrahilar atelectasis is noted. There is no evidence of a pleural effusion or pneumothorax. The visualized skeletal structures are unremarkable. IMPRESSION: 1. Endotracheal tube and left internal jugular venous catheter positioning, as described above. 2. Mild bilateral infrahilar atelectasis. Electronically Signed   By: Virgina Norfolk M.D.   On: 05/18/2022 21:55   CT ANGIO GI BLEED  Result Date: 05/18/2022 CLINICAL DATA:  GI bleeding. EXAM: CTA ABDOMEN AND PELVIS WITHOUT AND WITH CONTRAST TECHNIQUE: Multidetector CT imaging of the abdomen and pelvis was performed using the standard protocol during bolus administration of  intravenous contrast. Multiplanar reconstructed images and MIPs were obtained and reviewed to evaluate the vascular anatomy. RADIATION DOSE REDUCTION: This exam was performed according to the departmental dose-optimization program which includes automated exposure control, adjustment of the mA and/or kV according to patient size and/or use of iterative reconstruction technique. CONTRAST:  161m OMNIPAQUE IOHEXOL 350 MG/ML SOLN COMPARISON:  CT abdomen and pelvis 07/10/2020 FINDINGS: VASCULAR Aorta: Normal caliber aorta without aneurysm, dissection, vasculitis or significant stenosis. Are atherosclerotic calcifications of the aorta. Celiac: Patent without evidence of aneurysm, dissection, vasculitis or significant stenosis. SMA: Patent without evidence of aneurysm, dissection, vasculitis or significant stenosis. There is mild noncalcified atherosclerotic disease in the proximal SMA. Renals: Both renal arteries are patent without evidence of aneurysm, dissection, vasculitis, fibromuscular dysplasia or significant stenosis. Accessory left renal artery present. IMA: Patent without evidence of aneurysm, dissection, vasculitis or significant stenosis. Inflow: Patent without evidence of aneurysm, dissection, vasculitis or significant stenosis. Proximal Outflow: Not well opacified secondary to timing of the contrast bolus. Veins: No obvious venous abnormality within the limitations of this arterial phase study. Review of the MIP images confirms the above findings. NON-VASCULAR Lower chest: There is peribronchial wall thickening and plugging in the right lower lobe with a small amount of airspace disease. Hepatobiliary: There is a 4.8 x 4.1 cm cyst in the inferior right liver. There is an indeterminate rim enhancing lesion in the left lobe of the liver measuring 15 mm seen on image 17/24. There is a second similar appearing lesion in the left lobe of the liver measuring 12 mm image 17/30. Gallbladder and bile ducts are within  normal limits. Pancreas: Unremarkable. No pancreatic ductal dilatation or surrounding inflammatory changes. Spleen: Normal in size without focal abnormality. Adrenals/Urinary Tract: Are 2 left renal calculi measuring up 2 9 mm. There is left renal cortical scarring. There  is no hydronephrosis in either kidney. There are scattered subcentimeter cortical cysts in both kidneys. The adrenal glands and bladder are within normal limits. Stomach/Bowel: The stomach is moderately distended. There is an area of active bleeding seen within the proximal stomach near the gastroesophageal junction. Appendix appears normal. No evidence of bowel wall thickening, distention, or inflammatory changes. The appendix is not visualized. Lymphatic: No enlarged lymph nodes are seen. Reproductive: Prostate gland is enlarged. Right-sided hydrocele present. Other: No abdominal wall hernia or abnormality. No abdominopelvic ascites. Musculoskeletal: There is moderate compression deformity of L2 which appears unchanged. IMPRESSION: 1. Active bleeding in the proximal stomach near the gastroesophageal junction. Aortic Atherosclerosis (ICD10-I70.0). NON-VASCULAR 1. Moderate distention of the stomach. 2. New indeterminate rim enhancing lesions in the left lobe of the liver. Recommend further evaluation with MRI. 3. Nonobstructing left renal calculi. 4. Prostatomegaly. 5. Right-sided hydrocele. These results were called by telephone at the time of interpretation on 05/18/2022 at 8:30 pm to provider BRITTON RUST-CHESTER , who verbally acknowledged these results. Electronically Signed   By: Ronney Asters M.D.   On: 05/18/2022 20:33   ECHOCARDIOGRAM COMPLETE  Result Date: 05/18/2022    ECHOCARDIOGRAM REPORT   Patient Name:   DOMINIQUE CALVEY Date of Exam: 05/18/2022 Medical Rec #:  [id number]   Height:       69.0 in Accession #:    [id number]  Weight:       175.0 lb Date of Birth:  Apr 17, 1946   BSA:          1.952 m Patient Age:    85 years    BP:            121/76 mmHg Patient Gender: M           HR:           101 bpm. Exam Location:  [facility] Procedure: 2D Echo, Cardiac Doppler and Color Doppler Indications:     Syncope R55                  Elevated troponin  History:         Patient has no prior history of Echocardiogram examinations.                  Risk Factors:Hypertension and Dyslipidemia. PSVT.  Sonographer:     Sherrie Sport Referring Phys:  [id number] AMY N COX Diagnosing Phys: Kate Sable MD  Sonographer Comments: Suboptimal apical window and no subcostal window. IMPRESSIONS  1. Left ventricular ejection fraction, by estimation, is 65 to 70%. The left ventricle has normal function. The left ventricle has no regional wall motion abnormalities. There is mild left ventricular hypertrophy. Left ventricular diastolic parameters are consistent with Grade I diastolic dysfunction (impaired relaxation).  2. Right ventricular systolic function is normal. The right ventricular size is normal.  3. The mitral valve is normal in structure. No evidence of mitral valve regurgitation.  4. The aortic valve is tricuspid. Aortic valve regurgitation is mild. Aortic valve sclerosis/calcification is present, without any evidence of aortic stenosis.  5. Aortic dilatation noted. There is mild dilatation of the ascending aorta, measuring 42 mm. There is mild dilatation of the aortic root, measuring 39 mm. FINDINGS  Left Ventricle: Left ventricular ejection fraction, by estimation, is 65 to 70%. The left ventricle has normal function. The left ventricle has no regional wall motion abnormalities. The left ventricular internal cavity size was normal in size. There is  mild left ventricular hypertrophy. Left ventricular diastolic parameters  are consistent with Grade I diastolic dysfunction (impaired relaxation). Right Ventricle: The right ventricular size is normal. No increase in right ventricular wall thickness. Right ventricular systolic function is normal. Left Atrium: Left atrial size  was normal in size. Right Atrium: Right atrial size was normal in size. Pericardium: There is no evidence of pericardial effusion. Mitral Valve: The mitral valve is normal in structure. No evidence of mitral valve regurgitation. Tricuspid Valve: The tricuspid valve is normal in structure. Tricuspid valve regurgitation is trivial. Aortic Valve: The aortic valve is tricuspid. Aortic valve regurgitation is mild. Aortic valve sclerosis/calcification is present, without any evidence of aortic stenosis. Aortic valve mean gradient measures 3.0 mmHg. Aortic valve peak gradient measures 4.9 mmHg. Aortic valve area, by VTI measures 4.43 cm. Pulmonic Valve: The pulmonic valve was not well visualized. Pulmonic valve regurgitation is not visualized. Aorta: Aortic dilatation noted. There is mild dilatation of the ascending aorta, measuring 42 mm. There is mild dilatation of the aortic root, measuring 39 mm. Venous: The inferior vena cava was not well visualized. IAS/Shunts: No atrial level shunt detected by color flow Doppler.  LEFT VENTRICLE PLAX 2D LVIDd:         3.80 cm   Diastology LVIDs:         2.60 cm   LV e' medial:    9.90 cm/s LV PW:         1.20 cm   LV E/e' medial:  7.0 LV IVS:        1.20 cm   LV e' lateral:   10.60 cm/s LVOT diam:     2.20 cm   LV E/e' lateral: 6.6 LV SV:         67 LV SV Index:   34 LVOT Area:     3.80 cm  RIGHT VENTRICLE RV Basal diam:  2.70 cm RV Mid diam:    2.40 cm RV S prime:     18.00 cm/s TAPSE (M-mode): 2.0 cm LEFT ATRIUM             Index        RIGHT ATRIUM           Index LA diam:        3.40 cm 1.74 cm/m   RA Area:     16.20 cm LA Vol (A2C):   51.2 ml 26.23 ml/m  RA Volume:   41.20 ml  21.11 ml/m LA Vol (A4C):   36.9 ml 18.90 ml/m LA Biplane Vol: 46.6 ml 23.87 ml/m  AORTIC VALVE AV Area (Vmax):    4.08 cm AV Area (Vmean):   4.04 cm AV Area (VTI):     4.43 cm AV Vmax:           111.00 cm/s AV Vmean:          77.000 cm/s AV VTI:            0.152 m AV Peak Grad:      4.9 mmHg AV  Mean Grad:      3.0 mmHg LVOT Vmax:         119.00 cm/s LVOT Vmean:        81.800 cm/s LVOT VTI:          0.177 m LVOT/AV VTI ratio: 1.16  AORTA Ao Root diam: 3.94 cm MITRAL VALVE                TRICUSPID VALVE MV Area (PHT): 4.71 cm     TR Peak grad:  12.4 mmHg MV Decel Time: 161 msec     TR Vmax:        176.00 cm/s MV E velocity: 69.70 cm/s MV A velocity: 110.00 cm/s  SHUNTS MV E/A ratio:  0.63         Systemic VTI:  0.18 m                             Systemic Diam: 2.20 cm Kate Sable MD Electronically signed by Kate Sable MD Signature Date/Time: 05/18/2022/2:48:12 PM    Final    DG Chest Port 1 View  Result Date: 05/17/2022 CLINICAL DATA:  Syncope EXAM: PORTABLE CHEST 1 VIEW COMPARISON:  None Available. FINDINGS: The heart size and mediastinal contours are within normal limits. Both lungs are clear. The visualized skeletal structures are unremarkable. IMPRESSION: No active disease. Electronically Signed   By: Ronney Asters M.D.   On: 05/17/2022 22:46    L[facility]  Cardiac catheterization April 2023: Impressions: 1. The coronaries are a left dominant system. There is no significant CAD. 2. Normal hemodynamics 3. Right radial sheath removed in lab, TR band placed   ECHO 05/18/2022  1. Left ventricular ejection fraction, by estimation, is 65 to 70%. The  left ventricle has normal function. The left ventricle has no regional  wall motion abnormalities. There is mild left ventricular hypertrophy.  Left ventricular diastolic parameters  are consistent with Grade I diastolic dysfunction (impaired relaxation).   2. Right ventricular systolic function is normal. The right ventricular  size is normal.   3. The mitral valve is normal in structure. No evidence of mitral valve  regurgitation.   4. The aortic valve is tricuspid. Aortic valve regurgitation is mild.  Aortic valve sclerosis/calcification is present, without any evidence of  aortic stenosis.   5. Aortic dilatation noted. There is  mild dilatation of the ascending  aorta, measuring 42 mm. There is mild dilatation of the aortic root,  measuring 39 mm.   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 EMS found him in SVT (HR 200s), gave 2 rounds of adenosine with conversion to NSR. On arrival to [facility] he was febrile to 100.5 and influenza A positive. While in the ED on 1/10 he had witnessed syncopal event, for which a code BLUE was called, ER MD responded and found the patient awake and following commands, diaphoretic, hypotensive, and tachycardic. Also anemic with maroon stools (Hgb drift from 8.'5mg'$  to 7.'1mg'$  throughout the day. GI consulted, CTA abd/pelvis with active bleeding in the proximal stomach. He was  intubated for EGD, which revealed partially obstructing esophageal tumor at the GE junction concerning for malignancy. Currently in the ICU, off vasopressor support and extubated 1/13 to [place]. Cardiology is consulted for assistance with his arrhythmias - evidence of SVT and atrial flutter on tele.   # near syncope # melena  # symptomatic anemia 2/2 bleeding esophageal tumor  -Ongoing workup with GI & oncology,  planning repeat upper endoscopy + bx today morning if the patient is stable.  Hgb continues to drift requiring 2 more units of PRBCs overnight  # influenza A  Supportive care, management per primary team -extubated and on [place] oxygen now  # paroxysmal SVT  # new atrial flutter RVR  Compensatory for acute blood loss anemia and influenza as above, exacerbated by vasopressors. Overnight on 1/10 he was tachycardic with rates as high as 160s and what appears to be new onset atrial flutter/fib. Now in NSR and rate controlled. He has a CHA2DS2-VASc score of 2 (age, HTN), currently anticoagulation is contraindicated while there is active bleeding + ongoing workup of his esophageal tumor. AFL/fib provoked by ABLA as above.  -continuous tele while inpatient -off vasopressors entirely 1/12, normotensive -Monitor and replete electrolytes for a K >4, mag >2 -Continue to treat underlying causes of increased adrenergic tone including fever, pain, anemia, infection, etc. -Restart scheduled metoprolol tartrate 12.5 mg twice daily once able to take p.o. from a GI perspective following EGD, uptitrate as needed -Echo complete resulted with preserved LVEF, g1 diastolic dysfunction, aortic valve sclerosis without stenosis or other valve insufficiencies. Mild dilation of aortic root at 13m.  -No further cardiac diagnostics necessary.  # HFpEF Euvolemic to dry on exam. BNP 100.2.   #demand ischemia  Minimal elevation in trends 33, 73, 72, 52 in the setting of acute blood loss anemia and tachycardia this is most consistent with demand/supply mismatch and not ACS   This patient's plan of care was discussed and created with Dr. CClayborn Bignessand he is in agreement.  Signed: LTristan Schroeder, PA-C 05/20/2022, 10:00 AM [facility]

## 2022-05-20 NOTE — Anesthesia Postprocedure Evaluation (Signed)
Anesthesia Post Note  Patient: Bobby Moreno  Procedure(s) Performed: ESOPHAGOGASTRODUODENOSCOPY (EGD) WITH PROPOFOL  Patient location during evaluation: Endoscopy Anesthesia Type: General Level of consciousness: awake and alert Pain management: pain level controlled Vital Signs Assessment: post-procedure vital signs reviewed and stable Respiratory status: spontaneous breathing, nonlabored ventilation, respiratory function stable and patient connected to nasal cannula oxygen Cardiovascular status: blood pressure returned to baseline and stable Postop Assessment: no apparent nausea or vomiting Anesthetic complications: no   No notable events documented.   Last Vitals:  Vitals:   05/20/22 1256 05/20/22 1300  BP: 114/60 108/73  Pulse:  85  Resp: (!) 26 16  Temp:    SpO2: 97% 98%    Last Pain:  Vitals:   05/20/22 1256  TempSrc:   PainSc: 0-No pain                 Ilene Qua

## 2022-05-20 NOTE — Progress Notes (Signed)
Bobby Moreno , MD [address] [place], [place], [place], [address] [id number] [address], [place], [place], [place], [address] Phone: [phone]  Fax: [phone]   Bobby Moreno is being followed for upper GI bleed  Day 2 of follow up   Subjective: No further overt blood loss in terms of melena patient feels well no abdominal pain extubated not feeling short of breath   Objective: Vital signs in last 24 hours: Vitals:   05/20/22 0500 05/20/22 0600 05/20/22 0700 05/20/22 0715  BP: 120/67 131/65 126/74   Pulse: 82 77 92   Resp: (!) 23 (!) 25 (!) 27   Temp:    97.7 F (36.5 C)  TempSrc:    Oral  SpO2: 96% 96% 97%   Weight: 78.8 kg     Height:       Weight change: -0.4 kg  Intake/Output Summary (Last 24 hours) at 05/20/2022 9470 Last data filed at 05/20/2022 0700 Gross per 24 hour  Intake 4161.4 ml  Output 975 ml  Net 3186.4 ml     Exam: Heart:: Regular rate and rhythm or S1S2 present Lungs: normal Abdomen: soft, nontender, normal bowel sounds   Lab Results: '@LABTEST2'$ @ Micro Results: Recent Results (from the past 240 hour(s))  Resp panel by RT-PCR (RSV, Flu A&B, Covid) Anterior Nasal Swab     Status: Abnormal   Collection Time: 05/17/22  7:53 PM   Specimen: Anterior Nasal Swab  Result Value Ref Range Status   SARS Coronavirus 2 by RT PCR NEGATIVE NEGATIVE Final    Comment: (NOTE) SARS-CoV-2 target nucleic acids are NOT DETECTED.  The SARS-CoV-2 RNA is generally detectable in upper respiratory specimens during the acute phase of infection. The lowest concentration of SARS-CoV-2 viral copies this assay can detect is 138 copies/mL. A negative result does not preclude SARS-Cov-2 infection and should not be used as the sole basis for treatment or other patient management decisions. A negative result may occur with  improper specimen collection/handling, submission of specimen other than nasopharyngeal swab, presence of viral mutation(s) within the areas targeted  by this assay, and inadequate number of viral copies(<138 copies/mL). A negative result must be combined with clinical observations, patient history, and epidemiological information. The expected result is Negative.  Fact Sheet for Patients:  EntrepreneurPulse.com.au  Fact Sheet for Healthcare Providers:  IncredibleEmployment.be  This test is no t yet approved or cleared by the [place] FDA and  has been authorized for detection and/or diagnosis of SARS-CoV-2 by FDA under an Emergency Use Authorization (EUA). This EUA will remain  in effect (meaning this test can be used) for the duration of the COVID-19 declaration under Section 564(b)(1) of the Act, 21 U.S.C.section 360bbb-3(b)(1), unless the authorization is terminated  or revoked sooner.       Influenza A by PCR POSITIVE (A) NEGATIVE Final   Influenza B by PCR NEGATIVE NEGATIVE Final    Comment: (NOTE) The Xpert Xpress SARS-CoV-2/FLU/RSV plus assay is intended as an aid in the diagnosis of influenza from Nasopharyngeal swab specimens and should not be used as a sole basis for treatment. Nasal washings and aspirates are unacceptable for Xpert Xpress SARS-CoV-2/FLU/RSV testing.  Fact Sheet for Patients: EntrepreneurPulse.com.au  Fact Sheet for Healthcare Providers: IncredibleEmployment.be  This test is not yet approved or cleared by the [place] FDA and has been authorized for detection and/or diagnosis of SARS-CoV-2 by FDA under an Emergency Use Authorization (EUA). This EUA will remain in effect (meaning this test can be used)  for the duration of the COVID-19 declaration under Section 564(b)(1) of the Act, 21 U.S.C. section 360bbb-3(b)(1), unless the authorization is terminated or revoked.     Resp Syncytial Virus by PCR NEGATIVE NEGATIVE Final    Comment: (NOTE) Fact Sheet for Patients: EntrepreneurPulse.com.au  Fact  Sheet for Healthcare Providers: IncredibleEmployment.be  This test is not yet approved or cleared by the [place] FDA and has been authorized for detection and/or diagnosis of SARS-CoV-2 by FDA under an Emergency Use Authorization (EUA). This EUA will remain in effect (meaning this test can be used) for the duration of the COVID-19 declaration under Section 564(b)(1) of the Act, 21 U.S.C. section 360bbb-3(b)(1), unless the authorization is terminated or revoked.  Performed at [facility], [address] [place], [place] [address]   MRSA Next Gen by PCR, Nasal     Status: None   Collection Time: 05/18/22  5:16 PM   Specimen: Nasal Mucosa; Nasal Swab  Result Value Ref Range Status   MRSA by PCR Next Gen NOT DETECTED NOT DETECTED Final    Comment: (NOTE) The GeneXpert MRSA Assay (FDA approved for NASAL specimens only), is one component of a comprehensive MRSA colonization surveillance program. It is not intended to diagnose MRSA infection nor to guide or monitor treatment for MRSA infections. Test performance is not FDA approved in patients less than 110 years old. Performed at [facility], [address] [place], [place] [address]    Studies/Results: [facility] Chest Port 1 View  Result Date: 05/18/2022 CLINICAL DATA:  Status post central line placement. EXAM: PORTABLE CHEST 1 VIEW COMPARISON:  May 17, 2022 FINDINGS: Endotracheal tube is seen with its distal tip approximately 5.5 cm from the carina. A left internal jugular venous catheter is noted with its distal tip near the junction of the superior vena cava and right atrium. The heart size and mediastinal contours are within normal limits. Mild bilateral infrahilar atelectasis is noted. There is no evidence of a pleural effusion or pneumothorax. The visualized skeletal structures are unremarkable. IMPRESSION: 1. Endotracheal tube and left internal jugular venous catheter positioning, as  described above. 2. Mild bilateral infrahilar atelectasis. Electronically Signed   By: Virgina Norfolk M.D.   On: 05/18/2022 21:55   CT ANGIO GI BLEED  Result Date: 05/18/2022 CLINICAL DATA:  GI bleeding. EXAM: CTA ABDOMEN AND PELVIS WITHOUT AND WITH CONTRAST TECHNIQUE: Multidetector CT imaging of the abdomen and pelvis was performed using the standard protocol during bolus administration of intravenous contrast. Multiplanar reconstructed images and MIPs were obtained and reviewed to evaluate the vascular anatomy. RADIATION DOSE REDUCTION: This exam was performed according to the departmental dose-optimization program which includes automated exposure control, adjustment of the mA and/or kV according to patient size and/or use of iterative reconstruction technique. CONTRAST:  147m OMNIPAQUE IOHEXOL 350 MG/ML SOLN COMPARISON:  CT abdomen and pelvis 07/10/2020 FINDINGS: VASCULAR Aorta: Normal caliber aorta without aneurysm, dissection, vasculitis or significant stenosis. Are atherosclerotic calcifications of the aorta. Celiac: Patent without evidence of aneurysm, dissection, vasculitis or significant stenosis. SMA: Patent without evidence of aneurysm, dissection, vasculitis or significant stenosis. There is mild noncalcified atherosclerotic disease in the proximal SMA. Renals: Both renal arteries are patent without evidence of aneurysm, dissection, vasculitis, fibromuscular dysplasia or significant stenosis. Accessory left renal artery present. IMA: Patent without evidence of aneurysm, dissection, vasculitis or significant stenosis. Inflow: Patent without evidence of aneurysm, dissection, vasculitis or significant stenosis. Proximal Outflow: Not well opacified secondary to timing of the contrast bolus. Veins:  No obvious venous abnormality within the limitations of this arterial phase study. Review of the MIP images confirms the above findings. NON-VASCULAR Lower chest: There is peribronchial wall thickening and  plugging in the right lower lobe with a small amount of airspace disease. Hepatobiliary: There is a 4.8 x 4.1 cm cyst in the inferior right liver. There is an indeterminate rim enhancing lesion in the left lobe of the liver measuring 15 mm seen on image 17/24. There is a second similar appearing lesion in the left lobe of the liver measuring 12 mm image 17/30. Gallbladder and bile ducts are within normal limits. Pancreas: Unremarkable. No pancreatic ductal dilatation or surrounding inflammatory changes. Spleen: Normal in size without focal abnormality. Adrenals/Urinary Tract: Are 2 left renal calculi measuring up 2 9 mm. There is left renal cortical scarring. There is no hydronephrosis in either kidney. There are scattered subcentimeter cortical cysts in both kidneys. The adrenal glands and bladder are within normal limits. Stomach/Bowel: The stomach is moderately distended. There is an area of active bleeding seen within the proximal stomach near the gastroesophageal junction. Appendix appears normal. No evidence of bowel wall thickening, distention, or inflammatory changes. The appendix is not visualized. Lymphatic: No enlarged lymph nodes are seen. Reproductive: Prostate gland is enlarged. Right-sided hydrocele present. Other: No abdominal wall hernia or abnormality. No abdominopelvic ascites. Musculoskeletal: There is moderate compression deformity of L2 which appears unchanged. IMPRESSION: 1. Active bleeding in the proximal stomach near the gastroesophageal junction. Aortic Atherosclerosis (ICD10-I70.0). NON-VASCULAR 1. Moderate distention of the stomach. 2. New indeterminate rim enhancing lesions in the left lobe of the liver. Recommend further evaluation with MRI. 3. Nonobstructing left renal calculi. 4. Prostatomegaly. 5. Right-sided hydrocele. These results were called by telephone at the time of interpretation on 05/18/2022 at 8:30 pm to provider BRITTON RUST-CHESTER , who verbally acknowledged these  results. Electronically Signed   By: Ronney Asters M.D.   On: 05/18/2022 20:33   ECHOCARDIOGRAM COMPLETE  Result Date: 05/18/2022    ECHOCARDIOGRAM REPORT   Patient Name:   Bobby Moreno Date of Exam: 05/18/2022 Medical Rec #:  [id number]   Height:       69.0 in Accession #:    [id number]  Weight:       175.0 lb Date of Birth:  [birth year]   BSA:          1.952 m Patient Age:    57 years    BP:           121/76 mmHg Patient Gender: M           HR:           101 bpm. Exam Location:  [facility] Procedure: 2D Echo, Cardiac Doppler and Color Doppler Indications:     Syncope R55                  Elevated troponin  History:         Patient has no prior history of Echocardiogram examinations.                  Risk Factors:Hypertension and Dyslipidemia. PSVT.  Sonographer:     Sherrie Sport Referring Phys:  [id number] AMY N COX Diagnosing Phys: Kate Sable MD  Sonographer Comments: Suboptimal apical window and no subcostal window. IMPRESSIONS  1. Left ventricular ejection fraction, by estimation, is 65 to 70%. The left ventricle has normal function. The left ventricle has no regional wall motion abnormalities. There is mild left ventricular hypertrophy.  Left ventricular diastolic parameters are consistent with Grade I diastolic dysfunction (impaired relaxation).  2. Right ventricular systolic function is normal. The right ventricular size is normal.  3. The mitral valve is normal in structure. No evidence of mitral valve regurgitation.  4. The aortic valve is tricuspid. Aortic valve regurgitation is mild. Aortic valve sclerosis/calcification is present, without any evidence of aortic stenosis.  5. Aortic dilatation noted. There is mild dilatation of the ascending aorta, measuring 42 mm. There is mild dilatation of the aortic root, measuring 39 mm. FINDINGS  Left Ventricle: Left ventricular ejection fraction, by estimation, is 65 to 70%. The left ventricle has normal function. The left ventricle has no regional wall motion  abnormalities. The left ventricular internal cavity size was normal in size. There is  mild left ventricular hypertrophy. Left ventricular diastolic parameters are consistent with Grade I diastolic dysfunction (impaired relaxation). Right Ventricle: The right ventricular size is normal. No increase in right ventricular wall thickness. Right ventricular systolic function is normal. Left Atrium: Left atrial size was normal in size. Right Atrium: Right atrial size was normal in size. Pericardium: There is no evidence of pericardial effusion. Mitral Valve: The mitral valve is normal in structure. No evidence of mitral valve regurgitation. Tricuspid Valve: The tricuspid valve is normal in structure. Tricuspid valve regurgitation is trivial. Aortic Valve: The aortic valve is tricuspid. Aortic valve regurgitation is mild. Aortic valve sclerosis/calcification is present, without any evidence of aortic stenosis. Aortic valve mean gradient measures 3.0 mmHg. Aortic valve peak gradient measures 4.9 mmHg. Aortic valve area, by VTI measures 4.43 cm. Pulmonic Valve: The pulmonic valve was not well visualized. Pulmonic valve regurgitation is not visualized. Aorta: Aortic dilatation noted. There is mild dilatation of the ascending aorta, measuring 42 mm. There is mild dilatation of the aortic root, measuring 39 mm. Venous: The inferior vena cava was not well visualized. IAS/Shunts: No atrial level shunt detected by color flow Doppler.  LEFT VENTRICLE PLAX 2D LVIDd:         3.80 cm   Diastology LVIDs:         2.60 cm   LV e' medial:    9.90 cm/s LV PW:         1.20 cm   LV E/e' medial:  7.0 LV IVS:        1.20 cm   LV e' lateral:   10.60 cm/s LVOT diam:     2.20 cm   LV E/e' lateral: 6.6 LV SV:         67 LV SV Index:   34 LVOT Area:     3.80 cm  RIGHT VENTRICLE RV Basal diam:  2.70 cm RV Mid diam:    2.40 cm RV S prime:     18.00 cm/s TAPSE (M-mode): 2.0 cm LEFT ATRIUM             Index        RIGHT ATRIUM           Index LA  diam:        3.40 cm 1.74 cm/m   RA Area:     16.20 cm LA Vol (A2C):   51.2 ml 26.23 ml/m  RA Volume:   41.20 ml  21.11 ml/m LA Vol (A4C):   36.9 ml 18.90 ml/m LA Biplane Vol: 46.6 ml 23.87 ml/m  AORTIC VALVE AV Area (Vmax):    4.08 cm AV Area (Vmean):   4.04 cm AV Area (VTI):  4.43 cm AV Vmax:           111.00 cm/s AV Vmean:          77.000 cm/s AV VTI:            0.152 m AV Peak Grad:      4.9 mmHg AV Mean Grad:      3.0 mmHg LVOT Vmax:         119.00 cm/s LVOT Vmean:        81.800 cm/s LVOT VTI:          0.177 m LVOT/AV VTI ratio: 1.16  AORTA Ao Root diam: 3.94 cm MITRAL VALVE                TRICUSPID VALVE MV Area (PHT): 4.71 cm     TR Peak grad:   12.4 mmHg MV Decel Time: 161 msec     TR Vmax:        176.00 cm/s MV E velocity: 69.70 cm/s MV A velocity: 110.00 cm/s  SHUNTS MV E/A ratio:  0.63         Systemic VTI:  0.18 m                             Systemic Diam: 2.20 cm Kate Sable MD Electronically signed by Kate Sable MD Signature Date/Time: 05/18/2022/2:48:12 PM    Final    Medications: I have reviewed the patient's current medications. Scheduled Meds:  Chlorhexidine Gluconate Cloth  6 each Topical Daily   folic acid  1 mg Intravenous Daily   [START ON 05/22/2022] pantoprazole  40 mg Intravenous Q12H   pravastatin  40 mg Per Tube QHS   thiamine (VITAMIN B1) injection  100 mg Intravenous Daily   Continuous Infusions:  sodium chloride 10 mL/hr at 05/20/22 0700   cefTRIAXone (ROCEPHIN)  IV Stopped (05/19/22 2128)   lactated ringers 100 mL/hr at 05/20/22 0700   pantoprazole 8 mg/hr (05/20/22 0700)   phenylephrine (NEO-SYNEPHRINE) Adult infusion Stopped (05/19/22 1905)   vasopressin Stopped (05/19/22 1206)   PRN Meds:.acetaminophen **OR** acetaminophen, ondansetron **OR** ondansetron (ZOFRAN) IV, mouth rinse, senna-docusate   Assessment: Principal Problem:   Acute blood loss anemia (ABLA) Active Problems:   HTN (hypertension)   HLD (hyperlipidemia)   Near  syncope   Iron deficiency anemia   Influenza A   Elevated troponin   Other dysphagia   Symptomatic anemia   GI bleed   Hypotension   Sinus tachycardia by electrocardiogram   Hemorrhagic shock (HCC)   Acute upper GI bleed   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 who was admitted with a syncopal episode was found to have iron deficiency anemia and during process of evaluation developed melena and became hypotensive last night, CT angiogram showed bleeding from the proximal part of the stomach had to have an emergent upper endoscopy last night which showed a partially obstructive lesion in the GE junction extending to the cardia which was bleeding, hemostasis was achieved with Hemospray.  The appearance is concerning for malignancy.Hb stable since yesterday .  Status post extubation doing well.   Plan: 1.  EGD to obtain biopsy from esophageal mass today   2.  Monitor CBC and transfuse as needed   I have discussed alternative options, risks & benefits,  which include, but are not limited to, bleeding, infection, perforation,respiratory complication & drug reaction.  The patient agrees with this plan &  written consent will be obtained.     LOS: 3 days   Bobby Bellows, MD 05/20/2022, 8:11 AM

## 2022-05-20 NOTE — Progress Notes (Signed)
NAME:  Bobby Moreno, MRN:  [id number], DOB:  1945/06/05, LOS: 3 ADMISSION DATE:  05/17/2022, CONSULTATION DATE: 05/18/22 REFERRING MD: Dr. Sheppard Coil, CHIEF COMPLAINT: GI Bleed    History of Present Illness:  This is a 77 yo male with a hx of Paroxysmal SVT, HTN, and Anemia.  He presented to [facility] ER via EMS with palpitations.  Upon EMS arrival at pts home he was found to be in SVT and hypotensive initial bp 70/40.  Attempted vagal maneuvers without improvement. He received 6 mg of adenosine followed by 12 mg of adenosine and converted to normal sinus rhythm.  He denies taking blood thinners, aspirin, or ibuprofen.  He does endorse drinking daily 2 beers and a glass of liquor.  His last alcoholic beverage was 2 weeks ago   ED Course  Upon arrival to the ER pt tachycardic.  EKG revealed sinus tachycardia, heart rate 127, no evidence of ischemia.  Pt incidentally found to be influenza positive.  Pt initially admitted to the progressive care unit per hospitalist team for additional workup and treatment, however he remained in the ER pending bed availability.  See detailed hospital course under significant events.   Initial vital signs: Temp 100.5, bp 122/76, hr 126, O2 sats 93% on RA  Significant labs: glucose 115/BUN 31/calcium 7.4/troponin 52/hgb 8.5 CXR: No active disease   Pertinent  Medical History  HTN HLD PSVT   Significant Hospital Events: Including procedures, antibiotic start and stop dates in addition to other pertinent events   01/9: Pt admitted to the progressive care unit with SVT and influenza A, however remained in the ER pending bed availability.  While in the ER pt had a syncopal episode x2  01/10: GI consulted for worsening anemia, syncope.  Pt had another witnessed syncopal episode with brief episode of unresponsive and became pale/diaphoretic/hypotensive Code Blue initiated.  Noted to have acute GI bleed with maroon colored stools.  GI aware recommended CT Angiogram 05/20/22-  patient is off vasopressors and has plan for EGD today. He is starting to urinate and we will remove foley after EGD today.    Objective   Blood pressure 123/75, pulse 81, temperature 97.7 F (36.5 C), temperature source Oral, resp. rate (!) 22, height '5\' 9"'$  (1.753 m), weight 78.8 kg, SpO2 97 %.    Vent Mode: PSV;CPAP FiO2 (%):  [35 %] 35 % PEEP:  [5 cmH20] 5 cmH20 Pressure Support:  [5 cmH20] 5 cmH20   Intake/Output Summary (Last 24 hours) at 05/20/2022 4098 Last data filed at 05/20/2022 0800 Gross per 24 hour  Intake 4281.32 ml  Output 975 ml  Net 3306.32 ml    Filed Weights   05/17/22 1836 05/18/22 1700 05/20/22 0500  Weight: 79.4 kg 79.2 kg 78.8 kg    Examination: General: Acutely-ill pale appearing elderly male, with active GI bleed  HENT: Supple, no JVD Lungs: Clear throughout, even, non labored  Cardiovascular: SVT, no m/r/g, 2+ radial/2+ distal pulses, no edema  Abdomen: +BS x4, soft, non tender, non distended  Extremities: Normal bulk and tone, moves all extremities  Neuro: Alert and oriented, follows commands, PERRLA GU: Deferred   Resolved Hospital Problem list     Assessment & Plan:     Hypovolemic shock due to Acute blood loss anemia with GI bleed.  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 Patient received 2 units of pRBC's & 3 L IVF bolus - f/u CBC, fibrinogen, apTT, INR, vitamin B12, celiac panel- per GI recs -  STAT CT angio GIB ordered - LR infusion @ 100 mL/h - phenylephrine drip PRN to maintain MAP > 65 - Octreotide stopped per GI recommendation as no signs of cirrhosis on previous CT imaging - initiate Protonix bolus followed by infusion - Maintain up to date type & screen, stay 2 units ahead, administer plasma - continuous cardiac monitoring - NPO - Monitor for s/s of bleeding - Daily CBC, PT/ INR monitoring PRN - Transfuse for Hgb <8 - GI consulted, appreciate input  Acute on chronic SVT in the setting of  ABLA Elevated Troponin secondary to demand ischemia Patient received adenosine in the ED. Per last cardiology note patient was not interested in previously offered ablation. Echo 05/18/22: LVEF 65-70%, G1DD, mild dilatation in aorta & aortic root - continue pravastatin - hold outpatient regimen due to hypotension and ABLA, consider restarting as patient stabilizes >> metoprolol - continuous cardiac monitoring - consider cardiology consultation if HR does not improve post potential GIB interventions  PMHx: HLD, HTN Influenza A infection - supplemental O2 PRN to maintain SpO2 > 90% - dyspnea began 5-6 days ago, out of the window for Tamiflu - supportive care, CXR negative for pneumonia  Daily ETOH use Patient stated his last drink was 2 weeks ago - consider CIWA if patient shows signs of DT's, but he should be outside the window at this point - monitor electrolytes closely, daily BMP, Mg, Phos- replace PRN - supportive care  Best Practice (right click and "Reselect all SmartList Selections" daily)  Diet/type: NPO DVT prophylaxis: SCD GI prophylaxis: PPI Lines: N/A Foley:  N/A Code Status:  full code Last date of multidisciplinary goals of care discussion [05/18/22]  Labs   CBC: Recent Labs  Lab 05/17/22 1847 05/18/22 0405 05/18/22 1623 05/18/22 2037 05/19/22 0107 05/19/22 0458 05/19/22 1117 05/19/22 1859 05/20/22 0053 05/20/22 0522  WBC 8.9 5.6 7.9 7.7  --  11.5*  --   --   --  4.6  NEUTROABS 7.3  --   --   --   --   --   --   --   --  3.5  HGB 9.8* 8.5* 7.1* 7.4*   < > 8.6* 7.8* 7.1* 8.7* 8.6*  HCT 30.8* 27.0* 22.8* 22.8*   < > 26.1* 23.7* 21.3* 25.5* 26.0*  MCV 96.3 96.8 97.0 88.4  --  88.2  --   --   --  89.3  PLT 255 222 192 191  --  215  --   --   --  102*   < > = values in this interval not displayed.     Basic Metabolic Panel: Recent Labs  Lab 05/18/22 0405 05/18/22 1623 05/18/22 2036 05/18/22 2037 05/19/22 0458 05/20/22 0522  NA 135 135  --  135 138  139  K 4.1 4.3  --  4.2 4.7 4.0  CL 105 109  --  107 109 110  CO2 24 20*  --  14* 22 23  GLUCOSE 115* 175*  --  175* 185* 105*  BUN 31* 37*  --  41* 45* 31*  CREATININE 0.70 0.76  --  0.82 0.97 0.71  CALCIUM 7.4* 7.0*  --  6.9* 7.5* 7.3*  MG  --   --  1.7  --  2.4 2.1  PHOS  --   --  3.0  --  4.6 2.5    GFR: Estimated Creatinine Clearance: 81 mL/min (by C-G formula based on SCr of 0.71 mg/dL). Recent Labs  Lab 05/18/22  1623 05/18/22 2036 05/18/22 2037 05/19/22 0107 05/19/22 0458 05/19/22 0500 05/19/22 1117 05/19/22 1615 05/19/22 1859 05/20/22 0522  WBC 7.9  --  7.7  --  11.5*  --   --   --   --  4.6  LATICACIDVEN  --    < >  --    < >  --    < > 5.5* 5.6* 4.4* 1.4   < > = values in this interval not displayed.     Liver Function Tests: Recent Labs  Lab 05/17/22 1847 05/19/22 0458  AST 37 34  ALT 23 19  ALKPHOS 57 34*  BILITOT 0.4 0.3  PROT 6.3* 4.2*  ALBUMIN 3.1* 2.1*    No results for input(s): "LIPASE", "AMYLASE" in the last 168 hours. No results for input(s): "AMMONIA" in the last 168 hours.  ABG    Component Value Date/Time   PHART 7.4 05/18/2022 2135   PCO2ART 31 (L) 05/18/2022 2135   PO2ART 163 (H) 05/18/2022 2135   HCO3 23.6 05/19/2022 2146   ACIDBASEDEF 4.5 (H) 05/18/2022 2135   O2SAT 86.7 05/19/2022 2146     Coagulation Profile: Recent Labs  Lab 05/18/22 2036  INR 1.4*    Cardiac Enzymes: No results for input(s): "CKTOTAL", "CKMB", "CKMBINDEX", "TROPONINI" in the last 168 hours.  HbA1C: No results found for: "HGBA1C"  CBG: Recent Labs  Lab 05/19/22 1135 05/19/22 1620 05/19/22 1944 05/19/22 2314 05/20/22 0310  GLUCAP 148* 150* 138* 124* 105*     Review of Systems: Positives in BOLD  Gen: Denies fever, chills, weight change, fatigue, night sweats HEENT: Denies blurred vision, double vision, hearing loss, tinnitus, sinus congestion, rhinorrhea, sore throat, neck stiffness, dysphagia PULM: Denies shortness of breath, cough,  sputum production, hemoptysis, wheezing CV: Denies chest pain, edema, orthopnea, paroxysmal nocturnal dyspnea, palpitations GI: Denies abdominal pain, nausea, vomiting, diarrhea, hematochezia, melena, constipation, change in bowel habits GU: Denies dysuria, hematuria, polyuria, oliguria, urethral discharge Endocrine: Denies hot or cold intolerance, polyuria, polyphagia or appetite change Derm: Denies rash, dry skin, scaling or peeling skin change Heme: Denies easy bruising, bleeding, bleeding gums Neuro: Denies headache, numbness, weakness, slurred speech, loss of memory or consciousness  Past Medical History:  He,  has a past medical history of HLD (hyperlipidemia), HTN (hypertension), and PSVT (paroxysmal supraventricular tachycardia).   Surgical History:   Past Surgical History:  Procedure Laterality Date   CATARACT EXTRACTION, BILATERAL  2010   ESOPHAGOGASTRODUODENOSCOPY N/A 05/18/2022   Procedure: ESOPHAGOGASTRODUODENOSCOPY (EGD);  Surgeon: Lin Landsman, MD;  Location: [facility] ENDOSCOPY;  Service: Gastroenterology;  Laterality: N/A;     Social History:   reports that he has quit smoking. He has never used smokeless tobacco. He reports current alcohol use of about 7.0 standard drinks of alcohol per week. He reports that he does not use drugs.   Family History:  His family history includes Dementia in his mother; Emphysema in his father; Hypertension in his mother; Prostate cancer in his father.   Allergies No Known Allergies   Home Medications  Prior to Admission medications   Medication Sig Start Date End Date Taking? Authorizing Provider  metoprolol succinate (TOPROL-XL) 50 MG 24 hr tablet Take 50 mg by mouth daily. 04/27/22 04/27/23 Yes [provider]  pravastatin (PRAVACHOL) 40 MG tablet Take 40 mg by mouth at bedtime. 04/27/22  Yes [provider]  nitroGLYCERIN (NITROSTAT) 0.4 MG SL tablet Place 0.4 mg under the tongue every 5 (five) minutes as  needed for chest pain (May  take up to 3 doses.).    [provider]     Critical care provider statement:   Total critical care time: 33 minutes   Performed by: Lanney Gins MD   Critical care time was exclusive of separately billable procedures and treating other patients.   Critical care was necessary to treat or prevent imminent or life-threatening deterioration.   Critical care was time spent personally by me on the following activities: development of treatment plan with patient and/or surrogate as well as nursing, discussions with consultants, evaluation of patient's response to treatment, examination of patient, obtaining history from patient or surrogate, ordering and performing treatments and interventions, ordering and review of laboratory studies, ordering and review of radiographic studies, pulse oximetry and re-evaluation of patient's condition.    Ottie Glazier, M.D.  Pulmonary & Critical Care Medicine

## 2022-05-20 NOTE — Progress Notes (Signed)
Nutrition Follow-up  DOCUMENTATION CODES:   Not applicable  INTERVENTION:   -Boost Breeze po TID, each supplement provides 250 kcal and 9 grams of protein  -MVI with minerals daily -RD will follow for diet advancement and add supplements as appropriate -Consider short term TPN until g-tube is placed -Once g-tube is placed:  Initiate Osmolite 1.5 @ 20 ml/hr and increase by 10 ml every 4 hours to goal rate of 55 ml/hr.   60 ml Prosource TF daily  150 ml free water flush every 4 hours  Tube feeding regimen provides 2060 kcal (100% of needs), 103 grams of protein, and 1006 ml of H2O. Total free water: 1906 ml daily    -Monitor Mg, K, and Phos daily and replete as needed due to high refeeding risk  NUTRITION DIAGNOSIS:   Inadequate oral intake related to dysphagia as evidenced by NPO status.  Ongoing  GOAL:   Patient will meet greater than or equal to 90% of their needs  Progressing   MONITOR:   PO intake, Diet advancement  REASON FOR ASSESSMENT:   Ventilator    ASSESSMENT:   77 y.o. male with a history of PSVT, HTN, HLD, IDA, etoh abuse and colon cancer s/p robot-assisted laparoscopic right partial colectomy 08/21/20 who is admitted with esophageal dysphagia, anemia, GIB and flu A. 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  1/12- s/p EGD with biopsies of esophageal mass taken  Reviewed I/O's: +3.4 L x 24 hours and +7.8 L since admission  UOP: 975 ml x 24 hours   Case discussed with RN, MD, and during ICU rounds. Plan for EGD today. Per MD, swallow evaluation to be obtained.   Case discussed with SLP. Due to pt's esophageal mass, pt will be unable to consume adequate nutrition and will likely be placed on a full liquid diet at best. Plan for clear liquid diet today.   Per SLP, both SLP and PCCM NP discussed possible g-tube placement with pt and wife. Pt still a little  overwhelmed by decision, but is leaning toward this and IR consult for g-tube placement has been placed. NP also agreeable to potentially starting TPN in the interim until g-tube can be placed to help augment nutrition.   Medications reviewed and include folic acid, thiamine, 0.8% sodium chloride infusion @ 50 ml/hr, and lactated ringers infusion @ 100 ml/hr.   Labs reviewed: CBGS: 98-99.   Diet Order:   Diet Order             Diet clear liquid Room service appropriate? Yes with Assist; Fluid consistency: Thin  Diet effective now                   EDUCATION NEEDS:   Not appropriate for education at this time  Skin:  Skin Assessment: Reviewed RN Assessment  Last BM:  05/19/22 (type 7)  Height:   Ht Readings from Last 1 Encounters:  05/18/22 '5\' 9"'$  (1.753 m)    Weight:   Wt Readings from Last 1 Encounters:  05/20/22 78.8 kg    Ideal Body Weight:  72.7 kg  BMI:  Body mass index is 25.65 kg/m.  Estimated Nutritional Needs:   Kcal:  1900-2200kcal/day  Protein:  95-110g/day  Fluid:  1.9-2.2L/day    Loistine Chance, RD, LDN, [place] Registered Dietitian II Certified Diabetes Care and Education Specialist Please refer to [facility] for RD and/or RD on-call/weekend/after hours pager

## 2022-05-21 DIAGNOSIS — D696 Thrombocytopenia, unspecified: Secondary | ICD-10-CM

## 2022-05-21 DIAGNOSIS — R7989 Other specified abnormal findings of blood chemistry: Secondary | ICD-10-CM | POA: Diagnosis not present

## 2022-05-21 DIAGNOSIS — D509 Iron deficiency anemia, unspecified: Secondary | ICD-10-CM | POA: Diagnosis not present

## 2022-05-21 DIAGNOSIS — D62 Acute posthemorrhagic anemia: Secondary | ICD-10-CM | POA: Diagnosis not present

## 2022-05-21 DIAGNOSIS — K2289 Other specified disease of esophagus: Secondary | ICD-10-CM

## 2022-05-21 DIAGNOSIS — I959 Hypotension, unspecified: Secondary | ICD-10-CM

## 2022-05-21 LAB — CBC
HCT: 26.3 % — ABNORMAL LOW (ref 39.0–52.0)
Hemoglobin: 8.6 g/dL — ABNORMAL LOW (ref 13.0–17.0)
MCH: 29.7 pg (ref 26.0–34.0)
MCHC: 32.7 g/dL (ref 30.0–36.0)
MCV: 90.7 fL (ref 80.0–100.0)
Platelets: 99 10*3/uL — ABNORMAL LOW (ref 150–400)
RBC: 2.9 MIL/uL — ABNORMAL LOW (ref 4.22–5.81)
RDW: 14.9 % (ref 11.5–15.5)
WBC: 6 10*3/uL (ref 4.0–10.5)
nRBC: 0 % (ref 0.0–0.2)

## 2022-05-21 LAB — RENAL FUNCTION PANEL
Albumin: 2.2 g/dL — ABNORMAL LOW (ref 3.5–5.0)
Anion gap: 8 (ref 5–15)
BUN: 17 mg/dL (ref 8–23)
CO2: 22 mmol/L (ref 22–32)
Calcium: 7.2 mg/dL — ABNORMAL LOW (ref 8.9–10.3)
Chloride: 106 mmol/L (ref 98–111)
Creatinine, Ser: 0.59 mg/dL — ABNORMAL LOW (ref 0.61–1.24)
GFR, Estimated: >60 mL/min (ref >60)
Glucose, Bld: 93 mg/dL (ref 70–99)
Phosphorus: 2.3 mg/dL — ABNORMAL LOW (ref 2.5–4.6)
Potassium: 3.8 mmol/L (ref 3.5–5.1)
Sodium: 136 mmol/L (ref 135–145)

## 2022-05-21 LAB — GLUCOSE, CAPILLARY
Glucose-Capillary: 97 mg/dL (ref 70–99)
Glucose-Capillary: 82 mg/dL (ref 70–99)
Glucose-Capillary: 99 mg/dL (ref 70–99)
Glucose-Capillary: 96 mg/dL (ref 70–99)
Glucose-Capillary: 89 mg/dL (ref 70–99)

## 2022-05-21 LAB — HEMOGLOBIN AND HEMATOCRIT, BLOOD
HCT: 30.7 % — ABNORMAL LOW (ref 39.0–52.0)
Hemoglobin: 10.3 g/dL — ABNORMAL LOW (ref 13.0–17.0)

## 2022-05-21 NOTE — Progress Notes (Signed)
PROGRESS NOTE  Bobby Moreno GEX:[id number] DOB: 11/13/1945   PCP: Zeb Comfort, MD  Patient is from: Home.  Lives with his wife.  DOA: 05/17/2022 LOS: 4  Chief complaints Chief Complaint  Patient presents with   Palpitations     Brief Narrative / Interim history: 77 year old M with PMH of paroxysmal SVT, HTN and anemia presented to [facility] ER by EMS with palpitation.  Upon EMS arrival, patient was in SVT and was hypotensive to 70/40.  DVT did not resolve with vagal maneuver but aborted by 6 mg of adenosine followed by 12 mg of adenosine.  He converted to NSR.  He tested positive for influenza A in ED.  Patient had recurrent syncope while in ED with diaphoresis and hypotension.  CODE BLUE initiated on 1/10.  Noted to have acute GI bleed with maroon-colored stools.  GI consulted.  Transfused blood.  CT angio showed active bleeding in proximal stomach near the gastroesophageal junction with moderate distention of the stomach.  CT angio also showed new indeterminate rim-enhancing lesion in the left lobe of the liver.  Patient was mechanically intubated for EGD.  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  He had repeat EGD with biopsy on 1/12.  Eventually, he was extubated and transferred to [facility] hospital[facility] service on 1/13. Pathology pending.  Subjective: Seen and examined earlier this morning.  No major events overnight of this morning.   Patient's wife at bedside.  Reports feeling weak and tired and confused at times from being in the hospital following.  Denies shortness of breath, GI or UTI symptoms.  Wondering about pathology result and next step.  Pathology results pending.  Objective: Vitals:   05/21/22 1300 05/21/22 1400 05/21/22 1500 05/21/22 1600  BP: (!) 142/70 123/72 (!) 160/78 (!) 131/59  Pulse: 65 66 82 67  Resp: (!) 23 (!) 22 (!) 23 (!) 23  Temp:  97.9 F (36.6 C)    TempSrc:  Oral    SpO2: 93% 91% 92% 93%  Weight:       Height:        Examination:  GENERAL: No apparent distress.  Nontoxic. HEENT: MMM.  Vision and hearing grossly intact.  NECK: Supple.  No apparent JVD.  RESP:  No IWOB.  Fair aeration bilaterally. CVS:  RRR. Heart sounds normal.  ABD/GI/GU: BS+. Abd soft, NTND.  MSK/EXT:  Moves extremities. No apparent deformity. No edema.  SKIN: no apparent skin lesion or wound NEURO: Awake, alert and oriented appropriately.  No apparent focal neuro deficit. PSYCH: Calm. Normal affect.   Procedures:  1/10-EGD showed a partially obstructing esophageal tumor at the gastroesophageal junction. 1/12-EGD showed partially obstructing esophageal trauma and the lower third of esophagus.  Biopsied. 1/10-1/12-intubation and mechanical ventilation.  Microbiology summarized: 1/9-influenza A PCR positive. 1/9-COVID-19, influenza B and RSV PCR nonreactive. 1/9-MRSA PCR screen negative.  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  CT angio was concerning for bleeding from gastroesophageal junction likely from esophageal tumor in the lower third of esophagus as noted on EGD.  Hemostatic spray applied on 1/10.  Anemia panel with iron deficiency.  H&H stable. Recent Labs    05/18/22 2037 05/19/22 0107 05/19/22 0458 05/19/22 1117 05/19/22 1859 05/20/22 0053 05/20/22 0522 05/20/22 1125 05/20/22 1727 05/21/22 0518  HGB 7.4* 8.5* 8.6* 7.8* 7.1* 8.7* 8.6* 9.4* 8.8* 8.6*   -Monitor H&H -Continue Protonix drip.  Transition to IV Protonix 40 mg twice daily tomorrow -GI signed off  Esophageal tumor: Noted on EGD.  Biopsy taken on 1/12. -Follow-up pathology -Oncology consult after pathology result -Continue IV Unasyn per PCCM  Esophageal dysphagia: Likely due to esophageal tumor. -On clear liquid diet. -Continue IV fluid.  Paroxysmal SVT/new onset A-fib/syncope: Found to be in SVT when EMS arrived.  Likely provoked.  Required adenosine 6 mg followed by 12 mg.  TTE with LVEF of 65 to 70%, G1 DD but no RWMA -Cardiology following -Continue metoprolol 12.5 mg twice daily  Influenza A infection -Was not started on Tamiflu.  Essential hypertension -Continue metoprolol 12.5 mg twice daily  Thrombocytopenia: Stable. -Continue monitoring  Physical deconditioning -PT/OT eval   Body mass index is 25.65 kg/m.  Inadequate oral intake Nutrition Problem: Inadequate oral intake Etiology: dysphagia Signs/Symptoms: NPO status Interventions: Refer to RD note for recommendations   DVT prophylaxis:  Place and maintain sequential compression device Start: 05/18/22 1027 Place TED hose Start: 05/17/22 2156  Code Status: Full code Family Communication: Updated patient's wife at bedside Level of care: Progressive Status is: Inpatient Remains inpatient appropriate because: Esophageal tumor/dysphagia   Final disposition: Likely home once medically stable Consultants:  Cardiology Pulmonology Gastroenterology  55 minutes with more than 50% spent in reviewing records, counseling patient/family and coordinating care.   Sch Meds:  Scheduled Meds:  Chlorhexidine Gluconate Cloth  6 each Topical Daily   feeding supplement  1 Container Oral TID BM   folic acid  1 mg Intravenous Daily   metoprolol tartrate  12.5 mg Oral BID   multivitamin with minerals  1 tablet Oral Daily   [START ON 05/22/2022] pantoprazole  40 mg Intravenous Q12H   pravastatin  40 mg Per  Tube QHS   sodium chloride flush  10-40 mL Intracatheter Q12H   thiamine (VITAMIN B1) injection  100 mg Intravenous Daily   Continuous Infusions:  sodium chloride Stopped (05/21/22 1657)   ampicillin-sulbactam (UNASYN) IV 200 mL/hr at 05/21/22 1658   lactated ringers 100 mL/hr at 05/21/22 1658   pantoprazole 8 mg/hr (05/21/22 1658)   PRN Meds:.acetaminophen **OR** acetaminophen, ondansetron **OR** ondansetron (ZOFRAN) IV, mouth rinse, senna-docusate, sodium chloride flush  Antimicrobials: Anti-infectives (From admission, onward)    Start     Dose/Rate Route Frequency Ordered Stop   05/20/22 2200  Ampicillin-Sulbactam (UNASYN) 3 g in sodium chloride 0.9 % 100 mL IVPB        3 g 200 mL/hr over 30 Minutes Intravenous Every 6 hours 05/20/22 1022 05/24/22 2159   05/19/22 2200  cefTRIAXone (ROCEPHIN) 1 g in sodium chloride 0.9 % 100 mL IVPB  Status:  Discontinued        1 g 200 mL/hr over 30 Minutes Intravenous Every 12 hours 05/19/22 1901 05/19/22 1901   05/19/22 2000  cefTRIAXone (ROCEPHIN) 2 g in sodium chloride 0.9 % 100 mL IVPB  Status:  Discontinued        2 g 200 mL/hr over 30 Minutes  Intravenous Every 24 hours 05/19/22 1901 05/20/22 1022        I have personally reviewed the following labs and images: CBC: Recent Labs  Lab 05/17/22 1847 05/18/22 0405 05/18/22 1623 05/18/22 2037 05/19/22 0107 05/19/22 0458 05/19/22 1117 05/20/22 0053 05/20/22 0522 05/20/22 1125 05/20/22 1727 05/21/22 0518  WBC 8.9   < > 7.9 7.7  --  11.5*  --   --  4.6  --   --  6.0  NEUTROABS 7.3  --   --   --   --   --   --   --  3.5  --   --   --   HGB 9.8*   < > 7.1* 7.4*   < > 8.6*   < > 8.7* 8.6* 9.4* 8.8* 8.6*  HCT 30.8*   < > 22.8* 22.8*   < > 26.1*   < > 25.5* 26.0* 27.3* 26.1* 26.3*  MCV 96.3   < > 97.0 88.4  --  88.2  --   --  89.3  --   --  90.7  PLT 255   < > 192 191  --  215  --   --  102*  --   --  99*   < > = values in this interval not displayed.   BMP &GFR Recent Labs  Lab  05/18/22 1623 05/18/22 2036 05/18/22 2037 05/19/22 0458 05/20/22 0522 05/21/22 0518  NA 135  --  135 138 139 136  K 4.3  --  4.2 4.7 4.0 3.8  CL 109  --  107 109 110 106  CO2 20*  --  14* '22 23 22  '$ GLUCOSE 175*  --  175* 185* 105* 93  BUN 37*  --  41* 45* 31* 17  CREATININE 0.76  --  0.82 0.97 0.71 0.59*  CALCIUM 7.0*  --  6.9* 7.5* 7.3* 7.2*  MG  --  1.7  --  2.4 2.1  --   PHOS  --  3.0  --  4.6 2.5 2.3*   Estimated Creatinine Clearance: 81 mL/min (A) (by C-G formula based on SCr of 0.59 mg/dL (L)). Liver & Pancreas: Recent Labs  Lab 05/17/22 1847 05/19/22 0458 05/21/22 0518  AST 37 34  --   ALT 23 19  --   ALKPHOS 57 34*  --   BILITOT 0.4 0.3  --   PROT 6.3* 4.2*  --   ALBUMIN 3.1* 2.1* 2.2*   No results for input(s): "LIPASE", "AMYLASE" in the last 168 hours. No results for input(s): "AMMONIA" in the last 168 hours. Diabetic: No results for input(s): "HGBA1C" in the last 72 hours. Recent Labs  Lab 05/20/22 2345 05/21/22 0330 05/21/22 0716 05/21/22 1151 05/21/22 1531  GLUCAP 93 97 82 99 96   Cardiac Enzymes: No results for input(s): "CKTOTAL", "CKMB", "CKMBINDEX", "TROPONINI" in the last 168 hours. No results for input(s): "PROBNP" in the last 8760 hours. Coagulation Profile: Recent Labs  Lab 05/18/22 2036  INR 1.4*   Thyroid Function Tests: No results for input(s): "TSH", "T4TOTAL", "FREET4", "T3FREE", "THYROIDAB" in the last 72 hours. Lipid Profile: Recent Labs    05/19/22 0500  TRIG 138   Anemia Panel: Recent Labs    05/18/22 2037  VITAMINB12 1,156*   Urine analysis:    Component Value Date/Time   COLORURINE YELLOW (A) 05/18/2022 2135   APPEARANCEUR HAZY (A) 05/18/2022 2135   LABSPEC >1.046 (H) 05/18/2022 2135   PHURINE 5.0 05/18/2022 2135  GLUCOSEU NEGATIVE 05/18/2022 2135   HGBUR LARGE (A) 05/18/2022 2135   BILIRUBINUR NEGATIVE 05/18/2022 2135   [place] NEGATIVE 05/18/2022 2135   PROTEINUR 100 (A) 05/18/2022 2135   NITRITE  NEGATIVE 05/18/2022 2135   LEUKOCYTESUR NEGATIVE 05/18/2022 2135   Sepsis Labs: Invalid input(s): "PROCALCITONIN", "LACTICIDVEN"  Microbiology: Recent Results (from the past 240 hour(s))  Resp panel by RT-PCR (RSV, Flu A&B, Covid) Anterior Nasal Swab     Status: Abnormal   Collection Time: 05/17/22  7:53 PM   Specimen: Anterior Nasal Swab  Result Value Ref Range Status   SARS Coronavirus 2 by RT PCR NEGATIVE NEGATIVE Final    Comment: (NOTE) SARS-CoV-2 target nucleic acids are NOT DETECTED.  The SARS-CoV-2 RNA is generally detectable in upper respiratory specimens during the acute phase of infection. The lowest concentration of SARS-CoV-2 viral copies this assay can detect is 138 copies/mL. A negative result does not preclude SARS-Cov-2 infection and should not be used as the sole basis for treatment or other patient management decisions. A negative result may occur with  improper specimen collection/handling, submission of specimen other than nasopharyngeal swab, presence of viral mutation(s) within the areas targeted by this assay, and inadequate number of viral copies(<138 copies/mL). A negative result must be combined with clinical observations, patient history, and epidemiological information. The expected result is Negative.  Fact Sheet for Patients:  EntrepreneurPulse.com.au  Fact Sheet for Healthcare Providers:  IncredibleEmployment.be  This test is no t yet approved or cleared by the [place] FDA and  has been authorized for detection and/or diagnosis of SARS-CoV-2 by FDA under an Emergency Use Authorization (EUA). This EUA will remain  in effect (meaning this test can be used) for the duration of the COVID-19 declaration under Section 564(b)(1) of the Act, 21 U.S.C.section 360bbb-3(b)(1), unless the authorization is terminated  or revoked sooner.       Influenza A by PCR POSITIVE (A) NEGATIVE Final   Influenza B by PCR  NEGATIVE NEGATIVE Final    Comment: (NOTE) The Xpert Xpress SARS-CoV-2/FLU/RSV plus assay is intended as an aid in the diagnosis of influenza from Nasopharyngeal swab specimens and should not be used as a sole basis for treatment. Nasal washings and aspirates are unacceptable for Xpert Xpress SARS-CoV-2/FLU/RSV testing.  Fact Sheet for Patients: EntrepreneurPulse.com.au  Fact Sheet for Healthcare Providers: IncredibleEmployment.be  This test is not yet approved or cleared by the [place] FDA and has been authorized for detection and/or diagnosis of SARS-CoV-2 by FDA under an Emergency Use Authorization (EUA). This EUA will remain in effect (meaning this test can be used) for the duration of the COVID-19 declaration under Section 564(b)(1) of the Act, 21 U.S.C. section 360bbb-3(b)(1), unless the authorization is terminated or revoked.     Resp Syncytial Virus by PCR NEGATIVE NEGATIVE Final    Comment: (NOTE) Fact Sheet for Patients: EntrepreneurPulse.com.au  Fact Sheet for Healthcare Providers: IncredibleEmployment.be  This test is not yet approved or cleared by the [place] FDA and has been authorized for detection and/or diagnosis of SARS-CoV-2 by FDA under an Emergency Use Authorization (EUA). This EUA will remain in effect (meaning this test can be used) for the duration of the COVID-19 declaration under Section 564(b)(1) of the Act, 21 U.S.C. section 360bbb-3(b)(1), unless the authorization is terminated or revoked.  Performed at [facility], [address] [place], [place] [address]   MRSA Next Gen by PCR, Nasal     Status: None   Collection Time: 05/18/22  5:16  PM   Specimen: Nasal Mucosa; Nasal Swab  Result Value Ref Range Status   MRSA by PCR Next Gen NOT DETECTED NOT DETECTED Final    Comment: (NOTE) The GeneXpert MRSA Assay (FDA approved for NASAL specimens only), is  one component of a comprehensive MRSA colonization surveillance program. It is not intended to diagnose MRSA infection nor to guide or monitor treatment for MRSA infections. Test performance is not FDA approved in patients less than 75 years old. Performed at [facility], [address], [place], [place] [address]     Radiology Studies: No results found.    [place] T. [place]  If 7PM-7AM, please contact night-coverage [email or web address] 05/21/2022, 5:11 PM

## 2022-05-21 NOTE — Progress Notes (Signed)
[facility]    SUBJECTIVE: Patient states she feels reasonably well no evidence of bleeding no palpitations or tachycardia no chest pain sitting up in bed feels relatively back to normal   Vitals:   05/21/22 1100 05/21/22 1200 05/21/22 1300 05/21/22 1400  BP: (!) 151/83 (!) 145/75 (!) 142/70 123/72  Pulse: 83 63 65 66  Resp: (!) 21 (!) 24 (!) 23 (!) 22  Temp:    97.9 F (36.6 C)  TempSrc:    Oral  SpO2: 92% 92% 93% 91%  Weight:      Height:         Intake/Output Summary (Last 24 hours) at 05/21/2022 1454 Last data filed at 05/21/2022 1400 Gross per 24 hour  Intake 3218.32 ml  Output 2750 ml  Net 468.32 ml      PHYSICAL EXAM  General: Well developed, well nourished, in no acute distress HEENT:  Normocephalic and atramatic Neck:  No JVD.  Lungs: Clear bilaterally to auscultation and percussion. Heart: HRRR . Normal S1 and S2 without gallops or murmurs.  Abdomen: Bowel sounds are positive, abdomen soft and non-tender  Msk:  Back normal, normal gait. Normal strength and tone for age. Extremities: No clubbing, cyanosis or edema.   Neuro: Alert and oriented X 3. Psych:  Good affect, responds appropriately   LABS: Basic Metabolic Panel: Recent Labs    05/19/22 0458 05/20/22 0522 05/21/22 0518  NA 138 139 136  K 4.7 4.0 3.8  CL 109 110 106  CO2 '22 23 22  '$ GLUCOSE 185* 105* 93  BUN 45* 31* 17  CREATININE 0.97 0.71 0.59*  CALCIUM 7.5* 7.3* 7.2*  MG 2.4 2.1  --   PHOS 4.6 2.5 2.3*   Liver Function Tests: Recent Labs    05/19/22 0458 05/21/22 0518  AST 34  --   ALT 19  --   ALKPHOS 34*  --   BILITOT 0.3  --   PROT 4.2*  --   ALBUMIN 2.1* 2.2*   No results for input(s): "LIPASE", "AMYLASE" in the last 72 hours. CBC: Recent Labs    05/20/22 0522 05/20/22 1125 05/20/22 1727 05/21/22 0518  WBC 4.6  --   --  6.0  NEUTROABS 3.5  --   --   --   HGB 8.6*   < > 8.8* 8.6*  HCT 26.0*   < > 26.1* 26.3*  MCV 89.3  --   --  90.7  PLT 102*  --   --  99*    < > = values in this interval not displayed.   Cardiac Enzymes: No results for input(s): "CKTOTAL", "CKMB", "CKMBINDEX", "TROPONINI" in the last 72 hours. BNP: Invalid input(s): "POCBNP" D-Dimer: No results for input(s): "DDIMER" in the last 72 hours. Hemoglobin A1C: No results for input(s): "HGBA1C" in the last 72 hours. Fasting Lipid Panel: Recent Labs    05/19/22 0500  TRIG 138   Thyroid Function Tests: No results for input(s): "TSH", "T4TOTAL", "T3FREE", "THYROIDAB" in the last 72 hours.  Invalid input(s): "FREET3" Anemia Panel: Recent Labs    05/18/22 2037  VITAMINB12 1,156*    No results found.   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)   Esophageal mass    Plan History of paroxysmal SVT appears to be stable continue conservative therapy patient currently in sinus rhythm maintained on metoprolol therapy Anemia with blood loss agree with blood transfusions to maintain hemoglobin of 8 or 9 Hypotension much improved continue supportive management Influenza A lung infection continue supportive care and management as per pulmonary Diastolic congestive heart failure stable reasonably compensated continue conservative management New onset atrial fibrillation possibly be related to underlying anemia influenza poor anticoagulation candidate currently because of bleeding is not clear whether this patient will require long-term anticoagulation since the  A-fib may have been provoked by influenza  Yolonda Kida, MD,  05/21/2022 2:54 PM

## 2022-05-21 NOTE — Plan of Care (Signed)

## 2022-05-21 NOTE — Progress Notes (Signed)
Patient transferred from IVU to 256 in stable condition. She denies any acute pain. Will continue to monitor.

## 2022-05-21 NOTE — Progress Notes (Signed)
Pt transported to room 256 on telemetry accompanied by RN and NT. Hand off given at bedside to [facility].

## 2022-05-21 NOTE — Progress Notes (Signed)
Report called to Shauna Hugh RN, pt for transfer to room 256.

## 2022-05-22 DIAGNOSIS — D62 Acute posthemorrhagic anemia: Secondary | ICD-10-CM | POA: Diagnosis not present

## 2022-05-22 DIAGNOSIS — K2289 Other specified disease of esophagus: Secondary | ICD-10-CM | POA: Diagnosis not present

## 2022-05-22 DIAGNOSIS — D509 Iron deficiency anemia, unspecified: Secondary | ICD-10-CM | POA: Diagnosis not present

## 2022-05-22 DIAGNOSIS — R7989 Other specified abnormal findings of blood chemistry: Secondary | ICD-10-CM | POA: Diagnosis not present

## 2022-05-22 LAB — RENAL FUNCTION PANEL
Albumin: 2.5 g/dL — ABNORMAL LOW (ref 3.5–5.0)
Anion gap: 7 (ref 5–15)
BUN: 13 mg/dL (ref 8–23)
CO2: 21 mmol/L — ABNORMAL LOW (ref 22–32)
Calcium: 7.3 mg/dL — ABNORMAL LOW (ref 8.9–10.3)
Chloride: 105 mmol/L (ref 98–111)
Creatinine, Ser: 0.53 mg/dL — ABNORMAL LOW (ref 0.61–1.24)
GFR, Estimated: >60 mL/min (ref >60)
Glucose, Bld: 89 mg/dL (ref 70–99)
Phosphorus: 2.3 mg/dL — ABNORMAL LOW (ref 2.5–4.6)
Potassium: 3.5 mmol/L (ref 3.5–5.1)
Sodium: 133 mmol/L — ABNORMAL LOW (ref 135–145)

## 2022-05-22 LAB — CBC
HCT: 30.8 % — ABNORMAL LOW (ref 39.0–52.0)
Hemoglobin: 10.3 g/dL — ABNORMAL LOW (ref 13.0–17.0)
MCH: 29.3 pg (ref 26.0–34.0)
MCHC: 33.4 g/dL (ref 30.0–36.0)
MCV: 87.7 fL (ref 80.0–100.0)
Platelets: 138 10*3/uL — ABNORMAL LOW (ref 150–400)
RBC: 3.51 MIL/uL — ABNORMAL LOW (ref 4.22–5.81)
RDW: 14.5 % (ref 11.5–15.5)
WBC: 6.9 10*3/uL (ref 4.0–10.5)
nRBC: 0 % (ref 0.0–0.2)

## 2022-05-22 LAB — GLUCOSE, CAPILLARY
Glucose-Capillary: 89 mg/dL (ref 70–99)
Glucose-Capillary: 84 mg/dL (ref 70–99)
Glucose-Capillary: 105 mg/dL — ABNORMAL HIGH (ref 70–99)
Glucose-Capillary: 101 mg/dL — ABNORMAL HIGH (ref 70–99)

## 2022-05-22 LAB — MAGNESIUM: Magnesium: 1.9 mg/dL (ref 1.7–2.4)

## 2022-05-22 MED ORDER — ONDANSETRON HCL 4 MG/2ML IJ SOLN: 4.0000 mg | Freq: Three times a day (TID) | INTRAMUSCULAR | Status: DC | PRN

## 2022-05-22 MED ORDER — POTASSIUM PHOSPHATES 15 MMOLE/5ML IV SOLN
15.0000 mmol | Freq: Once | INTRAVENOUS | Status: AC
  Administered 2022-05-22: 15 mmol via INTRAVENOUS
  Filled 2022-05-22: qty 5

## 2022-05-22 MED ORDER — ACETAMINOPHEN 325 MG PO TABS: 650.0000 mg | ORAL_TABLET | Freq: Four times a day (QID) | ORAL | Status: DC | PRN

## 2022-05-22 NOTE — Progress Notes (Signed)
[facility] Card[facility]    SUBJECTIVE: Patient resting comfortably denies any bleeding any shortness of breath any chest pain awaiting pathology report   Vitals:   05/22/22 0036 05/22/22 0500 05/22/22 0539 05/22/22 0700  BP:   (!) 150/80 (!) 160/88  Pulse: 85 88 90 86  Resp: 20 (!) 22 (!) 26 (!) 22  Temp:      TempSrc:      SpO2: 93% 93% 94% 95%  Weight:      Height:         Intake/Output Summary (Last 24 hours) at 05/22/2022 0915 Last data filed at 05/22/2022 0700 Gross per 24 hour  Intake 2532.54 ml  Output 3275 ml  Net -742.46 ml      PHYSICAL EXAM  General: Well developed, well nourished, in no acute distress HEENT:  Normocephalic and atramatic Neck:  No JVD.  Lungs: Clear bilaterally to auscultation and percussion. Heart: HRRR . Normal S1 and S2 without gallops or murmurs.  Abdomen: Bowel sounds are positive, abdomen soft and non-tender  Msk:  Back normal, normal gait. Normal strength and tone for age. Extremities: No clubbing, cyanosis or edema.   Neuro: Alert and oriented X 3. Psych:  Good affect, responds appropriately   LABS: Basic Metabolic Panel: Recent Labs    05/20/22 0522 05/21/22 0518 05/22/22 0512  NA 139 136 133*  K 4.0 3.8 3.5  CL 110 106 105  CO2 23 22 21*  GLUCOSE 105* 93 89  BUN 31* 17 13  CREATININE 0.71 0.59* 0.53*  CALCIUM 7.3* 7.2* 7.3*  MG 2.1  --  1.9  PHOS 2.5 2.3* 2.3*   Liver Function Tests: Recent Labs    05/21/22 0518 05/22/22 0512  ALBUMIN 2.2* 2.5*   No results for input(s): "LIPASE", "AMYLASE" in the last 72 hours. CBC: Recent Labs    05/20/22 0522 05/20/22 1125 05/21/22 0518 05/21/22 1731 05/22/22 0512  WBC 4.6  --  6.0  --  6.9  NEUTROABS 3.5  --   --   --   --   HGB 8.6*   < > 8.6* 10.3* 10.3*  HCT 26.0*   < > 26.3* 30.7* 30.8*  MCV 89.3  --  90.7  --  87.7  PLT 102*  --  99*  --  138*   < > = values in this interval not displayed.   Cardiac Enzymes: No results for input(s): "CKTOTAL", "CKMB",  "CKMBINDEX", "TROPONINI" in the last 72 hours. BNP: Invalid input(s): "POCBNP" D-Dimer: No results for input(s): "DDIMER" in the last 72 hours. Hemoglobin A1C: No results for input(s): "HGBA1C" in the last 72 hours. Fasting Lipid Panel: No results for input(s): "CHOL", "HDL", "LDLCALC", "TRIG", "CHOLHDL", "LDLDIRECT" in the last 72 hours. Thyroid Function Tests: No results for input(s): "TSH", "T4TOTAL", "T3FREE", "THYROIDAB" in the last 72 hours.  Invalid input(s): "FREET3" Anemia Panel: No results for input(s): "VITAMINB12", "FOLATE", "FERRITIN", "TIBC", "IRON", "RETICCTPCT" in the last 72 hours.  No results found.   Echo normal left ventricular function EF around 60-65  TELEMETRY: NSR 90 :  ASSESSMENT AND PLAN:  Principal Problem:   Acute blood loss anemia (ABLA) Active Problems:   HTN (hypertension)   HLD (hyperlipidemia)   Syncope   Thrombocytopenia (HCC)   Iron deficiency anemia   Influenza A   Elevated troponin   Esophageal dysphagia   Symptomatic anemia   Hypotension   Sinus tachycardia by electrocardiogram   Hemorrhagic shock (HCC)   Acute upper GI bleed   Malignant tumor  of lower third of esophagus (HCC)   Esophageal mass    Plan Influenza A upper respiratory infection patient stable improved less shortness of breath/fatigue Upper GI bleeding from esophageal mass bleeding controlled after transfusions malignant tumor upper third of esophagus status postbiopsy workup and treatment as per GI and oncology Hypertension continue current therapy Agree with lipid management Syncope stable continue current therapy GI  s/p posttransfusion H/H stable   Yolonda Kida, MD 05/22/2022 9:15 AM

## 2022-05-22 NOTE — Progress Notes (Signed)
PROGRESS NOTE  Bobby Moreno OBS:[id number] DOB: 02/06/1946   PCP: Zeb Comfort, MD  Patient is from: Home.  Lives with his wife.  DOA: 05/17/2022 LOS: 5  Chief complaints Chief Complaint  Patient presents with   Palpitations     Brief Narrative / Interim history: 77 year old M with PMH of paroxysmal SVT, HTN and anemia presented to [facility] ER by EMS with palpitation.  Upon EMS arrival, patient was in SVT and was hypotensive to 70/40.  DVT did not resolve with vagal maneuver but aborted by 6 mg of adenosine followed by 12 mg of adenosine.  He converted to NSR.  He tested positive for influenza A in ED.  Patient had recurrent syncope while in ED with diaphoresis and hypotension.  CODE BLUE initiated on 1/10.  Noted to have acute GI bleed with maroon-colored stools.  GI consulted.  Transfused blood.  CT angio showed active bleeding in proximal stomach near the gastroesophageal junction with moderate distention of the stomach.  CT angio also showed new indeterminate rim-enhancing lesion in the left lobe of the liver.  Patient was mechanically intubated for EGD.  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  He had repeat EGD with biopsy on 1/12.  Eventually, he was extubated and transferred to [facility] on 1/13. Pathology pending.  Remains on clear liquid diet.   Subjective: Seen and examined earlier this morning.  No major events overnight of this morning.  No complaints but anxious about pathology result from esophageal biopsy.  Patient's wife at bedside.  Later in the morning, patient had a bowel movement with some dark stool which appeared to be old blood.  He has not been bowel movement since his GI bleed on 1/10.  Objective: Vitals:   05/22/22 0539 05/22/22 0700 05/22/22 1042 05/22/22 1229  BP: (!) 150/80 (!) 160/88 (!) 141/80 (!) 144/83  Pulse: 90 86 100 89  Resp: (!) 26 (!) 22  19  Temp:   98.4 F (36.9 C) 98.3 F  (36.8 C)  TempSrc:   Oral Oral  SpO2: 94% 95%  94%  Weight:      Height:        Examination:  GENERAL: No apparent distress.  Nontoxic. HEENT: MMM.  Vision and hearing grossly intact.  NECK: Supple.  No apparent JVD.  RESP:  No IWOB.  Fair aeration bilaterally. CVS:  RRR. Heart sounds normal.  ABD/GI/GU: BS+. Abd soft, NTND.  MSK/EXT:  Moves extremities. No apparent deformity. No edema.  SKIN: no apparent skin lesion or wound NEURO: Awake and alert. Oriented appropriately.  No apparent focal neuro deficit. PSYCH: Calm. Normal affect.   Procedures:  1/10-EGD showed a partially obstructing esophageal tumor at the gastroesophageal junction. 1/12-EGD showed partially obstructing esophageal trauma and the lower third of esophagus.  Biopsied. 1/10-1/12-intubation and mechanical ventilation.  Microbiology summarized: 1/9-influenza A PCR positive. 1/9-COVID-19, influenza B and RSV PCR nonreactive. 1/9-MRSA PCR screen negative.  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  CT angio was concerning for bleeding from gastroesophageal junction likely from esophageal tumor in the lower third of esophagus as noted on EGD.  Hemostatic spray applied on 1/10.  Anemia panel with iron deficiency.  H&H improved likely some degree of hemoconcentration. Recent Labs    05/19/22 0458 05/19/22 1117 05/19/22 1859 05/20/22 0053 05/20/22 0522 05/20/22 1125 05/20/22 1727  05/21/22 0518 05/21/22 1731 05/22/22 0512  HGB 8.6* 7.8* 7.1* 8.7* 8.6* 9.4* 8.8* 8.6* 10.3* 10.3*  -Monitor H&H -IV Protonix 40 mg twice daily -GI signed off  Esophageal tumor: Noted on EGD.  Biopsy taken on 1/12. -Follow-up pathology -Oncology consult after pathology result -Continue IV Unasyn per PCCM  Esophageal dysphagia: Likely due to esophageal tumor. -On clear liquid diet. -Discontinue IV fluid.  Paroxysmal SVT/new onset A-fib/syncope: Found to be in SVT when EMS arrived.  Likely provoked.  Required adenosine 6 mg followed by 12 mg.  TTE with LVEF of 65 to 70%, G1-DD but no RWMA -Cardiology following -Continue metoprolol 12.5 mg twice daily -Optimize electrolytes  Influenza A infection -Was not started on Tamiflu.  Essential hypertension: BP elevated. -Continue metoprolol 12.5 mg twice daily -Discontinue IV fluid  Thrombocytopenia: Stable. -Continue monitoring  Hypokalemia/hypophosphatemia -IV potassium phosphate 15 mmol x 1  Physical deconditioning -PT/OT eval   Body mass index is 25.65 kg/m.  Inadequate oral intake Nutrition Problem: Inadequate oral intake Etiology: dysphagia Signs/Symptoms: NPO status Interventions: Refer to RD note for recommendations   DVT prophylaxis:  Place and maintain sequential compression device Start: 05/18/22 1027 Place TED hose Start: 05/17/22 2156  Code Status: Full code Family Communication: Updated patient's wife at bedside Level of care: Med-Surg Status is: Inpatient Remains inpatient appropriate because: Esophageal tumor/dysphagia   Final disposition: Likely home once medically stable Consultants:  Cardiology Pulmonology Gastroenterology  35 minutes with more than 50% spent in reviewing records, counseling patient/family and coordinating care.   Sch Meds:  Scheduled Meds:  Chlorhexidine Gluconate Cloth  6 each Topical Daily   feeding supplement  1 Container Oral TID BM   folic acid  1 mg Intravenous  Daily   metoprolol tartrate  12.5 mg Oral BID   multivitamin with minerals  1 tablet Oral Daily   pantoprazole  40 mg Intravenous Q12H   pravastatin  40 mg Per Tube QHS   sodium chloride flush  10-40 mL Intracatheter Q12H   thiamine (VITAMIN B1) injection  100 mg Intravenous Daily   Continuous Infusions:  sodium chloride 250 mL (05/21/22 2135)   ampicillin-sulbactam (UNASYN) IV 3 g (05/22/22 1129)   potassium PHOSPHATE IVPB (in mmol) 15 mmol (05/22/22 1128)   PRN Meds:.acetaminophen **OR** acetaminophen, ondansetron **OR** ondansetron (ZOFRAN) IV, mouth rinse, senna-docusate, sodium chloride flush  Antimicrobials: Anti-infectives (From admission, onward)    Start     Dose/Rate Route Frequency Ordered Stop   05/20/22 2200  Ampicillin-Sulbactam (UNASYN) 3 g in sodium chloride 0.9 % 100 mL IVPB        3 g 200 mL/hr over 30 Minutes Intravenous Every 6 hours 05/20/22 1022 05/24/22 2159   05/19/22 2200  cefTRIAXone (ROCEPHIN) 1 g in sodium chloride 0.9 % 100 mL IVPB  Status:  Discontinued        1 g 200 mL/hr over 30 Minutes Intravenous Every 12 hours 05/19/22 1901 05/19/22 1901   05/19/22 2000  cefTRIAXone (ROCEPHIN) 2 g in sodium chloride 0.9 % 100 mL IVPB  Status:  Discontinued        2 g 200 mL/hr over 30 Minutes Intravenous Every 24 hours 05/19/22 1901 05/20/22 1022        I have personally reviewed the following labs and images: CBC: Recent Labs  Lab 05/17/22 1847 05/18/22 0405 05/18/22 2037 05/19/22 0107 05/19/22 0458 05/19/22 1117 05/20/22 0522 05/20/22 1125 05/20/22 1727 05/21/22 0518 05/21/22 1731 05/22/22 0512  WBC 8.9   < > 7.7  --  11.5*  --  4.6  --   --  6.0  --  6.9  NEUTROABS 7.3  --   --   --   --   --  3.5  --   --   --   --   --   HGB 9.8*   < > 7.4*   < > 8.6*   < > 8.6* 9.4* 8.8* 8.6* 10.3* 10.3*  HCT 30.8*   < > 22.8*   < > 26.1*   < > 26.0* 27.3* 26.1* 26.3* 30.7* 30.8*  MCV 96.3   < > 88.4  --  88.2  --  89.3  --   --  90.7  --  87.7  PLT 255    < > 191  --  215  --  102*  --   --  99*  --  138*   < > = values in this interval not displayed.   BMP &GFR Recent Labs  Lab 05/18/22 2036 05/18/22 2037 05/19/22 0458 05/20/22 0522 05/21/22 0518 05/22/22 0512  NA  --  135 138 139 136 133*  K  --  4.2 4.7 4.0 3.8 3.5  CL  --  107 109 110 106 105  CO2  --  14* '22 23 22 '$ 21*  GLUCOSE  --  175* 185* 105* 93 89  BUN  --  41* 45* 31* 17 13  CREATININE  --  0.82 0.97 0.71 0.59* 0.53*  CALCIUM  --  6.9* 7.5* 7.3* 7.2* 7.3*  MG 1.7  --  2.4 2.1  --  1.9  PHOS 3.0  --  4.6 2.5 2.3* 2.3*   Estimated Creatinine Clearance: 81 mL/min (A) (by C-G formula based on SCr of 0.53 mg/dL (L)). Liver & Pancreas: Recent Labs  Lab 05/17/22 1847 05/19/22 0458 05/21/22 0518 05/22/22 0512  AST 37 34  --   --   ALT 23 19  --   --   ALKPHOS 57 34*  --   --   BILITOT 0.4 0.3  --   --   PROT 6.3* 4.2*  --   --   ALBUMIN 3.1* 2.1* 2.2* 2.5*   No results for input(s): "LIPASE", "AMYLASE" in the last 168 hours. No results for input(s): "AMMONIA" in the last 168 hours. Diabetic: No results for input(s): "HGBA1C" in the last 72 hours. Recent Labs  Lab 05/21/22 1531 05/21/22 2058 05/22/22 0535 05/22/22 0750 05/22/22 1228  GLUCAP 96 89 89 84 105*   Cardiac Enzymes: No results for input(s): "CKTOTAL", "CKMB", "CKMBINDEX", "TROPONINI" in the last 168 hours. No results for input(s): "PROBNP" in the last 8760 hours. Coagulation Profile: Recent Labs  Lab 05/18/22 2036  INR 1.4*   Thyroid Function Tests: No results for input(s): "TSH", "T4TOTAL", "FREET4", "T3FREE", "THYROIDAB" in the last 72 hours. Lipid Profile: No results for input(s): "CHOL", "HDL", "LDLCALC", "TRIG", "CHOLHDL", "LDLDIRECT" in the last 72 hours.  Anemia Panel: No results for input(s): "VITAMINB12", "FOLATE", "FERRITIN", "TIBC", "IRON", "RETICCTPCT" in the last 72 hours.  Urine analysis:    Component Value Date/Time   COLORURINE YELLOW (A) 05/18/2022 2135   APPEARANCEUR  HAZY (A) 05/18/2022 2135   LABSPEC >1.046 (H) 05/18/2022 2135   PHURINE 5.0 05/18/2022 2135   GLUCOSEU NEGATIVE 05/18/2022 2135   HGBUR LARGE (A) 05/18/2022 2135   BILIRUBINUR NEGATIVE 05/18/2022 2135   [place] NEGATIVE 05/18/2022 2135   PROTEINUR 100 (A) 05/18/2022 2135   NITRITE NEGATIVE 05/18/2022 2135   LEUKOCYTESUR NEGATIVE 05/18/2022 2135   Sepsis Labs: Invalid input(s): "PROCALCITONIN", "LACTICIDVEN"  Microbiology: Recent Results (from the past 240 hour(s))  Resp panel by RT-PCR (RSV, Flu A&B, Covid) Anterior Nasal Swab     Status: Abnormal   Collection Time: 05/17/22  7:53 PM   Specimen: Anterior Nasal Swab  Result Value Ref Range Status   SARS Coronavirus 2 by RT PCR NEGATIVE NEGATIVE Final    Comment: (NOTE) SARS-CoV-2 target nucleic acids are NOT DETECTED.  The SARS-CoV-2 RNA is generally detectable in upper respiratory specimens during the acute phase of infection. The lowest concentration of SARS-CoV-2 viral copies this assay can detect is 138 copies/mL. A negative result does not preclude SARS-Cov-2 infection and should not be used as the sole basis for treatment or other patient management decisions. A negative result may occur with  improper specimen collection/handling, submission of specimen other than nasopharyngeal swab, presence of viral mutation(s) within the areas targeted by this assay, and inadequate number of viral copies(<138 copies/mL). A negative result must be combined with clinical observations, patient history, and epidemiological information. The expected result is Negative.  Fact Sheet for Patients:  EntrepreneurPulse.com.au  Fact Sheet for Healthcare Providers:  IncredibleEmployment.be  This test is no t yet approved or cleared by the [place] FDA and  has been authorized for detection and/or diagnosis of SARS-CoV-2 by FDA under an Emergency Use Authorization (EUA). This EUA will remain  in  effect (meaning this test can be used) for the duration of the COVID-19 declaration under Section 564(b)(1) of the Act, 21 U.S.C.section 360bbb-3(b)(1), unless the authorization is terminated  or revoked sooner.       Influenza A by PCR POSITIVE (A) NEGATIVE Final   Influenza B by PCR NEGATIVE NEGATIVE Final    Comment: (NOTE) The Xpert Xpress SARS-CoV-2/FLU/RSV plus assay is intended as an aid in the diagnosis of influenza from Nasopharyngeal swab specimens and should not be used as a sole basis for treatment. Nasal washings and aspirates are unacceptable for Xpert Xpress SARS-CoV-2/FLU/RSV testing.  Fact Sheet for Patients: EntrepreneurPulse.com.au  Fact Sheet for Healthcare Providers: IncredibleEmployment.be  This test is not yet approved or cleared by the [place] FDA and has been authorized for detection and/or diagnosis of SARS-CoV-2 by FDA under an Emergency Use Authorization (EUA). This EUA will remain in effect (meaning this test can be used) for the duration of the COVID-19 declaration under Section 564(b)(1) of the Act, 21 U.S.C. section 360bbb-3(b)(1), unless the authorization is terminated or revoked.     Resp Syncytial Virus by PCR NEGATIVE NEGATIVE Final    Comment: (NOTE) Fact Sheet for Patients: EntrepreneurPulse.com.au  Fact Sheet for Healthcare Providers: IncredibleEmployment.be  This test is not yet approved or cleared by the [place] FDA and has been authorized for detection and/or diagnosis of SARS-CoV-2 by FDA under an Emergency Use Authorization (EUA). This EUA will remain in effect (meaning this test can be used) for the duration of the COVID-19 declaration under Section 564(b)(1) of the Act, 21 U.S.C. section 360bbb-3(b)(1), unless the authorization is  terminated or revoked.  Performed at [facility], [address] [place], [place] [address]   MRSA  Next Gen by PCR, Nasal     Status: None   Collection Time: 05/18/22  5:16 PM   Specimen: Nasal Mucosa; Nasal Swab  Result Value Ref Range Status   MRSA by PCR Next Gen NOT DETECTED NOT DETECTED Final    Comment: (NOTE) The GeneXpert MRSA Assay (FDA approved for NASAL specimens only), is one component of a comprehensive MRSA colonization surveillance program. It is not intended to diagnose MRSA infection nor to guide or monitor treatment for MRSA infections. Test performance is not FDA approved in patients less than 75 years old. Performed at [facility], [address], [place], [place] [address]     Radiology Studies: No results found.    Undra Harriman T. Byron  If 7PM-7AM, please contact night-coverage [email or web address] 05/22/2022, 2:22 PM

## 2022-05-23 ENCOUNTER — Encounter: Payer: Self-pay | Admitting: Gastroenterology

## 2022-05-23 ENCOUNTER — Telehealth: Payer: Self-pay

## 2022-05-23 ENCOUNTER — Other Ambulatory Visit: Payer: Self-pay | Admitting: Anatomic Pathology & Clinical Pathology

## 2022-05-23 DIAGNOSIS — R7989 Other specified abnormal findings of blood chemistry: Secondary | ICD-10-CM | POA: Diagnosis not present

## 2022-05-23 DIAGNOSIS — C159 Malignant neoplasm of esophagus, unspecified: Secondary | ICD-10-CM | POA: Insufficient documentation

## 2022-05-23 DIAGNOSIS — D62 Acute posthemorrhagic anemia: Secondary | ICD-10-CM | POA: Diagnosis not present

## 2022-05-23 DIAGNOSIS — D509 Iron deficiency anemia, unspecified: Secondary | ICD-10-CM | POA: Diagnosis not present

## 2022-05-23 DIAGNOSIS — K2289 Other specified disease of esophagus: Secondary | ICD-10-CM | POA: Diagnosis not present

## 2022-05-23 LAB — MAGNESIUM: Magnesium: 2.1 mg/dL (ref 1.7–2.4)

## 2022-05-23 LAB — CBC
HCT: 30.3 % — ABNORMAL LOW (ref 39.0–52.0)
Hemoglobin: 10.4 g/dL — ABNORMAL LOW (ref 13.0–17.0)
MCH: 29.5 pg (ref 26.0–34.0)
MCHC: 34.3 g/dL (ref 30.0–36.0)
MCV: 86.1 fL (ref 80.0–100.0)
Platelets: 192 10*3/uL (ref 150–400)
RBC: 3.52 MIL/uL — ABNORMAL LOW (ref 4.22–5.81)
RDW: 14.4 % (ref 11.5–15.5)
WBC: 6.4 10*3/uL (ref 4.0–10.5)
nRBC: 0 % (ref 0.0–0.2)

## 2022-05-23 LAB — RENAL FUNCTION PANEL
Albumin: 2.6 g/dL — ABNORMAL LOW (ref 3.5–5.0)
Anion gap: 9 (ref 5–15)
BUN: 10 mg/dL (ref 8–23)
CO2: 21 mmol/L — ABNORMAL LOW (ref 22–32)
Calcium: 7.5 mg/dL — ABNORMAL LOW (ref 8.9–10.3)
Chloride: 105 mmol/L (ref 98–111)
Creatinine, Ser: 0.66 mg/dL (ref 0.61–1.24)
GFR, Estimated: >60 mL/min (ref >60)
Glucose, Bld: 94 mg/dL (ref 70–99)
Phosphorus: 2.9 mg/dL (ref 2.5–4.6)
Potassium: 3.7 mmol/L (ref 3.5–5.1)
Sodium: 135 mmol/L (ref 135–145)

## 2022-05-23 LAB — GLUCOSE, CAPILLARY
Glucose-Capillary: 87 mg/dL (ref 70–99)
Glucose-Capillary: 88 mg/dL (ref 70–99)
Glucose-Capillary: 89 mg/dL (ref 70–99)

## 2022-05-23 LAB — SURGICAL PATHOLOGY

## 2022-05-23 MED ORDER — METOPROLOL SUCCINATE ER 50 MG PO TB24
50.0000 mg | ORAL_TABLET | Freq: Every day | ORAL | Status: DC
  Administered 2022-05-24: 50 mg via ORAL
  Filled 2022-05-23: qty 1

## 2022-05-23 MED ORDER — PANTOPRAZOLE SODIUM 40 MG PO TBEC
40.0000 mg | DELAYED_RELEASE_TABLET | Freq: Two times a day (BID) | ORAL | Status: DC
  Administered 2022-05-23 – 2022-05-24 (×2): 40 mg via ORAL
  Filled 2022-05-23 (×2): qty 1

## 2022-05-23 MED ORDER — FOLIC ACID 1 MG PO TABS
1.0000 mg | ORAL_TABLET | Freq: Every day | ORAL | Status: DC
  Administered 2022-05-23 – 2022-05-24 (×2): 1 mg via ORAL
  Filled 2022-05-23 (×2): qty 1

## 2022-05-23 MED ORDER — METOPROLOL TARTRATE 25 MG PO TABS
25.0000 mg | ORAL_TABLET | Freq: Two times a day (BID) | ORAL | Status: AC
  Administered 2022-05-23: 25 mg via ORAL
  Filled 2022-05-23: qty 1

## 2022-05-23 MED ORDER — POTASSIUM CHLORIDE 20 MEQ PO PACK
40.0000 meq | PACK | Freq: Once | ORAL | Status: AC
  Administered 2022-05-23: 40 meq via ORAL
  Filled 2022-05-23: qty 2

## 2022-05-23 NOTE — Telephone Encounter (Signed)
Notified by Dr. 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 Dr. Grayland Ormond is planning outpatient PET/EUS.

## 2022-05-23 NOTE — Progress Notes (Signed)
Occupational Therapy Treatment Patient Details Name: Bobby Moreno MRN: [id number] DOB: June 06, 1945 Today's Date: 05/23/2022   History of present illness Pt is a 77 y/o M admitted on 05/17/22 after presenting to the ED with chief concerns of syncope. Pt tested + for Flu A & was found to have sinus tachycardia. While in ED waiting for a bed pt had 3 syncopal episodes, becoming unresponsive & Code Blue initiated on the 3rd. Pt noted to have acute GI bleed with maroon colored stools. 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  PMH: HTN, HLD, paroxysmal supraventricular tachycardia   OT comments  Upon entering the room, pt supine in bed with wife present in room. Pt is agreeable to OT intervention and reports feeling much better. Pt performs bed mobility without assistance. Pt stands and ambulates 300' without use of AD and supervision. Pt is able to stop and pick up items from floor without issue or LOB. When pt returns to room he demonstrates toileting with supervision overall. Pt making excellent progress. Recommendation changed to no follow up and no further need for AD at this time based on pt's progress this session.    Recommendations for follow up therapy are one component of a multi-disciplinary discharge planning process, led by the attending physician.  Recommendations may be updated based on patient status, additional functional criteria and insurance authorization.    Follow Up Recommendations  No OT follow up     Assistance Recommended at Discharge Intermittent Supervision/Assistance  Patient can return home with the following  A little help with walking and/or transfers;A little help with bathing/dressing/bathroom;Assistance with cooking/housework;Help with stairs or ramp for entrance;Assist for transportation   Equipment Recommendations  None recommended by OT       Precautions / Restrictions  Precautions Precautions: Fall       Mobility Bed Mobility Overal bed mobility: Modified Independent       Supine to sit: Modified independent (Device/Increase time), HOB elevated          Transfers Overall transfer level: Needs assistance Equipment used: None Transfers: Sit to/from Stand Sit to Stand: Supervision                 Balance Overall balance assessment: Needs assistance Sitting-balance support: Feet supported, Bilateral upper extremity supported Sitting balance-Leahy Scale: Good Sitting balance - Comments: supervision static sitting   Standing balance support: During functional activity, No upper extremity supported Standing balance-Leahy Scale: Good                             ADL either performed or assessed with clinical judgement   ADL                           Toilet Transfer: Supervision/safety   Toileting- Clothing Manipulation and Hygiene: Supervision/safety              Extremity/Trunk Assessment              Vision Patient Visual Report: No change from baseline            Cognition Arousal/Alertness: Awake/alert Behavior During Therapy: WFL for tasks assessed/performed Overall Cognitive Status: Within Functional Limits for tasks assessed  General Comments: Pleasant, follows instructions throughout session                   Pertinent Vitals/ Pain       Pain Assessment Pain Assessment: No/denies pain         Frequency  Min 2X/week        Progress Toward Goals  OT Goals(current goals can now be found in the care plan section)  Progress towards OT goals: Progressing toward goals  Acute Rehab OT Goals Patient Stated Goal: to get stronger and return home OT Goal Formulation: With patient/family Time For Goal Achievement: 06/02/22 Potential to Achieve Goals: Good  Plan Frequency remains appropriate;Discharge plan needs to be updated        AM-PAC OT "6 Clicks" Daily Activity     Outcome Measure   Help from another person eating meals?: None Help from another person taking care of personal grooming?: A Little Help from another person toileting, which includes using toliet, bedpan, or urinal?: A Little Help from another person bathing (including washing, rinsing, drying)?: A Little Help from another person to put on and taking off regular upper body clothing?: A Little Help from another person to put on and taking off regular lower body clothing?: A Lot 6 Click Score: 18    End of Session    OT Visit Diagnosis: Unsteadiness on feet (R26.81);Repeated falls (R29.6);Muscle weakness (generalized) (M62.81)   Activity Tolerance Patient tolerated treatment well   Patient Left in bed;with call bell/phone within reach;with bed alarm set;with family/visitor present   Nurse Communication Mobility status        Time: 3875-6433 OT Time Calculation (min): 11 min  Charges: OT General Charges $OT Visit: 1 Visit OT Treatments $Self Care/Home Management : 8-22 mins  Darleen Crocker, MS, OTR/L , CBIS ascom [phone]  05/23/22, 3:23 PM

## 2022-05-23 NOTE — Progress Notes (Signed)
Physical Therapy Treatment Patient Details Name: Bobby Moreno MRN: [id number] DOB: 09/13/1945 Today's Date: 05/23/2022   History of Present Illness Pt is a 77 y/o M admitted on 05/17/22 after presenting to the ED with chief concerns of syncope. Pt tested + for Flu A & was found to have sinus tachycardia. While in ED waiting for a bed pt had 3 syncopal episodes, becoming unresponsive & Code Blue initiated on the 3rd. Pt noted to have acute GI bleed with maroon colored stools. 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  PMH: HTN, HLD, paroxysmal supraventricular tachycardia    PT Comments    Pt seen for PT tx with pt agreeable, wife present in room. Pt motivated to participate, on room air now. Pt is able to complete bed mobility with mod I, STS without AD with supervision, and ambulate in hallways without AD & supervision. Pt negotiates 4 steps with L rail & supervision to simulate home entrance. Pt denies lightheadedness/dizziness with mobility and does not demonstrate overt LOB. Pt has progressed well in regards to functional mobility therefore will update d/c recommendations to no f/u therapy - pt & wife educated on this, as well as need to continue mobilizing.    Recommendations for follow up therapy are one component of a multi-disciplinary discharge planning process, led by the attending physician.  Recommendations may be updated based on patient status, additional functional criteria and insurance authorization.  Follow Up Recommendations  No PT follow up     Assistance Recommended at Discharge PRN  Patient can return home with the following Assistance with cooking/housework;Help with stairs or ramp for entrance   Equipment Recommendations  None recommended by PT    Recommendations for Other Services       Precautions / Restrictions Precautions Precautions: Fall Restrictions Weight Bearing  Restrictions: No     Mobility  Bed Mobility Overal bed mobility: Modified Independent Bed Mobility: Supine to Sit     Supine to sit: Modified independent (Device/Increase time), HOB elevated          Transfers Overall transfer level: Needs assistance Equipment used: None Transfers: Sit to/from Stand Sit to Stand: Supervision                Ambulation/Gait Ambulation/Gait assistance: Supervision Gait Distance (Feet): 200 Feet Assistive device: None   Gait velocity: slightly decreased         Stairs Stairs: Yes Stairs assistance: Supervision Stair Management: One rail Left Number of Stairs: 4     Wheelchair Mobility    Modified Rankin (Stroke Patients Only)       Balance Overall balance assessment: Needs assistance Sitting-balance support: Feet supported, Bilateral upper extremity supported Sitting balance-Leahy Scale: Good     Standing balance support: During functional activity, No upper extremity supported Standing balance-Leahy Scale: Good                              Cognition Arousal/Alertness: Awake/alert Behavior During Therapy: WFL for tasks assessed/performed Overall Cognitive Status: Within Functional Limits for tasks assessed                                 General Comments: Pleasant, follows instructions throughout session        Exercises      General Comments General comments (skin integrity, edema, etc.):  Pt on room air throughout session.      Pertinent Vitals/Pain Pain Assessment Pain Assessment: No/denies pain    Home Living                          Prior Function            PT Goals (current goals can now be found in the care plan section) Acute Rehab PT Goals Patient Stated Goal: get better PT Goal Formulation: With patient Time For Goal Achievement: 06/02/22 Potential to Achieve Goals: Good Progress towards PT goals: Progressing toward goals    Frequency     Min 2X/week      PT Plan Discharge plan needs to be updated;Equipment recommendations need to be updated    Co-evaluation              AM-PAC PT "6 Clicks" Mobility   Outcome Measure  Help needed turning from your back to your side while in a flat bed without using bedrails?: None Help needed moving from lying on your back to sitting on the side of a flat bed without using bedrails?: None Help needed moving to and from a bed to a chair (including a wheelchair)?: None Help needed standing up from a chair using your arms (e.g., wheelchair or bedside chair)?: None Help needed to walk in hospital room?: A Little Help needed climbing 3-5 steps with a railing? : A Little 6 Click Score: 22    End of Session   Activity Tolerance: Patient tolerated treatment well Patient left: in chair;with call bell/phone within reach;with family/visitor present Nurse Communication: Mobility status PT Visit Diagnosis: Unsteadiness on feet (R26.81);Muscle weakness (generalized) (M62.81);Difficulty in walking, not elsewhere classified (R26.2)     Time: 5885-0277 PT Time Calculation (min) (ACUTE ONLY): 12 min  Charges:  $Therapeutic Activity: 8-22 mins                     Lavone Nian, PT, DPT 05/23/22, 10:32 AM    [name] 05/23/2022, 10:32 AM

## 2022-05-23 NOTE — Progress Notes (Signed)
PROGRESS NOTE  Bobby Moreno WIO:[id number] DOB: 1945-06-29   PCP: Zeb Comfort, MD  Patient is from: Home.  Lives with his wife.  DOA: 05/17/2022 LOS: 6  Chief complaints Chief Complaint  Patient presents with   Palpitations     Brief Narrative / Interim history: 77 year old M with PMH of paroxysmal SVT, HTN and anemia presented to [facility] ER by EMS with palpitation.  Upon EMS arrival, patient was in SVT and was hypotensive to 70/40.  DVT did not resolve with vagal maneuver but aborted by 6 mg of adenosine followed by 12 mg of adenosine.  He converted to NSR.  He tested positive for influenza A in ED.  Patient had recurrent syncope while in ED with diaphoresis and hypotension.  CODE BLUE initiated on 1/10.  Noted to have acute GI bleed with maroon-colored stools.  GI consulted.  Transfused blood.  CT angio showed active bleeding in proximal stomach near the gastroesophageal junction with moderate distention of the stomach.  CT angio also showed new indeterminate rim-enhancing lesion in the left lobe of the liver.  Patient was mechanically intubated for EGD.  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  Oncology consulted and saw patient on 1/11.   Patient had repeat EGD with biopsy on 1/12. eventually, he was extubated and transferred to [facility] hospital[facility] service on 1/13.  Patient remained stable since transfer.  Hemoglobin remained stable as well.  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  Advancing diet as tolerated  Subjective: Seen and examined earlier this morning.  No major events overnight of this morning.  Feels well other than the anxiety about pending pathology result.  Had bowel movements with some dark stool.  No blood clots or fresh red blood.  Patient's wife at bedside.  Objective: Vitals:   05/22/22 2033 05/23/22 0500 05/23/22 0535  05/23/22 0813  BP: (!) 162/85  139/80 (!) 140/94  Pulse: 88  93 93  Resp: '16  18 17  '$ Temp: 97.8 F (36.6 C)  97.6 F (36.4 C) 97.7 F (36.5 C)  TempSrc:   Oral Oral  SpO2: 93%  94% 94%  Weight:  80.4 kg    Height:        Examination:  GENERAL: No apparent distress.  Nontoxic. HEENT: MMM.  Vision and hearing grossly intact.  NECK: Supple.  No apparent JVD.  RESP:  No IWOB.  Fair aeration bilaterally. CVS:  RRR. Heart sounds normal.  ABD/GI/GU: BS+. Abd soft, NTND.  MSK/EXT:  Moves extremities. No apparent deformity. No edema.  SKIN: no apparent skin lesion or wound NEURO: Awake and alert. Oriented appropriately.  No apparent focal neuro deficit. PSYCH: Calm. Normal affect.   Procedures:  1/10-EGD showed a partially obstructing esophageal tumor at the gastroesophageal junction. 1/12-EGD showed partially obstructing esophageal trauma and the lower third of esophagus.  Biopsied. 1/10-1/12-intubation and mechanical ventilation.  Microbiology summarized: 1/9-influenza A PCR positive. 1/9-COVID-19, influenza B and RSV PCR nonreactive. 1/9-MRSA PCR screen negative.  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  CT angio was concerning for bleeding from gastroesophageal junction likely from esophageal tumor in the lower third of esophagus as noted on EGD.  Hemostatic spray applied on 1/10.  Anemia panel with iron deficiency.   H&H improved likely some degree of hemoconcentration. Recent Labs    05/19/22 1117 05/19/22 1859 05/20/22 0053 05/20/22 0522 05/20/22 1125 05/20/22 1727 05/21/22 0518 05/21/22 1731 05/22/22 0512 05/23/22 0212  HGB 7.8* 7.1* 8.7* 8.6* 9.4* 8.8* 8.6* 10.3* 10.3* 10.4*  -Monitor H&H -IV Protonix 40 mg twice daily -GI signed off  Esophageal tumor/adenocarcinoma: Noted on EGD.  Biopsy taken on 1/12.  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 -Oncology notified of pathology result. -Continue IV Unasyn until 1/16 as previously recommended by intensivist  Esophageal dysphagia: Likely due to esophageal tumor. -Has been on clears.  Advance diet as tolerated to soft.  Discussed with on-call GI.  Paroxysmal SVT/new onset A-fib/syncope: Found to be in SVT when EMS arrived.  Likely provoked.  Required adenosine 6 mg followed by 12 mg.  TTE with LVEF of 65 to 70%, G1-DD but no RWMA -Cardiology signed off. -Continue metoprolol 12.5 mg twice daily -Optimize electrolytes  Influenza A infection -Was not started on Tamiflu.  Essential hypertension: BP elevated. -Continue metoprolol 12.5 mg twice daily  Thrombocytopenia: Resolved. -Continue monitoring  Hypokalemia/hypophosphatemia -P.o. KCl 40 x 1  Physical deconditioning -PT/OT eval   Body mass index is 26.18 kg/m.  Inadequate oral intake Nutrition Problem: Inadequate oral intake Etiology: dysphagia Signs/Symptoms: NPO status Interventions: Refer to RD note for recommendations   DVT prophylaxis:  Place and maintain sequential compression device Start: 05/18/22 1027 Place TED hose Start: 05/17/22 2156  Code Status: Full code Family Communication: Updated patient's wife at bedside Level of care: Med-Surg Status is: Inpatient Remains inpatient appropriate because: Esophageal tumor/dysphagia   Final disposition: Likely home on 1/16 if  cleared by oncology. Consultants:  Cardiology Pulmonology Gastroenterology Oncology  35 minutes with more than 50% spent in reviewing records, counseling patient/family and coordinating care.   Sch Meds:  Scheduled Meds:  feeding supplement  1 Container Oral TID BM   folic acid  1 mg Intravenous Daily   metoprolol tartrate  12.5 mg Oral BID   multivitamin with minerals  1 tablet Oral Daily   pantoprazole  40 mg Intravenous Q12H   pravastatin  40 mg Per Tube QHS   sodium chloride flush  10-40 mL Intracatheter Q12H   thiamine (VITAMIN B1) injection  100 mg Intravenous Daily   Continuous Infusions:  sodium chloride 250 mL (05/22/22 2250)   ampicillin-sulbactam (UNASYN) IV 3 g (05/23/22 0844)   PRN Meds:.acetaminophen, ondansetron (ZOFRAN) IV, mouth rinse, senna-docusate, sodium chloride flush  Antimicrobials: Anti-infectives (From admission, onward)    Start     Dose/Rate Route Frequency Ordered Stop   05/20/22 2200  Ampicillin-Sulbactam (UNASYN) 3 g in sodium chloride 0.9 % 100 mL IVPB        3 g 200 mL/hr over 30 Minutes Intravenous Every 6 hours 05/20/22 1022 05/24/22 2159   05/19/22 2200  cefTRIAXone (ROCEPHIN) 1 g in sodium chloride 0.9 % 100 mL IVPB  Status:  Discontinued        1 g 200 mL/hr over 30 Minutes Intravenous Every 12 hours 05/19/22  1901 05/19/22 1901   05/19/22 2000  cefTRIAXone (ROCEPHIN) 2 g in sodium chloride 0.9 % 100 mL IVPB  Status:  Discontinued        2 g 200 mL/hr over 30 Minutes Intravenous Every 24 hours 05/19/22 1901 05/20/22 1022        I have personally reviewed the following labs and images: CBC: Recent Labs  Lab 05/17/22 1847 05/18/22 0405 05/19/22 0458 05/19/22 1117 05/20/22 0522 05/20/22 1125 05/20/22 1727 05/21/22 0518 05/21/22 1731 05/22/22 0512 05/23/22 0212  WBC 8.9   < > 11.5*  --  4.6  --   --  6.0  --  6.9 6.4  NEUTROABS 7.3  --   --   --  3.5  --   --   --   --   --   --   HGB 9.8*   < > 8.6*   < > 8.6*   < > 8.8*  8.6* 10.3* 10.3* 10.4*  HCT 30.8*   < > 26.1*   < > 26.0*   < > 26.1* 26.3* 30.7* 30.8* 30.3*  MCV 96.3   < > 88.2  --  89.3  --   --  90.7  --  87.7 86.1  PLT 255   < > 215  --  102*  --   --  99*  --  138* 192   < > = values in this interval not displayed.   BMP &GFR Recent Labs  Lab 05/18/22 2036 05/18/22 2037 05/19/22 0458 05/20/22 0522 05/21/22 0518 05/22/22 0512 05/23/22 0212  NA  --    < > 138 139 136 133* 135  K  --    < > 4.7 4.0 3.8 3.5 3.7  CL  --    < > 109 110 106 105 105  CO2  --    < > '22 23 22 '$ 21* 21*  GLUCOSE  --    < > 185* 105* 93 89 94  BUN  --    < > 45* 31* '17 13 10  '$ CREATININE  --    < > 0.97 0.71 0.59* 0.53* 0.66  CALCIUM  --    < > 7.5* 7.3* 7.2* 7.3* 7.5*  MG 1.7  --  2.4 2.1  --  1.9 2.1  PHOS 3.0  --  4.6 2.5 2.3* 2.3* 2.9   < > = values in this interval not displayed.   Estimated Creatinine Clearance: 81 mL/min (by C-G formula based on SCr of 0.66 mg/dL). Liver & Pancreas: Recent Labs  Lab 05/17/22 1847 05/19/22 0458 05/21/22 0518 05/22/22 0512 05/23/22 0212  AST 37 34  --   --   --   ALT 23 19  --   --   --   ALKPHOS 57 34*  --   --   --   BILITOT 0.4 0.3  --   --   --   PROT 6.3* 4.2*  --   --   --   ALBUMIN 3.1* 2.1* 2.2* 2.5* 2.6*   No results for input(s): "LIPASE", "AMYLASE" in the last 168 hours. No results for input(s): "AMMONIA" in the last 168 hours. Diabetic: No results for input(s): "HGBA1C" in the last 72 hours. Recent Labs  Lab 05/22/22 1228 05/22/22 1650 05/23/22 0051 05/23/22 0416 05/23/22 0810  GLUCAP 105* 101* 89 88 87   Cardiac Enzymes: No results for input(s): "CKTOTAL", "CKMB", "CKMBINDEX", "TROPONINI" in the last 168 hours. No results for input(s): "PROBNP" in  the last 8760 hours. Coagulation Profile: Recent Labs  Lab 05/18/22 2036  INR 1.4*   Thyroid Function Tests: No results for input(s): "TSH", "T4TOTAL", "FREET4", "T3FREE", "THYROIDAB" in the last 72 hours. Lipid Profile: No results for  input(s): "CHOL", "HDL", "LDLCALC", "TRIG", "CHOLHDL", "LDLDIRECT" in the last 72 hours.  Anemia Panel: No results for input(s): "VITAMINB12", "FOLATE", "FERRITIN", "TIBC", "IRON", "RETICCTPCT" in the last 72 hours.  Urine analysis:    Component Value Date/Time   COLORURINE YELLOW (A) 05/18/2022 2135   APPEARANCEUR HAZY (A) 05/18/2022 2135   LABSPEC >1.046 (H) 05/18/2022 2135   PHURINE 5.0 05/18/2022 2135   GLUCOSEU NEGATIVE 05/18/2022 2135   HGBUR LARGE (A) 05/18/2022 2135   BILIRUBINUR NEGATIVE 05/18/2022 2135   [place] NEGATIVE 05/18/2022 2135   PROTEINUR 100 (A) 05/18/2022 2135   NITRITE NEGATIVE 05/18/2022 2135   LEUKOCYTESUR NEGATIVE 05/18/2022 2135   Sepsis Labs: Invalid input(s): "PROCALCITONIN", "LACTICIDVEN"  Microbiology: Recent Results (from the past 240 hour(s))  Resp panel by RT-PCR (RSV, Flu A&B, Covid) Anterior Nasal Swab     Status: Abnormal   Collection Time: 05/17/22  7:53 PM   Specimen: Anterior Nasal Swab  Result Value Ref Range Status   SARS Coronavirus 2 by RT PCR NEGATIVE NEGATIVE Final    Comment: (NOTE) SARS-CoV-2 target nucleic acids are NOT DETECTED.  The SARS-CoV-2 RNA is generally detectable in upper respiratory specimens during the acute phase of infection. The lowest concentration of SARS-CoV-2 viral copies this assay can detect is 138 copies/mL. A negative result does not preclude SARS-Cov-2 infection and should not be used as the sole basis for treatment or other patient management decisions. A negative result may occur with  improper specimen collection/handling, submission of specimen other than nasopharyngeal swab, presence of viral mutation(s) within the areas targeted by this assay, and inadequate number of viral copies(<138 copies/mL). A negative result must be combined with clinical observations, patient history, and epidemiological information. The expected result is Negative.  Fact Sheet for Patients:   EntrepreneurPulse.com.au  Fact Sheet for Healthcare Providers:  IncredibleEmployment.be  This test is no t yet approved or cleared by the [place] FDA and  has been authorized for detection and/or diagnosis of SARS-CoV-2 by FDA under an Emergency Use Authorization (EUA). This EUA will remain  in effect (meaning this test can be used) for the duration of the COVID-19 declaration under Section 564(b)(1) of the Act, 21 U.S.C.section 360bbb-3(b)(1), unless the authorization is terminated  or revoked sooner.       Influenza A by PCR POSITIVE (A) NEGATIVE Final   Influenza B by PCR NEGATIVE NEGATIVE Final    Comment: (NOTE) The Xpert Xpress SARS-CoV-2/FLU/RSV plus assay is intended as an aid in the diagnosis of influenza from Nasopharyngeal swab specimens and should not be used as a sole basis for treatment. Nasal washings and aspirates are unacceptable for Xpert Xpress SARS-CoV-2/FLU/RSV testing.  Fact Sheet for Patients: EntrepreneurPulse.com.au  Fact Sheet for Healthcare Providers: IncredibleEmployment.be  This test is not yet approved or cleared by the [place] FDA and has been authorized for detection and/or diagnosis of SARS-CoV-2 by FDA under an Emergency Use Authorization (EUA). This EUA will remain in effect (meaning this test can be used) for the duration of the COVID-19 declaration under Section 564(b)(1) of the Act, 21 U.S.C. section 360bbb-3(b)(1), unless the authorization is terminated or revoked.     Resp Syncytial Virus by PCR NEGATIVE NEGATIVE Final    Comment: (NOTE) Fact Sheet for Patients: EntrepreneurPulse.com.au  Fact  Sheet for Healthcare Providers: IncredibleEmployment.be  This test is not yet approved or cleared by the [place] and has been authorized for detection and/or diagnosis of SARS-CoV-2 by FDA under an Emergency Use  Authorization (EUA). This EUA will remain in effect (meaning this test can be used) for the duration of the COVID-19 declaration under Section 564(b)(1) of the Act, 21 U.S.C. section 360bbb-3(b)(1), unless the authorization is terminated or revoked.  Performed at [facility], [address] [place], [place] [address]   MRSA Next Gen by PCR, Nasal     Status: None   Collection Time: 05/18/22  5:16 PM   Specimen: Nasal Mucosa; Nasal Swab  Result Value Ref Range Status   MRSA by PCR Next Gen NOT DETECTED NOT DETECTED Final    Comment: (NOTE) The GeneXpert MRSA Assay (FDA approved for NASAL specimens only), is one component of a comprehensive MRSA colonization surveillance program. It is not intended to diagnose MRSA infection nor to guide or monitor treatment for MRSA infections. Test performance is not FDA approved in patients less than 2 years old. Performed at [facility], [address], [place], [place] [address]     Radiology Studies: No results found.    Lorianna Spadaccini T. [place]  If 7PM-7AM, please contact night-coverage [email or web address] 05/23/2022, 1:15 PM

## 2022-05-23 NOTE — Care Management Important Message (Signed)
Important Message  Patient Details  Name: Bobby Moreno MRN: [id number] Date of Birth: [birth year]   Medicare Important Message Given:  Yes  Patient is in an isolation room so I reviewed his Important Message from Medicare by phone [phone]). He stated he understood his rights and I wished him a speedy recovery and thanked him for his time.   Juliann Pulse A Kou Gucciardo 05/23/2022, 11:38 AM

## 2022-05-24 ENCOUNTER — Other Ambulatory Visit: Payer: Self-pay

## 2022-05-24 ENCOUNTER — Encounter: Payer: Self-pay | Admitting: Gastroenterology

## 2022-05-24 DIAGNOSIS — C159 Malignant neoplasm of esophagus, unspecified: Secondary | ICD-10-CM | POA: Diagnosis not present

## 2022-05-24 DIAGNOSIS — R000 Tachycardia, unspecified: Secondary | ICD-10-CM

## 2022-05-24 DIAGNOSIS — D62 Acute posthemorrhagic anemia: Secondary | ICD-10-CM | POA: Diagnosis not present

## 2022-05-24 DIAGNOSIS — K922 Gastrointestinal hemorrhage, unspecified: Secondary | ICD-10-CM | POA: Diagnosis not present

## 2022-05-24 DIAGNOSIS — R7989 Other specified abnormal findings of blood chemistry: Secondary | ICD-10-CM | POA: Diagnosis not present

## 2022-05-24 DIAGNOSIS — C155 Malignant neoplasm of lower third of esophagus: Secondary | ICD-10-CM

## 2022-05-24 LAB — BPAM FFP
Blood Product Expiration Date: 202401152359
Blood Product Expiration Date: 202401152359
ISSUE DATE / TIME: 202401102031
Unit Type and Rh: 5100
Unit Type and Rh: 5100

## 2022-05-24 LAB — CBC
HCT: 30.5 % — ABNORMAL LOW (ref 39.0–52.0)
Hemoglobin: 10.3 g/dL — ABNORMAL LOW (ref 13.0–17.0)
MCH: 29.3 pg (ref 26.0–34.0)
MCHC: 33.8 g/dL (ref 30.0–36.0)
MCV: 86.9 fL (ref 80.0–100.0)
Platelets: 261 10*3/uL (ref 150–400)
RBC: 3.51 MIL/uL — ABNORMAL LOW (ref 4.22–5.81)
RDW: 14.4 % (ref 11.5–15.5)
WBC: 7.2 10*3/uL (ref 4.0–10.5)
nRBC: 0 % (ref 0.0–0.2)

## 2022-05-24 LAB — PREPARE FRESH FROZEN PLASMA

## 2022-05-24 LAB — RETICULOCYTES
Immature Retic Fract: 28.3 % — ABNORMAL HIGH (ref 2.3–15.9)
RBC.: 3.46 MIL/uL — ABNORMAL LOW (ref 4.22–5.81)
Retic Count, Absolute: 55.4 10*3/uL (ref 19.0–186.0)
Retic Ct Pct: 1.6 % (ref 0.4–3.1)

## 2022-05-24 LAB — IRON AND TIBC
Iron: 25 ug/dL — ABNORMAL LOW (ref 45–182)
Saturation Ratios: 10 % — ABNORMAL LOW (ref 17.9–39.5)
TIBC: 242 ug/dL — ABNORMAL LOW (ref 250–450)
UIBC: 217 ug/dL

## 2022-05-24 LAB — RENAL FUNCTION PANEL
Albumin: 2.7 g/dL — ABNORMAL LOW (ref 3.5–5.0)
Anion gap: 6 (ref 5–15)
BUN: 11 mg/dL (ref 8–23)
CO2: 24 mmol/L (ref 22–32)
Calcium: 7.8 mg/dL — ABNORMAL LOW (ref 8.9–10.3)
Chloride: 105 mmol/L (ref 98–111)
Creatinine, Ser: 0.74 mg/dL (ref 0.61–1.24)
GFR, Estimated: >60 mL/min (ref >60)
Glucose, Bld: 100 mg/dL — ABNORMAL HIGH (ref 70–99)
Phosphorus: 3.2 mg/dL (ref 2.5–4.6)
Potassium: 3.8 mmol/L (ref 3.5–5.1)
Sodium: 135 mmol/L (ref 135–145)

## 2022-05-24 LAB — FOLATE: Folate: 27 ng/mL (ref >5.9)

## 2022-05-24 LAB — MAGNESIUM: Magnesium: 2.2 mg/dL (ref 1.7–2.4)

## 2022-05-24 LAB — FERRITIN: Ferritin: 23 ng/mL — ABNORMAL LOW (ref 24–336)

## 2022-05-24 LAB — VITAMIN B12: Vitamin B-12: 3917 pg/mL — ABNORMAL HIGH (ref 180–914)

## 2022-05-24 MED ORDER — PANTOPRAZOLE SODIUM 40 MG PO TBEC: 40.0000 mg | DELAYED_RELEASE_TABLET | Freq: Two times a day (BID) | ORAL | 0 refills | Status: AC

## 2022-05-24 MED ORDER — SODIUM CHLORIDE 0.9 % IV SOLN
250.0000 mg | Freq: Once | INTRAVENOUS | Status: AC
  Administered 2022-05-24: 250 mg via INTRAVENOUS
  Filled 2022-05-24: qty 20

## 2022-05-24 MED ORDER — ADULT MULTIVITAMIN W/MINERALS CH: 1.0000 | ORAL_TABLET | Freq: Every day | ORAL | Status: DC

## 2022-05-24 MED ORDER — FOLIC ACID 1 MG PO TABS: 1.0000 mg | ORAL_TABLET | Freq: Every day | ORAL | 0 refills | Status: AC

## 2022-05-24 NOTE — Progress Notes (Signed)
Pt being discharged home, dc instructions reviewed with pt and wife, pt with no complaints

## 2022-05-24 NOTE — TOC Transition Note (Addendum)
Transition of Care [facility]) - CM/SW Discharge Note   Patient Details  Name: Harm Jou MRN: [id number] Date of Birth: 29-Dec-1945  [facility] CM/SW Contact:  Quin Hoop, LCSW Phone Number: 05/24/2022, 10:15 AM   Clinical Narrative:    D/C summary complete.  Patient to discharge home with no recommended follow-up services.  CSW ordered RW to be delivered to patient room prior to discharge.    Per Suanne Marker, [employer], pt refused DME.  Stated that he's been walking fine all along and he doesn't want RW.  Final next level of care: Home/Self Care Barriers to Discharge: No Barriers Identified   Patient Goals and CMS Choice      Discharge Placement                         Discharge Plan and Services Additional resources added to the After Visit Summary for     Discharge Planning Services: CM Consult                                 Social Determinants of Health (SDOH) Interventions SDOH Screenings   Food Insecurity: No Food Insecurity (05/18/2022)  Housing: Low Risk  (05/18/2022)  Transportation Needs: No Transportation Needs (05/18/2022)  Utilities: Not At Risk (05/18/2022)  Tobacco Use: Medium Risk (05/23/2022)     Readmission Risk Interventions     No data to display

## 2022-05-24 NOTE — Discharge Summary (Signed)
Physician Discharge Summary  Bobby Moreno PYP:[id number] DOB: 1946-03-13 DOA: 05/17/2022  PCP: Zeb Comfort, MD  Admit date: 05/17/2022 Discharge date: 05/24/2022 Admitted From: Home Disposition: Home Recommendations for Outpatient Follow-up:  Follow up with PCP in as below Oncology to arrange outpatient follow-up Check CMP and CBC at follow-up Please follow up on the following pending results: None  Home Health: Not indicated Equipment/Devices: Rolling walker  Discharge Condition: Stable CODE STATUS: Full code  Follow-up Information     Zeb Comfort, MD. Schedule an appointment as soon as possible for a visit in 1 week(s).   Specialty: Family Medicine Contact information: [address] [place]  [address] [phone]         Lloyd Huger, MD. Schedule an appointment as soon as possible for a visit in 1 week(s).   Specialty: Oncology Contact information: [place] [address] [facility] course 77 year old M with PMH of paroxysmal SVT, HTN and anemia presented to [facility] ER by EMS with palpitation.  Upon EMS arrival, patient was in SVT and was hypotensive to 70/40.  DVT did not resolve with vagal maneuver but aborted by 6 mg of adenosine followed by 12 mg of adenosine.  He converted to NSR.  He tested positive for influenza A in ED.  Patient had recurrent syncope while in ED with diaphoresis and hypotension.  CODE BLUE initiated on 1/10.  Noted to have acute GI bleed with maroon-colored stools.  GI consulted.  Transfused blood.  CT angio showed active bleeding in proximal stomach near the gastroesophageal junction with moderate distention of the stomach.  CT angio also showed new indeterminate rim-enhancing lesion in the left lobe of the liver.  Patient was mechanically intubated for EGD.  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  Oncology  consulted and saw patient on 1/11.   Patient had repeat EGD with biopsy on 1/12. eventually, he was extubated and transferred to [facility] hospitalist service on 1/13.   Patient remained stable since transfer.  Hemoglobin remained stable as well.  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  Oncology to arrange outpatient follow-up for further evaluation and management of esophageal cancer.   Patient tolerated soft diet prior to discharge.  Evaluated by therapy and no need identified.  See individual problem list below for more.   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  CT angio was concerning for bleeding from gastroesophageal junction likely from esophageal tumor in the lower third of esophagus as noted on EGD.  Hemostatic spray applied on 1/10.  Anemia panel with iron deficiency.  H&H improved likely some degree of hemoconcentration. Recent Labs    05/19/22 1859 05/20/22 0053 05/20/22 0522 05/20/22 1125  05/20/22 1727 05/21/22 0518 05/21/22 1731 05/22/22 0512 05/23/22 0212 05/24/22 0349  HGB 7.1* 8.7* 8.6* 9.4* 8.8* 8.6* 10.3* 10.3* 10.4* 10.3*  -Received IV ferric gluconate 250 mg x 1 -Continue p.o. Protonix 40 mg twice daily -Received IV Unasyn for 1/12-1/16 -Continue soft diet   Esophageal  tumor/adenocarcinoma: Noted on EGD.  Biopsy taken on 1/12.  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 -Oncology notified of pathology result.    Esophageal dysphagia: Likely due to esophageal tumor.  Tolerated soft diet. -Discharged on soft diet.   Paroxysmal SVT/new onset A-fib/syncope: Found to be in SVT when EMS arrived.  Likely provoked.  Required adenosine 6 mg followed by 12 mg.  TTE with LVEF of 65 to 70%, G1-DD but no RWMA -Cardiology signed off. -Continue home Toprol-XL   Influenza A infection -Was not started on Tamiflu.   Essential hypertension: BP elevated. -Continue home Toprol-XL.   Thrombocytopenia: Resolved. -Continue monitoring   Hypokalemia/hypophosphatemia: Resolved. -P.o. KCl 40 x 1   Physical deconditioning: Improved.  No need identified by therapy.   Vital signs Vitals:   05/23/22 1605 05/23/22 2143 05/24/22 0438 05/24/22 0620  BP: (!) 143/69 (!) 149/80  (!) 157/74  Pulse: 93 87  75  Temp: 97.8 F (36.6 C) 98 F (36.7 C)  98.5 F (36.9 C)  Resp: '18 18  18  '$ Height:      Weight:   79.5 kg   SpO2: 92% 94%  92%  TempSrc: Oral     BMI (Calculated):   25.87      Discharge exam  GENERAL: No apparent distress.  Nontoxic. HEENT: MMM.  Vision and hearing grossly intact.  NECK: Supple.  No apparent JVD.  RESP:  No IWOB.  Fair aeration bilaterally. CVS:  RRR. Heart sounds normal.  ABD/GI/GU: BS+. Abd soft, NTND.  MSK/EXT:  Moves extremities. No apparent deformity. No edema.  SKIN: no apparent skin lesion or wound NEURO: Awake and alert. Oriented appropriately.  No apparent focal neuro deficit. PSYCH: Calm. Normal affect.   Discharge Instructions Discharge Instructions     Call MD for:  difficulty breathing, headache or visual disturbances   Complete by: As directed    Call MD for:  extreme fatigue   Complete by: As directed    Call MD for:  persistant  dizziness or light-headedness   Complete by: As directed    Diet general   Complete by: As directed    Soft diet.   Discharge instructions   Complete by: As directed    It has been a pleasure taking care of you!  You were hospitalized due to supraventricular tachycardia (fast heart rate),  low blood pressure and gastrointestinal bleeding likely from esophageal cancer.  Your heart rate and blood pressure normalized.  Your bleeding seems to have subsided.  Your hemoglobin remained stable.  Please review your anemia EKG list and the directions on your medications before you take them.   In regards to esophageal cancer, oncology team will call you to arrange outpatient follow-up.   Avoid any over-the-counter pain medication other than plain Tylenol.   Continue soft diet.  Take care,   Increase activity slowly   Complete by: As directed       Allergies as of 05/24/2022   No Known Allergies      Medication List     TAKE these medications    folic acid 1 MG tablet Commonly known as: FOLVITE Take 1  tablet (1 mg total) by mouth daily.   metoprolol succinate 50 MG 24 hr tablet Commonly known as: TOPROL-XL Take 50 mg by mouth daily.   multivitamin with minerals Tabs tablet Take 1 tablet by mouth daily.   nitroGLYCERIN 0.4 MG SL tablet Commonly known as: NITROSTAT Place 0.4 mg under the tongue every 5 (five) minutes as needed for chest pain (May take up to 3 doses.).   pantoprazole 40 MG tablet Commonly known as: PROTONIX Take 1 tablet (40 mg total) by mouth 2 (two) times daily.   pravastatin 40 MG tablet Commonly known as: PRAVACHOL Take 40 mg by mouth at bedtime.               Durable Medical Equipment  (From admission, onward)           Start     Ordered   05/19/22 1552  For home use only DME Walker rolling  Once       Question Answer Comment  Walker: With 5 Inch Wheels   Patient needs a walker to treat with the following condition General weakness       05/19/22 1551            Consultations: Pulmonology Cardiology Gastroenterology Oncology  Procedures/Studies: 1/10-EGD showed a partially obstructing esophageal tumor at the gastroesophageal junction. 1/12-EGD showed partially obstructing esophageal trauma and the lower third of esophagus.  Biopsied. 1/10-1/12-intubation and mechanical ventilation   DG Chest Port 1 View  Result Date: 05/18/2022 CLINICAL DATA:  Status post central line placement. EXAM: PORTABLE CHEST 1 VIEW COMPARISON:  May 17, 2022 FINDINGS: Endotracheal tube is seen with its distal tip approximately 5.5 cm from the carina. A left internal jugular venous catheter is noted with its distal tip near the junction of the superior vena cava and right atrium. The heart size and mediastinal contours are within normal limits. Mild bilateral infrahilar atelectasis is noted. There is no evidence of a pleural effusion or pneumothorax. The visualized skeletal structures are unremarkable. IMPRESSION: 1. Endotracheal tube and left internal jugular venous catheter positioning, as described above. 2. Mild bilateral infrahilar atelectasis. Electronically Signed   By: Virgina Norfolk M.D.   On: 05/18/2022 21:55   CT ANGIO GI BLEED  Result Date: 05/18/2022 CLINICAL DATA:  GI bleeding. EXAM: CTA ABDOMEN AND PELVIS WITHOUT AND WITH CONTRAST TECHNIQUE: Multidetector CT imaging of the abdomen and pelvis was performed using the standard protocol during bolus administration of intravenous contrast. Multiplanar reconstructed images and MIPs were obtained and reviewed to evaluate the vascular anatomy. RADIATION DOSE REDUCTION: This exam was performed according to the departmental dose-optimization program which includes automated exposure control, adjustment of the mA and/or kV according to patient size and/or use of iterative reconstruction technique. CONTRAST:  124m OMNIPAQUE IOHEXOL 350 MG/ML SOLN COMPARISON:  CT abdomen and pelvis  07/10/2020 FINDINGS: VASCULAR Aorta: Normal caliber aorta without aneurysm, dissection, vasculitis or significant stenosis. Are atherosclerotic calcifications of the aorta. Celiac: Patent without evidence of aneurysm, dissection, vasculitis or significant stenosis. SMA: Patent without evidence of aneurysm, dissection, vasculitis or significant stenosis. There is mild noncalcified atherosclerotic disease in the proximal SMA. Renals: Both renal arteries are patent without evidence of aneurysm, dissection, vasculitis, fibromuscular dysplasia or significant stenosis. Accessory left renal artery present. IMA: Patent without evidence of aneurysm, dissection, vasculitis or significant stenosis. Inflow: Patent without evidence of aneurysm, dissection, vasculitis or significant stenosis. Proximal Outflow: Not well opacified secondary to timing of the contrast bolus. Veins: No obvious venous abnormality within the  limitations of this arterial phase study. Review of the MIP images confirms the above findings. NON-VASCULAR Lower chest: There is peribronchial wall thickening and plugging in the right lower lobe with a small amount of airspace disease. Hepatobiliary: There is a 4.8 x 4.1 cm cyst in the inferior right liver. There is an indeterminate rim enhancing lesion in the left lobe of the liver measuring 15 mm seen on image 17/24. There is a second similar appearing lesion in the left lobe of the liver measuring 12 mm image 17/30. Gallbladder and bile ducts are within normal limits. Pancreas: Unremarkable. No pancreatic ductal dilatation or surrounding inflammatory changes. Spleen: Normal in size without focal abnormality. Adrenals/Urinary Tract: Are 2 left renal calculi measuring up 2 9 mm. There is left renal cortical scarring. There is no hydronephrosis in either kidney. There are scattered subcentimeter cortical cysts in both kidneys. The adrenal glands and bladder are within normal limits. Stomach/Bowel: The stomach is  moderately distended. There is an area of active bleeding seen within the proximal stomach near the gastroesophageal junction. Appendix appears normal. No evidence of bowel wall thickening, distention, or inflammatory changes. The appendix is not visualized. Lymphatic: No enlarged lymph nodes are seen. Reproductive: Prostate gland is enlarged. Right-sided hydrocele present. Other: No abdominal wall hernia or abnormality. No abdominopelvic ascites. Musculoskeletal: There is moderate compression deformity of L2 which appears unchanged. IMPRESSION: 1. Active bleeding in the proximal stomach near the gastroesophageal junction. Aortic Atherosclerosis (ICD10-I70.0). NON-VASCULAR 1. Moderate distention of the stomach. 2. New indeterminate rim enhancing lesions in the left lobe of the liver. Recommend further evaluation with MRI. 3. Nonobstructing left renal calculi. 4. Prostatomegaly. 5. Right-sided hydrocele. These results were called by telephone at the time of interpretation on 05/18/2022 at 8:30 pm to provider BRITTON RUST-CHESTER , who verbally acknowledged these results. Electronically Signed   By: Ronney Asters M.D.   On: 05/18/2022 20:33   ECHOCARDIOGRAM COMPLETE  Result Date: 05/18/2022    ECHOCARDIOGRAM REPORT   Patient Name:   Bobby Moreno Date of Exam: 05/18/2022 Medical Rec #:  [id number]   Height:       69.0 in Accession #:    [id number]  Weight:       175.0 lb Date of Birth:  1945/07/16   BSA:          1.952 m Patient Age:    54 years    BP:           121/76 mmHg Patient Gender: M           HR:           101 bpm. Exam Location:  [facility] Procedure: 2D Echo, Cardiac Doppler and Color Doppler Indications:     Syncope R55                  Elevated troponin  History:         Patient has no prior history of Echocardiogram examinations.                  Risk Factors:Hypertension and Dyslipidemia. PSVT.  Sonographer:     Sherrie Sport Referring Phys:  [id number] AMY N COX Diagnosing Phys: Kate Sable MD  Sonographer  Comments: Suboptimal apical window and no subcostal window. IMPRESSIONS  1. Left ventricular ejection fraction, by estimation, is 65 to 70%. The left ventricle has normal function. The left ventricle has no regional wall motion abnormalities. There is mild left ventricular hypertrophy. Left ventricular diastolic parameters are consistent  with Grade I diastolic dysfunction (impaired relaxation).  2. Right ventricular systolic function is normal. The right ventricular size is normal.  3. The mitral valve is normal in structure. No evidence of mitral valve regurgitation.  4. The aortic valve is tricuspid. Aortic valve regurgitation is mild. Aortic valve sclerosis/calcification is present, without any evidence of aortic stenosis.  5. Aortic dilatation noted. There is mild dilatation of the ascending aorta, measuring 42 mm. There is mild dilatation of the aortic root, measuring 39 mm. FINDINGS  Left Ventricle: Left ventricular ejection fraction, by estimation, is 65 to 70%. The left ventricle has normal function. The left ventricle has no regional wall motion abnormalities. The left ventricular internal cavity size was normal in size. There is  mild left ventricular hypertrophy. Left ventricular diastolic parameters are consistent with Grade I diastolic dysfunction (impaired relaxation). Right Ventricle: The right ventricular size is normal. No increase in right ventricular wall thickness. Right ventricular systolic function is normal. Left Atrium: Left atrial size was normal in size. Right Atrium: Right atrial size was normal in size. Pericardium: There is no evidence of pericardial effusion. Mitral Valve: The mitral valve is normal in structure. No evidence of mitral valve regurgitation. Tricuspid Valve: The tricuspid valve is normal in structure. Tricuspid valve regurgitation is trivial. Aortic Valve: The aortic valve is tricuspid. Aortic valve regurgitation is mild. Aortic valve sclerosis/calcification is present,  without any evidence of aortic stenosis. Aortic valve mean gradient measures 3.0 mmHg. Aortic valve peak gradient measures 4.9 mmHg. Aortic valve area, by VTI measures 4.43 cm. Pulmonic Valve: The pulmonic valve was not well visualized. Pulmonic valve regurgitation is not visualized. Aorta: Aortic dilatation noted. There is mild dilatation of the ascending aorta, measuring 42 mm. There is mild dilatation of the aortic root, measuring 39 mm. Venous: The inferior vena cava was not well visualized. IAS/Shunts: No atrial level shunt detected by color flow Doppler.  LEFT VENTRICLE PLAX 2D LVIDd:         3.80 cm   Diastology LVIDs:         2.60 cm   LV e' medial:    9.90 cm/s LV PW:         1.20 cm   LV E/e' medial:  7.0 LV IVS:        1.20 cm   LV e' lateral:   10.60 cm/s LVOT diam:     2.20 cm   LV E/e' lateral: 6.6 LV SV:         67 LV SV Index:   34 LVOT Area:     3.80 cm  RIGHT VENTRICLE RV Basal diam:  2.70 cm RV Mid diam:    2.40 cm RV S prime:     18.00 cm/s TAPSE (M-mode): 2.0 cm LEFT ATRIUM             Index        RIGHT ATRIUM           Index LA diam:        3.40 cm 1.74 cm/m   RA Area:     16.20 cm LA Vol (A2C):   51.2 ml 26.23 ml/m  RA Volume:   41.20 ml  21.11 ml/m LA Vol (A4C):   36.9 ml 18.90 ml/m LA Biplane Vol: 46.6 ml 23.87 ml/m  AORTIC VALVE AV Area (Vmax):    4.08 cm AV Area (Vmean):   4.04 cm AV Area (VTI):     4.43 cm AV Vmax:  111.00 cm/s AV Vmean:          77.000 cm/s AV VTI:            0.152 m AV Peak Grad:      4.9 mmHg AV Mean Grad:      3.0 mmHg LVOT Vmax:         119.00 cm/s LVOT Vmean:        81.800 cm/s LVOT VTI:          0.177 m LVOT/AV VTI ratio: 1.16  AORTA Ao Root diam: 3.94 cm MITRAL VALVE                TRICUSPID VALVE MV Area (PHT): 4.71 cm     TR Peak grad:   12.4 mmHg MV Decel Time: 161 msec     TR Vmax:        176.00 cm/s MV E velocity: 69.70 cm/s MV A velocity: 110.00 cm/s  SHUNTS MV E/A ratio:  0.63         Systemic VTI:  0.18 m                              Systemic Diam: 2.20 cm Kate Sable MD Electronically signed by Kate Sable MD Signature Date/Time: 05/18/2022/2:48:12 PM    Final    DG Chest Port 1 View  Result Date: 05/17/2022 CLINICAL DATA:  Syncope EXAM: PORTABLE CHEST 1 VIEW COMPARISON:  None Available. FINDINGS: The heart size and mediastinal contours are within normal limits. Both lungs are clear. The visualized skeletal structures are unremarkable. IMPRESSION: No active disease. Electronically Signed   By: Ronney Asters M.D.   On: 05/17/2022 22:46       The results of significant diagnostics from this hospitalization (including imaging, microbiology, ancillary and laboratory) are listed below for reference.     Microbiology: Recent Results (from the past 240 hour(s))  Resp panel by RT-PCR (RSV, Flu A&B, Covid) Anterior Nasal Swab     Status: Abnormal   Collection Time: 05/17/22  7:53 PM   Specimen: Anterior Nasal Swab  Result Value Ref Range Status   SARS Coronavirus 2 by RT PCR NEGATIVE NEGATIVE Final    Comment: (NOTE) SARS-CoV-2 target nucleic acids are NOT DETECTED.  The SARS-CoV-2 RNA is generally detectable in upper respiratory specimens during the acute phase of infection. The lowest concentration of SARS-CoV-2 viral copies this assay can detect is 138 copies/mL. A negative result does not preclude SARS-Cov-2 infection and should not be used as the sole basis for treatment or other patient management decisions. A negative result may occur with  improper specimen collection/handling, submission of specimen other than nasopharyngeal swab, presence of viral mutation(s) within the areas targeted by this assay, and inadequate number of viral copies(<138 copies/mL). A negative result must be combined with clinical observations, patient history, and epidemiological information. The expected result is Negative.  Fact Sheet for Patients:  EntrepreneurPulse.com.au  Fact Sheet for Healthcare  Providers:  IncredibleEmployment.be  This test is no t yet approved or cleared by the [place] FDA and  has been authorized for detection and/or diagnosis of SARS-CoV-2 by FDA under an Emergency Use Authorization (EUA). This EUA will remain  in effect (meaning this test can be used) for the duration of the COVID-19 declaration under Section 564(b)(1) of the Act, 21 U.S.C.section 360bbb-3(b)(1), unless the authorization is terminated  or revoked sooner.       Influenza A by  PCR POSITIVE (A) NEGATIVE Final   Influenza B by PCR NEGATIVE NEGATIVE Final    Comment: (NOTE) The Xpert Xpress SARS-CoV-2/FLU/RSV plus assay is intended as an aid in the diagnosis of influenza from Nasopharyngeal swab specimens and should not be used as a sole basis for treatment. Nasal washings and aspirates are unacceptable for Xpert Xpress SARS-CoV-2/FLU/RSV testing.  Fact Sheet for Patients: EntrepreneurPulse.com.au  Fact Sheet for Healthcare Providers: IncredibleEmployment.be  This test is not yet approved or cleared by the [place] FDA and has been authorized for detection and/or diagnosis of SARS-CoV-2 by FDA under an Emergency Use Authorization (EUA). This EUA will remain in effect (meaning this test can be used) for the duration of the COVID-19 declaration under Section 564(b)(1) of the Act, 21 U.S.C. section 360bbb-3(b)(1), unless the authorization is terminated or revoked.     Resp Syncytial Virus by PCR NEGATIVE NEGATIVE Final    Comment: (NOTE) Fact Sheet for Patients: EntrepreneurPulse.com.au  Fact Sheet for Healthcare Providers: IncredibleEmployment.be  This test is not yet approved or cleared by the [place] FDA and has been authorized for detection and/or diagnosis of SARS-CoV-2 by FDA under an Emergency Use Authorization (EUA). This EUA will remain in effect (meaning this test  can be used) for the duration of the COVID-19 declaration under Section 564(b)(1) of the Act, 21 U.S.C. section 360bbb-3(b)(1), unless the authorization is terminated or revoked.  Performed at [facility], [address] [place], [place] [address]   MRSA Next Gen by PCR, Nasal     Status: None   Collection Time: 05/18/22  5:16 PM   Specimen: Nasal Mucosa; Nasal Swab  Result Value Ref Range Status   MRSA by PCR Next Gen NOT DETECTED NOT DETECTED Final    Comment: (NOTE) The GeneXpert MRSA Assay (FDA approved for NASAL specimens only), is one component of a comprehensive MRSA colonization surveillance program. It is not intended to diagnose MRSA infection nor to guide or monitor treatment for MRSA infections. Test performance is not FDA approved in patients less than 60 years old. Performed at [facility], [address] [place], [place] [address]      Labs:  CBC: Recent Labs  Lab 05/17/22 1847 05/18/22 0405 05/20/22 0522 05/20/22 1125 05/21/22 0518 05/21/22 1731 05/22/22 0512 05/23/22 0212 05/24/22 0349  WBC 8.9   < > 4.6  --  6.0  --  6.9 6.4 7.2  NEUTROABS 7.3  --  3.5  --   --   --   --   --   --   HGB 9.8*   < > 8.6*   < > 8.6* 10.3* 10.3* 10.4* 10.3*  HCT 30.8*   < > 26.0*   < > 26.3* 30.7* 30.8* 30.3* 30.5*  MCV 96.3   < > 89.3  --  90.7  --  87.7 86.1 86.9  PLT 255   < > 102*  --  99*  --  138* 192 261   < > = values in this interval not displayed.   BMP &GFR Recent Labs  Lab 05/19/22 0458 05/20/22 0522 05/21/22 0518 05/22/22 0512 05/23/22 0212 05/24/22 0349  NA 138 139 136 133* 135 135  K 4.7 4.0 3.8 3.5 3.7 3.8  CL 109 110 106 105 105 105  CO2 '22 23 22 '$ 21* 21* 24  GLUCOSE 185* 105* 93 89 94 100*  BUN 45* 31* '17 13 10 11  '$ CREATININE 0.97 0.71 0.59* 0.53* 0.66 0.74  CALCIUM 7.5*  7.3* 7.2* 7.3* 7.5* 7.8*  MG 2.4 2.1  --  1.9 2.1 2.2  PHOS 4.6 2.5 2.3* 2.3* 2.9 3.2   Estimated Creatinine Clearance: 81 mL/min (by C-G formula  based on SCr of 0.74 mg/dL). Liver & Pancreas: Recent Labs  Lab 05/17/22 1847 05/19/22 0458 05/21/22 0518 05/22/22 0512 05/23/22 0212 05/24/22 0349  AST 37 34  --   --   --   --   ALT 23 19  --   --   --   --   ALKPHOS 57 34*  --   --   --   --   BILITOT 0.4 0.3  --   --   --   --   PROT 6.3* 4.2*  --   --   --   --   ALBUMIN 3.1* 2.1* 2.2* 2.5* 2.6* 2.7*   No results for input(s): "LIPASE", "AMYLASE" in the last 168 hours. No results for input(s): "AMMONIA" in the last 168 hours. Diabetic: No results for input(s): "HGBA1C" in the last 72 hours. Recent Labs  Lab 05/22/22 1228 05/22/22 1650 05/23/22 0051 05/23/22 0416 05/23/22 0810  GLUCAP 105* 101* 89 88 87   Cardiac Enzymes: No results for input(s): "CKTOTAL", "CKMB", "CKMBINDEX", "TROPONINI" in the last 168 hours. No results for input(s): "PROBNP" in the last 8760 hours. Coagulation Profile: Recent Labs  Lab 05/18/22 2036  INR 1.4*   Thyroid Function Tests: No results for input(s): "TSH", "T4TOTAL", "FREET4", "T3FREE", "THYROIDAB" in the last 72 hours. Lipid Profile: No results for input(s): "CHOL", "HDL", "LDLCALC", "TRIG", "CHOLHDL", "LDLDIRECT" in the last 72 hours. Anemia Panel: Recent Labs    05/24/22 0349  VITAMINB12 3,917*  FOLATE 27.0  FERRITIN 23*  TIBC 242*  IRON 25*  RETICCTPCT 1.6   Urine analysis:    Component Value Date/Time   COLORURINE YELLOW (A) 05/18/2022 2135   APPEARANCEUR HAZY (A) 05/18/2022 2135   LABSPEC >1.046 (H) 05/18/2022 2135   PHURINE 5.0 05/18/2022 2135   GLUCOSEU NEGATIVE 05/18/2022 2135   HGBUR LARGE (A) 05/18/2022 2135   BILIRUBINUR NEGATIVE 05/18/2022 2135   KETONESUR NEGATIVE 05/18/2022 2135   PROTEINUR 100 (A) 05/18/2022 2135   NITRITE NEGATIVE 05/18/2022 2135   LEUKOCYTESUR NEGATIVE 05/18/2022 2135   Sepsis Labs: Invalid input(s): "PROCALCITONIN", "LACTICIDVEN"   SIGNED:  Mercy Riding, MD  [facility] 05/24/2022, 3:16 PM

## 2022-05-26 ENCOUNTER — Telehealth: Payer: Self-pay

## 2022-05-26 NOTE — Telephone Encounter (Signed)
Spoke with Bobby Moreno. He does indeed prefer his care at [facility]. He had a virtual visit yesterday with [employer]. He would like to keep PET scan as scheduled here in [place] and have everything else performed at [facility]. Navigator will sign off.

## 2022-05-26 NOTE — Progress Notes (Signed)
I just got back from vacation- appears patient already seen by [place]

## 2022-06-09 ENCOUNTER — Ambulatory Visit
Admission: RE | Admit: 2022-06-09 | Discharge: 2022-06-09 | Disposition: A | Discharge status: Home / Self Care | Payer: Medicare HMO | Source: Ambulatory Visit | Attending: Oncology | Admitting: Oncology

## 2022-06-09 VITALS — Wt 165.0 lb

## 2022-06-09 DIAGNOSIS — N433 Hydrocele, unspecified: Secondary | ICD-10-CM | POA: Diagnosis not present

## 2022-06-09 DIAGNOSIS — C155 Malignant neoplasm of lower third of esophagus: Secondary | ICD-10-CM

## 2022-06-09 DIAGNOSIS — C7951 Secondary malignant neoplasm of bone: Secondary | ICD-10-CM | POA: Insufficient documentation

## 2022-06-09 DIAGNOSIS — K769 Liver disease, unspecified: Secondary | ICD-10-CM | POA: Diagnosis not present

## 2022-06-09 DIAGNOSIS — R590 Localized enlarged lymph nodes: Secondary | ICD-10-CM | POA: Diagnosis not present

## 2022-06-09 DIAGNOSIS — I2510 Atherosclerotic heart disease of native coronary artery without angina pectoris: Secondary | ICD-10-CM | POA: Diagnosis not present

## 2022-06-09 DIAGNOSIS — C159 Malignant neoplasm of esophagus, unspecified: Secondary | ICD-10-CM | POA: Diagnosis present

## 2022-06-09 DIAGNOSIS — N200 Calculus of kidney: Secondary | ICD-10-CM | POA: Diagnosis not present

## 2022-06-09 DIAGNOSIS — I517 Cardiomegaly: Secondary | ICD-10-CM | POA: Diagnosis not present

## 2022-06-09 DIAGNOSIS — I700 Atherosclerosis of aorta: Secondary | ICD-10-CM | POA: Diagnosis not present

## 2022-06-09 LAB — GLUCOSE, CAPILLARY: Glucose-Capillary: 95 mg/dL (ref 70–99)

## 2022-06-09 MED ORDER — FLUDEOXYGLUCOSE F - 18 (FDG) INJECTION
9.2200 | Freq: Once | INTRAVENOUS | Status: AC | PRN
  Administered 2022-06-09: 9.22 via INTRAVENOUS

## 2022-07-04 ENCOUNTER — Encounter: Payer: Self-pay | Admitting: Radiation Oncology

## 2022-07-04 ENCOUNTER — Ambulatory Visit
Admission: RE | Admit: 2022-07-04 | Discharge: 2022-07-04 | Disposition: A | Discharge status: Home / Self Care | Payer: Medicare HMO | Source: Ambulatory Visit | Attending: Radiation Oncology | Admitting: Radiation Oncology

## 2022-07-04 ENCOUNTER — Telehealth: Payer: Self-pay

## 2022-07-04 VITALS — BP 129/77 | HR 94 | Temp 98.1°F | Resp 16 | Ht 69.0 in | Wt 168.0 lb

## 2022-07-04 DIAGNOSIS — C787 Secondary malignant neoplasm of liver and intrahepatic bile duct: Secondary | ICD-10-CM | POA: Diagnosis not present

## 2022-07-04 DIAGNOSIS — I471 Supraventricular tachycardia, unspecified: Secondary | ICD-10-CM | POA: Insufficient documentation

## 2022-07-04 DIAGNOSIS — I10 Essential (primary) hypertension: Secondary | ICD-10-CM | POA: Diagnosis not present

## 2022-07-04 DIAGNOSIS — Z8042 Family history of malignant neoplasm of prostate: Secondary | ICD-10-CM | POA: Insufficient documentation

## 2022-07-04 DIAGNOSIS — Z87891 Personal history of nicotine dependence: Secondary | ICD-10-CM | POA: Insufficient documentation

## 2022-07-04 DIAGNOSIS — E785 Hyperlipidemia, unspecified: Secondary | ICD-10-CM | POA: Insufficient documentation

## 2022-07-04 DIAGNOSIS — C7951 Secondary malignant neoplasm of bone: Secondary | ICD-10-CM | POA: Insufficient documentation

## 2022-07-04 DIAGNOSIS — C155 Malignant neoplasm of lower third of esophagus: Secondary | ICD-10-CM | POA: Insufficient documentation

## 2022-07-04 DIAGNOSIS — Z79899 Other long term (current) drug therapy: Secondary | ICD-10-CM | POA: Diagnosis not present

## 2022-07-04 DIAGNOSIS — C159 Malignant neoplasm of esophagus, unspecified: Secondary | ICD-10-CM

## 2022-07-04 NOTE — Consult Note (Signed)
NEW PATIENT EVALUATION  Name: Bobby Moreno  MRN: [id number]  Date:   07/04/2022     DOB: [birth year]   This 77 y.o. 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  REFERRING PHYSICIAN: Zeb Comfort, MD  CHIEF COMPLAINT:  Chief Complaint  Patient presents with   Esophageal Cancer    DIAGNOSIS: The encounter diagnosis was Esophageal adenocarcinoma ([place]).   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 for enrollment of thSTAR-221 combination anti-PD-1 anti-TI GIT immunotherapy clinical trial.  He originally presented with increasing dysphagia GI bleed.  He has been seen by medical oncology as well as radiation oncology at [facility].  He presented in early January with a GI bleed was found to have a near obstructing G gastroesophageal junction lesion with enhancing lesion in the left lower lobe of the liver.  Upper endoscopy showed invasive moderately differentiated adenocarcinoma in a background of high-grade dysplasia.  Mass was partially obstructing.  PET scan demonstrated hypermetabolic activity the GE junction consistent with the known malignancy.  Also had small cluster of right gastric lymph nodes 3 hypermetabolic lesions in the liver small hypermetabolic osseous metastatic lesions in the right proximal humerus and T3 vertebral body.  He has been seen by medical oncology at [facility] and is being screened for immunotherapy trial as stated aboveSTAr 221.  He is doing fairly well he specifically denies bone pain at this time continues to have some dysphagia.  He is being evaluated by the protocol team to see if radiation would interfere with his enrollment in this protocol.  PLANNED  TREATMENT REGIMEN: Palliative radiation therapy to his distal esophagus  PAST MEDICAL HISTORY:  has a past medical history of HLD (hyperlipidemia), HTN (hypertension), and PSVT (paroxysmal supraventricular tachycardia).    PAST SURGICAL HISTORY:  Past Surgical History:  Procedure Laterality Date   CATARACT EXTRACTION, BILATERAL  2010   ESOPHAGOGASTRODUODENOSCOPY N/A 05/18/2022   Procedure: ESOPHAGOGASTRODUODENOSCOPY (EGD);  Surgeon: Lin Landsman, MD;  Location: [facility] ENDOSCOPY;  Service: Gastroenterology;  Laterality: N/A;   ESOPHAGOGASTRODUODENOSCOPY (EGD) WITH PROPOFOL N/A 05/20/2022   Procedure: ESOPHAGOGASTRODUODENOSCOPY (EGD) WITH PROPOFOL;  Surgeon: [name], MD;  Location[facility] ENDOSCOPY;  Service: Gastroenterology;  Laterality: N/A;    FAMILY HISTORY: family history includes Dementia in his mother; Emphysema in his father; Hypertension in his mother; Prostate cancer in his father.  SOCIAL HISTORY:  reports that he has quit smoking. He has never used smokeless tobacco. He reports current alcohol use of about 7.0 standard drinks of alcohol per week. He reports that he does not use drugs.  ALLERGIES: Patient has no known allergies.  MEDICATIONS:  Current Outpatient Medications  Medication Sig Dispense Refill   metoprolol succinate (TOPROL-XL) 50 MG 24 hr tablet Take 50 mg by mouth daily.     Multiple Vitamin (MULTIVITAMIN WITH MINERALS) TABS tablet Take 1 tablet by mouth daily.     nitroGLYCERIN (NITROSTAT) 0.4 MG SL tablet Place 0.4 mg under the tongue every 5 (five) minutes as needed for chest pain (May take up to 3 doses.).     pantoprazole (PROTONIX) 40 MG tablet Take 1 tablet (40 mg total) by mouth 2 (two) times daily. 180 tablet 0   pravastatin (PRAVACHOL) 40 MG tablet  Take 40 mg by mouth at bedtime.     No current facility-administered medications for this encounter.    ECOG PERFORMANCE STATUS:  1 - Symptomatic but completely ambulatory  REVIEW OF  SYSTEMS: Patient denies any weight loss, fatigue, weakness, fever, chills or night sweats. Patient denies any loss of vision, blurred vision. Patient denies any ringing  of the ears or hearing loss. No irregular heartbeat. Patient denies heart murmur or history of fainting. Patient denies any chest pain or pain radiating to her upper extremities. Patient denies any shortness of breath, difficulty breathing at night, cough or hemoptysis. Patient denies any swelling in the lower legs. Patient denies any nausea vomiting, vomiting of blood, or coffee ground material in the vomitus. Patient denies any stomach pain. Patient states has had normal bowel movements no significant constipation or diarrhea. Patient denies any dysuria, hematuria or significant nocturia. Patient denies any problems walking, swelling in the joints or loss of balance. Patient denies any skin changes, loss of hair or loss of weight. Patient denies any excessive worrying or anxiety or significant depression. Patient denies any problems with insomnia. Patient denies excessive thirst, polyuria, polydipsia. Patient denies any swollen glands, patient denies easy bruising or easy bleeding. Patient denies any recent infections, allergies or URI. Patient "s visual fields have not changed significantly in recent time.   PHYSICAL EXAM: BP 129/77 (BP Location: Left Arm, Patient Position: Sitting, Cuff Size: Normal)   Pulse 94   Temp 98.1 F (36.7 C) (Tympanic)   Resp 16   Ht '5\' 9"'$  (1.753 m)   Wt 168 lb (76.2 kg)   BMI 24.81 kg/m  Well-developed well-nourished patient in NAD. HEENT reveals PERLA, EOMI, discs not visualized.  Oral cavity is clear. No oral mucosal lesions are identified. Neck is clear without evidence of cervical or supraclavicular adenopathy. Lungs are clear to A&P. Cardiac examination is essentially unremarkable with regular rate and rhythm without murmur rub or thrill. Abdomen is benign with no organomegaly or masses noted. Motor  sensory and DTR levels are equal and symmetric in the upper and lower extremities. Cranial nerves II through XII are grossly intact. Proprioception is intact. No peripheral adenopathy or edema is identified. No motor or sensory levels are noted. Crude visual fields are within normal range.  LABORATORY DATA: Pathology reports reviewed    RADIOLOGY RESULTS: PET CT scan reviewed compatible with above-stated findings   IMPRESSION: Stage IV GE junction adenocarcinoma and 77 year old male for consideration of palliative radiation  PLAN: At this time of gotten our GI nurse coordinator involved.  She will try to nail down with [facility] whether he will be disqualified from this trial of immunotherapy if he receives radiation.  I have tentatively set up simulation for later this week.  Would plan on delivering 3000 cGy in 15 fractions using 3-dimensional treatment planning.  Risks and benefits of treatment including potential nausea skin reaction fatigue alteration of blood counts all were discussed in detail with the patient and his wife.  They seem to comprehend my recommendations well.  I would like to take this opportunity to thank you for allowing me to participate in the care of your patient.Noreene Filbert, MD

## 2022-07-04 NOTE — Telephone Encounter (Signed)
Reached out to clinical trial team at [facility] to ensure radiation will not interfere with start of clinical trial. Awaiting response.

## 2022-07-05 ENCOUNTER — Other Ambulatory Visit: Payer: Self-pay

## 2022-07-05 ENCOUNTER — Inpatient Hospital Stay
Admission: EM | Admit: 2022-07-05 | Discharge: 2022-07-07 | DRG: 375 | Disposition: A | Discharge status: Home / Self Care | Payer: Medicare HMO | Attending: Hospitalist | Admitting: Hospitalist

## 2022-07-05 ENCOUNTER — Emergency Department: Payer: Medicare HMO

## 2022-07-05 ENCOUNTER — Telehealth: Payer: Self-pay

## 2022-07-05 DIAGNOSIS — Z9049 Acquired absence of other specified parts of digestive tract: Secondary | ICD-10-CM | POA: Diagnosis not present

## 2022-07-05 DIAGNOSIS — I110 Hypertensive heart disease with heart failure: Secondary | ICD-10-CM | POA: Diagnosis present

## 2022-07-05 DIAGNOSIS — R7989 Other specified abnormal findings of blood chemistry: Secondary | ICD-10-CM | POA: Diagnosis present

## 2022-07-05 DIAGNOSIS — D72829 Elevated white blood cell count, unspecified: Secondary | ICD-10-CM | POA: Diagnosis present

## 2022-07-05 DIAGNOSIS — I10 Essential (primary) hypertension: Secondary | ICD-10-CM

## 2022-07-05 DIAGNOSIS — Z8249 Family history of ischemic heart disease and other diseases of the circulatory system: Secondary | ICD-10-CM

## 2022-07-05 DIAGNOSIS — K2289 Other specified disease of esophagus: Secondary | ICD-10-CM

## 2022-07-05 DIAGNOSIS — K922 Gastrointestinal hemorrhage, unspecified: Secondary | ICD-10-CM | POA: Diagnosis present

## 2022-07-05 DIAGNOSIS — Z79899 Other long term (current) drug therapy: Secondary | ICD-10-CM | POA: Diagnosis not present

## 2022-07-05 DIAGNOSIS — D62 Acute posthemorrhagic anemia: Secondary | ICD-10-CM | POA: Diagnosis present

## 2022-07-05 DIAGNOSIS — E861 Hypovolemia: Secondary | ICD-10-CM | POA: Diagnosis present

## 2022-07-05 DIAGNOSIS — Z66 Do not resuscitate: Secondary | ICD-10-CM | POA: Diagnosis present

## 2022-07-05 DIAGNOSIS — I959 Hypotension, unspecified: Secondary | ICD-10-CM | POA: Diagnosis present

## 2022-07-05 DIAGNOSIS — D509 Iron deficiency anemia, unspecified: Secondary | ICD-10-CM | POA: Diagnosis present

## 2022-07-05 DIAGNOSIS — I5032 Chronic diastolic (congestive) heart failure: Secondary | ICD-10-CM | POA: Diagnosis present

## 2022-07-05 DIAGNOSIS — K921 Melena: Secondary | ICD-10-CM | POA: Diagnosis present

## 2022-07-05 DIAGNOSIS — Z87891 Personal history of nicotine dependence: Secondary | ICD-10-CM | POA: Diagnosis not present

## 2022-07-05 DIAGNOSIS — E785 Hyperlipidemia, unspecified: Secondary | ICD-10-CM | POA: Diagnosis present

## 2022-07-05 DIAGNOSIS — C159 Malignant neoplasm of esophagus, unspecified: Secondary | ICD-10-CM | POA: Diagnosis present

## 2022-07-05 DIAGNOSIS — R55 Syncope and collapse: Secondary | ICD-10-CM | POA: Diagnosis present

## 2022-07-05 DIAGNOSIS — C155 Malignant neoplasm of lower third of esophagus: Secondary | ICD-10-CM | POA: Diagnosis present

## 2022-07-05 DIAGNOSIS — Z85038 Personal history of other malignant neoplasm of large intestine: Secondary | ICD-10-CM | POA: Diagnosis not present

## 2022-07-05 LAB — COMPREHENSIVE METABOLIC PANEL
ALT: 14 U/L (ref 0–44)
AST: 22 U/L (ref 15–41)
Albumin: 2.4 g/dL — ABNORMAL LOW (ref 3.5–5.0)
Alkaline Phosphatase: 57 U/L (ref 38–126)
Anion gap: 9 (ref 5–15)
BUN: 38 mg/dL — ABNORMAL HIGH (ref 8–23)
CO2: 19 mmol/L — ABNORMAL LOW (ref 22–32)
Calcium: 7.5 mg/dL — ABNORMAL LOW (ref 8.9–10.3)
Chloride: 109 mmol/L (ref 98–111)
Creatinine, Ser: 0.77 mg/dL (ref 0.61–1.24)
GFR, Estimated: >60 mL/min (ref >60)
Glucose, Bld: 200 mg/dL — ABNORMAL HIGH (ref 70–99)
Potassium: 4.6 mmol/L (ref 3.5–5.1)
Sodium: 137 mmol/L (ref 135–145)
Total Bilirubin: 0.4 mg/dL (ref 0.3–1.2)
Total Protein: 4.7 g/dL — ABNORMAL LOW (ref 6.5–8.1)

## 2022-07-05 LAB — CBC
HCT: 26.5 % — ABNORMAL LOW (ref 39.0–52.0)
HCT: 26.2 % — ABNORMAL LOW (ref 39.0–52.0)
Hemoglobin: 8.8 g/dL — ABNORMAL LOW (ref 13.0–17.0)
Hemoglobin: 8.5 g/dL — ABNORMAL LOW (ref 13.0–17.0)
MCH: 29.2 pg (ref 26.0–34.0)
MCH: 28.6 pg (ref 26.0–34.0)
MCHC: 33.2 g/dL (ref 30.0–36.0)
MCHC: 32.4 g/dL (ref 30.0–36.0)
MCV: 88 fL (ref 80.0–100.0)
MCV: 88.2 fL (ref 80.0–100.0)
Platelets: 236 10*3/uL (ref 150–400)
Platelets: 229 10*3/uL (ref 150–400)
RBC: 3.01 MIL/uL — ABNORMAL LOW (ref 4.22–5.81)
RBC: 2.97 MIL/uL — ABNORMAL LOW (ref 4.22–5.81)
RDW: 15.5 % (ref 11.5–15.5)
RDW: 15.6 % — ABNORMAL HIGH (ref 11.5–15.5)
WBC: 10.4 10*3/uL (ref 4.0–10.5)
WBC: 9.8 10*3/uL (ref 4.0–10.5)
nRBC: 0 % (ref 0.0–0.2)
nRBC: 0 % (ref 0.0–0.2)

## 2022-07-05 LAB — CBC WITH DIFFERENTIAL/PLATELET
Abs Immature Granulocytes: 0.08 10*3/uL — ABNORMAL HIGH (ref 0.00–0.07)
Basophils Absolute: 0 10*3/uL (ref 0.0–0.1)
Basophils Relative: 0 %
Eosinophils Absolute: 0 10*3/uL (ref 0.0–0.5)
Eosinophils Relative: 0 %
HCT: 21.1 % — ABNORMAL LOW (ref 39.0–52.0)
Hemoglobin: 6.5 g/dL — ABNORMAL LOW (ref 13.0–17.0)
Immature Granulocytes: 1 %
Lymphocytes Relative: 10 %
Lymphs Abs: 1.4 10*3/uL (ref 0.7–4.0)
MCH: 29.3 pg (ref 26.0–34.0)
MCHC: 30.8 g/dL (ref 30.0–36.0)
MCV: 95 fL (ref 80.0–100.0)
Monocytes Absolute: 0.9 10*3/uL (ref 0.1–1.0)
Monocytes Relative: 6 %
Neutro Abs: 12.2 10*3/uL — ABNORMAL HIGH (ref 1.7–7.7)
Neutrophils Relative %: 83 %
Platelets: 321 10*3/uL (ref 150–400)
RBC: 2.22 MIL/uL — ABNORMAL LOW (ref 4.22–5.81)
RDW: 14.7 % (ref 11.5–15.5)
WBC: 14.7 10*3/uL — ABNORMAL HIGH (ref 4.0–10.5)
nRBC: 0 % (ref 0.0–0.2)

## 2022-07-05 LAB — MRSA NEXT GEN BY PCR, NASAL: MRSA by PCR Next Gen: NOT DETECTED

## 2022-07-05 LAB — BRAIN NATRIURETIC PEPTIDE: B Natriuretic Peptide: 35.4 pg/mL (ref 0.0–100.0)

## 2022-07-05 LAB — GLUCOSE, CAPILLARY
Glucose-Capillary: 154 mg/dL — ABNORMAL HIGH (ref 70–99)
Glucose-Capillary: 108 mg/dL — ABNORMAL HIGH (ref 70–99)

## 2022-07-05 LAB — PROTIME-INR
INR: 1.2 (ref 0.8–1.2)
Prothrombin Time: 15.3 seconds — ABNORMAL HIGH (ref 11.4–15.2)

## 2022-07-05 LAB — APTT: aPTT: 28 seconds (ref 24–36)

## 2022-07-05 LAB — LACTIC ACID, PLASMA
Lactic Acid, Venous: 3.2 mmol/L (ref 0.5–1.9)
Lactic Acid, Venous: 2.6 mmol/L (ref 0.5–1.9)

## 2022-07-05 LAB — PREPARE RBC (CROSSMATCH)

## 2022-07-05 LAB — TROPONIN I (HIGH SENSITIVITY)
Troponin I (High Sensitivity): 13 ng/L (ref <18)
Troponin I (High Sensitivity): 11 ng/L (ref <18)

## 2022-07-05 LAB — LIPASE, BLOOD: Lipase: 37 U/L (ref 11–51)

## 2022-07-05 MED ORDER — SODIUM CHLORIDE 0.9 % IV SOLN: 10.0000 mL/h | Freq: Once | INTRAVENOUS | Status: DC

## 2022-07-05 MED ORDER — SODIUM CHLORIDE 0.9 % IV SOLN
200.0000 mg | Freq: Once | INTRAVENOUS | Status: AC
  Administered 2022-07-05: 200 mg via INTRAVENOUS
  Filled 2022-07-05: qty 200

## 2022-07-05 MED ORDER — ONDANSETRON HCL 4 MG/2ML IJ SOLN
4.0000 mg | Freq: Once | INTRAMUSCULAR | Status: AC
  Administered 2022-07-05: 4 mg via INTRAVENOUS
  Filled 2022-07-05: qty 2

## 2022-07-05 MED ORDER — ORAL CARE MOUTH RINSE: 15.0000 mL | OROMUCOSAL | Status: DC | PRN

## 2022-07-05 MED ORDER — PANTOPRAZOLE SODIUM 40 MG IV SOLR
40.0000 mg | Freq: Two times a day (BID) | INTRAVENOUS | Status: DC
  Administered 2022-07-05 – 2022-07-06 (×3): 40 mg via INTRAVENOUS
  Filled 2022-07-05 (×3): qty 10

## 2022-07-05 MED ORDER — CHLORHEXIDINE GLUCONATE CLOTH 2 % EX PADS
6.0000 | MEDICATED_PAD | Freq: Every day | CUTANEOUS | Status: DC
  Administered 2022-07-06: 6 via TOPICAL

## 2022-07-05 MED ORDER — ONDANSETRON HCL 4 MG/2ML IJ SOLN: 4.0000 mg | Freq: Three times a day (TID) | INTRAMUSCULAR | Status: DC | PRN

## 2022-07-05 MED ORDER — FERROUS SULFATE 325 (65 FE) MG PO TABS
325.0000 mg | ORAL_TABLET | Freq: Two times a day (BID) | ORAL | Status: DC
  Administered 2022-07-06: 325 mg via ORAL
  Filled 2022-07-05: qty 1

## 2022-07-05 MED ORDER — ACETAMINOPHEN 325 MG PO TABS: 650.0000 mg | ORAL_TABLET | Freq: Four times a day (QID) | ORAL | Status: DC | PRN

## 2022-07-05 MED ORDER — PRAVASTATIN SODIUM 20 MG PO TABS
40.0000 mg | ORAL_TABLET | Freq: Every day | ORAL | Status: DC
  Administered 2022-07-05 – 2022-07-06 (×2): 40 mg via ORAL
  Filled 2022-07-05 (×2): qty 2

## 2022-07-05 MED ORDER — ADULT MULTIVITAMIN W/MINERALS CH
1.0000 | ORAL_TABLET | Freq: Every day | ORAL | Status: DC
  Administered 2022-07-05: 1 via ORAL
  Filled 2022-07-05: qty 1

## 2022-07-05 MED ORDER — ZOLPIDEM TARTRATE 5 MG PO TABS
5.0000 mg | ORAL_TABLET | Freq: Every evening | ORAL | Status: DC | PRN
  Administered 2022-07-05 – 2022-07-06 (×2): 5 mg via ORAL
  Filled 2022-07-05 (×2): qty 1

## 2022-07-05 MED ORDER — PANTOPRAZOLE SODIUM 40 MG IV SOLR
40.0000 mg | Freq: Once | INTRAVENOUS | Status: AC
  Administered 2022-07-05: 40 mg via INTRAVENOUS
  Filled 2022-07-05: qty 10

## 2022-07-05 MED ORDER — SODIUM CHLORIDE 0.9 % IV SOLN: INTRAVENOUS | Status: DC

## 2022-07-05 NOTE — Plan of Care (Signed)
Pt arrives from home via EMS for syncope x2. Upon EMS arrival pt's BP 74/43. Pt received 2L NS en route. Pt arrives alert and conversive. Pt has bruise to L forehead. No hx of blood thinner.    Case discussed with Dr Charna Archer  Recommend adequate blood transfusions and re-assessment of BP's in several hours before ICU assesses for admission.  BP 101/68   Pulse (!) 118   Temp 98.3 F (36.8 C) (Oral)   Resp 20   SpO2 100%    BP seems to be improving, will assess patient later this AM.

## 2022-07-05 NOTE — ED Provider Notes (Signed)
[facility] Provider Note    Event Date/Time   First MD Initiated Contact with Patient 07/05/22 [phone]     (approximate)   History   Chief Complaint Loss of Consciousness   HPI  Bobby Moreno is a 77 y.o. male with past medical history of hypertension, hyperlipidemia, SVT, anemia, and esophageal cancer who presents to the ED complaining of syncope.  Patient reports that he was feeling well when he went to bed last night, then woke up in the middle of the night feeling like he needed to have a bowel movement.  He stood up to go to the bathroom, then felt very dizzy and lightheaded, ended up passing out and hitting his head.  His wife was gradually able to get him up off of the floor and to the bathroom, where he states he had a dark bowel movement.  He states it did not seem dark enough to contain blood and he did not notice any bright red blood.  He went back to bed but again woke up around 4 AM feeling a need to have a bowel movement, felt very lightheaded when he attempted to get up, like he was going to pass out again.  He laid back down in bed and family eventually called EMS.  EMS reports that patient syncopized as they were trying to transition him from bed to stretcher.  He was noted to be hypotensive with initial BP in the [id number] systolic.  He was given 2 L IV fluid bolus with improvement into the [id number] systolic.  Patient does report some upper abdominal discomfort since the syncopal episodes, denies any chest pain or shortness of breath.  He has an abrasion to his frontal scalp from the fall, denies any headache, neck pain, or pain in his extremities.     Physical Exam   Triage Vital Signs: ED Triage Vitals  Enc Vitals Group     BP 07/05/22 0713 123/73     Pulse Rate 07/05/22 0713 (!) 113     Resp 07/05/22 0713 (!) 22     Temp 07/05/22 0713 97.6 F (36.4 C)     Temp Source 07/05/22 0713 Oral     SpO2 07/05/22 0713 98 %     Weight --      Height --       Head Circumference --      Peak Flow --      Pain Score 07/05/22 0719 4     Pain Loc --      Pain Edu? --      Excl. in [place]? --     Most recent vital signs: Vitals:   07/05/22 1000 07/05/22 1017  BP: 110/73 111/77  Pulse: (!) 101 (!) 106  Resp: 19 19  Temp:  98.4 F (36.9 C)  SpO2: 100% 100%    Constitutional: Alert and oriented, pale appearing. Eyes: Conjunctivae are normal. Head: Atraumatic. Nose: No congestion/rhinnorhea. Mouth/Throat: Mucous membranes are moist.  Neck: No midline cervical spine tenderness to palpation, able to rotate neck to 45 degrees bilaterally without pain. Cardiovascular: Tachycardic, regular rhythm. Grossly normal heart sounds.  2+ radial pulses bilaterally. Respiratory: Normal respiratory effort.  No retractions. Lungs CTAB. Gastrointestinal: Soft and nontender. No distention.  Rectal exam with dark brown guaiac positive stool. Musculoskeletal: No lower extremity tenderness nor edema.  Neurologic:  Normal speech and language. No gross focal neurologic deficits are appreciated.    ED Results / Procedures / Treatments   Labs (  all labs ordered are listed, but only abnormal results are displayed) Labs Reviewed  CBC WITH DIFFERENTIAL/PLATELET - Abnormal; Notable for the following components:      Result Value   WBC 14.7 (*)    RBC 2.22 (*)    Hemoglobin 6.5 (*)    HCT 21.1 (*)    Neutro Abs 12.2 (*)    Abs Immature Granulocytes 0.08 (*)    All other components within normal limits  COMPREHENSIVE METABOLIC PANEL - Abnormal; Notable for the following components:   CO2 19 (*)    Glucose, Bld 200 (*)    BUN 38 (*)    Calcium 7.5 (*)    Total Protein 4.7 (*)    Albumin 2.4 (*)    All other components within normal limits  LACTIC ACID, PLASMA - Abnormal; Notable for the following components:   Lactic Acid, Venous 3.2 (*)    All other components within normal limits  LIPASE, BLOOD  LACTIC ACID, PLASMA  BRAIN NATRIURETIC PEPTIDE  PROTIME-INR   APTT  CBC  CBC  CBC  TYPE AND SCREEN  PREPARE RBC (CROSSMATCH)  TROPONIN I (HIGH SENSITIVITY)  TROPONIN I (HIGH SENSITIVITY)     EKG  ED ECG REPORT I, Blake Divine, the attending physician, personally viewed and interpreted this ECG.   Date: 07/05/2022  EKG Time: 7:44  Rate: 117  Rhythm: sinus tachycardia  Axis: Normal  Intervals:none  ST&T Change: None  RADIOLOGY CT head reviewed and interpreted by me with no hemorrhage or midline shift.  PROCEDURES:  Critical Care performed: Yes, see critical care procedure note(s)  .Critical Care  Performed by: Blake Divine, MD Authorized by: Blake Divine, MD   Critical care provider statement:    Critical care time (minutes):  30   Critical care time was exclusive of:  Separately billable procedures and treating other patients and teaching time   Critical care was necessary to treat or prevent imminent or life-threatening deterioration of the following conditions: Upper GI bleed.   Critical care was time spent personally by me on the following activities:  Development of treatment plan with patient or surrogate, discussions with consultants, evaluation of patient's response to treatment, examination of patient, ordering and review of laboratory studies, ordering and review of radiographic studies, ordering and performing treatments and interventions, pulse oximetry, re-evaluation of patient's condition and review of old charts   I assumed direction of critical care for this patient from another provider in my specialty: no     Care discussed with: admitting provider      MEDICATIONS ORDERED IN ED: Medications  0.9 %  sodium chloride infusion (has no administration in time range)  0.9 %  sodium chloride infusion (has no administration in time range)  pantoprazole (PROTONIX) injection 40 mg (has no administration in time range)  ondansetron (ZOFRAN) injection 4 mg (has no administration in time range)  acetaminophen  (TYLENOL) tablet 650 mg (has no administration in time range)  ondansetron (ZOFRAN) injection 4 mg (4 mg Intravenous Given 07/05/22 0801)  pantoprazole (PROTONIX) injection 40 mg (40 mg Intravenous Given 07/05/22 0801)     IMPRESSION / MDM / [place] / ED COURSE  I reviewed the triage vital signs and the nursing notes.                              77 y.o. male with past medical history of hypertension, hyperlipidemia, SVT, anemia, and esophageal cancer  who presents to the ED complaining of dizziness and multiple syncopal episodes at home while attempting to have bowel movements overnight.  Patient's presentation is most consistent with acute presentation with potential threat to life or bodily function.  Differential diagnosis includes, but is not limited to, arrhythmia, ACS, anemia, electrolyte abnormality, AKI, GI bleed, vasovagal episode, orthostatic hypotension.  Patient pale and ill-appearing, but in no acute distress, vital signs remarkable for tachycardia and mild tachypnea but with reassuring blood pressure at 123/73 at this time.  Rectal exam shows dark brown guaiac positive stool.  Reviewing patient's chart, he required admission in January for hemorrhagic shock secondary to bleeding from his esophageal mass, presented with syncope at that time.  I am concerned he is dealing with recurrent bleeding and we will give dose of IV Protonix, sent for type and screen.  We will hold off on additional IV fluids given concern for dilution, CBC pending at this time.  Lower suspicion for cardiac etiology for syncope, will observe on cardiac monitor and screen troponin.  CBC shows significant anemia with hemoglobin of 6.5, plan to transfuse 2 units PRBCs.  He remains tachycardic but blood pressure is stable, case discussed with Dr. Vicente Males of GI, who will plan for endoscopy once patient stabilized with transfusion.  Patient with elevated BUN to creatinine ratio concerning for upper GI bleed, no  AKI or electrolyte abnormality noted.  Troponin within normal limits.  Case discussed with Dr. Mortimer Fries in the ICU, who will evaluate the patient but feels he is currently stable for admission to stepdown.  Case discussed with hospitalist for admission.      FINAL CLINICAL IMPRESSION(S) / ED DIAGNOSES   Final diagnoses:  Upper GI bleed  Esophageal mass     Rx / DC Orders   ED Discharge Orders     None        Note:  This document was prepared using Dragon voice recognition software and may include unintentional dictation errors.   Blake Divine, MD 07/05/22 1031

## 2022-07-05 NOTE — Consult Note (Addendum)
[name] , MD [address] [place], [place], [place], [address] [address], [place], [place], [place], [address] Phone: [phone]  Fax: [phone]  Consultation  Referring Provider:    Dr Charna Archer Primary Care Physician:  Zeb Comfort, MD Primary Gastroenterologist:  Dr. Vicente Males         Reason for Consultation:     GI bleed  Date of Admission:  07/05/2022 Date of Consultation:  07/05/2022         HPI:   Bobby Moreno is a 77 y.o. male who has a recent diagnosis of stage IV gastroesophageal adenocarcinoma.  Also history of colon cancer in 2022 with resection of the terminal ileum appendix and cecum.  He woke up in the middle of the night feeling like he had to have a bowel movement felt dizzy lightheaded ended up passing out and hitting his head and had a few dark bowel movements subsequently.  Eventually called EMS as he was feeling very lightheaded in the ER was hypotensive with systolic blood pressure in the 70s.  Denies any hematemesis, no bowel movements since coming into the hospital. Not yet started treatment at [facility] for the esophageal cancer.    Baseline hemoglobin 1 month back was 10 g on admission 6.5 g Elevated BUN/creatinine ratio with a BUN of 38 Past Medical History:  Diagnosis Date   HLD (hyperlipidemia)    HTN (hypertension)    PSVT (paroxysmal supraventricular tachycardia)     Past Surgical History:  Procedure Laterality Date   CATARACT EXTRACTION, BILATERAL  2010   ESOPHAGOGASTRODUODENOSCOPY N/A 05/18/2022   Procedure: ESOPHAGOGASTRODUODENOSCOPY (EGD);  Surgeon: Lin Landsman, MD;  Location: [facility] ENDOSCOPY;  Service: Gastroenterology;  Laterality: N/A;   ESOPHAGOGASTRODUODENOSCOPY (EGD) WITH PROPOFOL N/A 05/20/2022   Procedure: ESOPHAGOGASTRODUODENOSCOPY (EGD) WITH PROPOFOL;  Surgeon: [name], MD;  Location: [facility] ENDOSCOPY;  Service: Gastroenterology;  Laterality: N/A;    Prior to Admission medications   Medication Sig Start Date End  Date Taking? Authorizing Provider  metoprolol succinate (TOPROL-XL) 50 MG 24 hr tablet Take 50 mg by mouth daily. 04/27/22 04/27/23  [provider]  Multiple Vitamin (MULTIVITAMIN WITH MINERALS) TABS tablet Take 1 tablet by mouth daily. 05/24/22   Mercy Riding, MD  nitroGLYCERIN (NITROSTAT) 0.4 MG SL tablet Place 0.4 mg under the tongue every 5 (five) minutes as needed for chest pain (May take up to 3 doses.).    [provider]  pantoprazole (PROTONIX) 40 MG tablet Take 1 tablet (40 mg total) by mouth 2 (two) times daily. 05/24/22 08/22/22  Mercy Riding, MD  pravastatin (PRAVACHOL) 40 MG tablet Take 40 mg by mouth at bedtime. 04/27/22   [provider]    Family History  Problem Relation Age of Onset   Hypertension Mother    Dementia Mother    Prostate cancer Father    Emphysema Father      Social History   Tobacco Use   Smoking status: Former   Smokeless tobacco: Never  Substance Use Topics   Alcohol use: Yes    Alcohol/week: 7.0 standard drinks of alcohol    Types: 7 Cans of beer per week   Drug use: Never    Allergies as of 07/05/2022   (No Known Allergies)    Review of Systems:    All systems reviewed and negative except where noted in HPI.   Physical Exam:  Vital signs in last 24 hours: Temp:  [97.6 F (36.4 C)-98.4 F (36.9 C)]  98 F (36.7 C) (02/27 1221) Pulse Rate:  [93-134] 103 (02/27 1300) Resp:  [12-24] 16 (02/27 1221) BP: (89-137)/(63-79) 137/68 (02/27 1300) SpO2:  [95 %-100 %] 98 % (02/27 1300) Weight:  [73.7 kg] 73.7 kg (02/27 1221) Last BM Date : 07/05/22 General:   Pleasant, cooperative in NAD Head:  Normocephalic and atraumatic. Eyes:   No icterus.   Conjunctiva pink. PERRLA. Ears:  Normal auditory acuity. Neck:  Supple; no masses or thyroidomegaly Lungs: Respirations even and unlabored. Lungs clear to auscultation bilaterally.   No wheezes, crackles, or rhonchi.  Heart:  Regular rate and rhythm;  Without murmur,  clicks, rubs or gallops Abdomen:  Soft, nondistended, nontender. Normal bowel sounds. No appreciable masses or hepatomegaly.  No rebound or guarding.  Neurologic:  Alert and oriented x3;  grossly normal neurologically. Skin:  Intact without significant lesions or rashes. Cervical Nodes:  No significant cervical adenopathy. Psych:  Alert and cooperative. Normal affect.  LAB RESULTS: Recent Labs    07/05/22 0728  WBC 14.7*  HGB 6.5*  HCT 21.1*  PLT 321   BMET Recent Labs    07/05/22 0728  NA 137  K 4.6  CL 109  CO2 19*  GLUCOSE 200*  BUN 38*  CREATININE 0.77  CALCIUM 7.5*   LFT Recent Labs    07/05/22 0728  PROT 4.7*  ALBUMIN 2.4*  AST 22  ALT 14  ALKPHOS 57  BILITOT 0.4   PT/INR No results for input(s): "LABPROT", "INR" in the last 72 hours.  STUDIES: CT Head Wo Contrast  Result Date: 07/05/2022 CLINICAL DATA:  Head trauma.  Syncope x2.  Bruise to left forehead. EXAM: CT HEAD WITHOUT CONTRAST TECHNIQUE: Contiguous axial images were obtained from the base of the skull through the vertex without intravenous contrast. RADIATION DOSE REDUCTION: This exam was performed according to the departmental dose-optimization program which includes automated exposure control, adjustment of the mA and/or kV according to patient size and/or use of iterative reconstruction technique. COMPARISON:  None Available. FINDINGS: Brain: No acute hemorrhage, mass effect or midline shift. Gray-white differentiation is preserved. No hydrocephalus. No extra-axial collection. Basilar cisterns are patent. Vascular: No hyperdense vessel or unexpected calcification. Skull: No calvarial fracture or suspicious bone lesion. Skull base is unremarkable. Sinuses/Orbits: Unremarkable. Other: None. IMPRESSION: No evidence of acute intracranial injury. Electronically Signed   By: Emmit Alexanders M.D.   On: 07/05/2022 09:38      Impression / Plan:   Bobby Moreno is a 77 y.o. y/o male with recent diagnosis of  gastroesophageal adenocarcinoma receiving radiation therapy follows at [facility].  Present to the emergency room after feeling lightheaded passing out and passing dark stools last night.  Drop in over 4 g of hemoglobin from his baseline of 10 g to 6.5 g concerning for an upper GI bleed as there is an increase in the BUN/creatinine ratio as well.  Plan 1.  Monitor CBC and transfuse as needed 2.  IV PPI 3.  N.p.o. 4.  Once he is stable we can perform EGD tomorrow and apply Hemospray on the bleeding mass following which she may require more definitive radiotherapy or cryotherapy which can be done at [facility] 5.  History of acute hemorrhagic bleed and hemodynamically unstable will require a CT angiogram.  I have discussed this with Dr. Charna Archer  I have discussed alternative options, risks & benefits,  which include, but are not limited to, bleeding, infection, perforation,respiratory complication & drug reaction.  The patient agrees with this plan &  written consent will be obtained.    Thank you for involving me in the care of this patient.      LOS: 0 days   [name], MD  07/05/2022, 2:14 PM

## 2022-07-05 NOTE — ED Triage Notes (Signed)
Pt arrives from home via EMS for syncope x2. Upon EMS arrival pt's BP 74/43. Pt received 2L NS en route. Pt arrives alert and conversive. Pt has bruise to L forehead. No hx of blood thinner.

## 2022-07-05 NOTE — Telephone Encounter (Signed)
Received notification from [facility] clinical trial team the he is not to start radiation at this time. Once he starts clinical trial the sponsor may then allow palliative radiation. Dr. Baruch Gouty will be notified. Noted that Bobby Moreno is currently in the ED so I am not able to speak with him at present.   Dr Baron Sane, his oncologist, recommended to Bobby Moreno to cancel his appointment  and put radiation off until he goes on trial.  It will take at least 7-10 days to get the results back from the sponsor from his tissue samples so he will not start his first treatment probably until 07/15/22.  He definitely can not start the radiation in the next few days. Lots of times the sponsors will allow palliative radiation once a pt is on trial.

## 2022-07-05 NOTE — H&P (Signed)
History and Physical    Bobby Moreno [id number] DOB: July 04, 1945 DOA: 07/05/2022  Referring MD/NP/PA:   PCP: Zeb Comfort, MD   Patient coming from:  The patient is coming from home.    Chief Complaint: dark stool and syncope  HPI: Bobby Moreno is a 77 y.o. male with medical history significant of stage IV gastroesophageal adenocarcinoma (currently being evaluated for radiation and possible immunotherapy), colon cancer in 2022 with resection of the terminal ileum appendix and cecum, HTN, HLD, dCHF, anemia, GIB, who presents with dark stool, lightheadedness and syncope.  Pt states that he was feeling normal last night. He woke up in the early morning due to feeling like he needed to have a bowel movement. He stood up to go to the bathroom, then felt very dizzy and lightheaded, and passed out at about 1:00 AM. He states that he passed out again at about 4 AM. He injured his left forehead.  He states that his stool is dark in color.  Denies nausea, vomiting, abdominal pain.  No fever or chills.  Denies chest pain, cough, shortness breath.  No symptoms of UTI.  Patient does not have unilateral numbness or tinglings.  No facial droop or slurred speech.  Per report, patient was found to have hypotension with blood pressure 74/43, and was given 2 L IV fluid bolus by EMS.  When I saw patient in ED, his blood pressure is 110/73.  Data reviewed independently and ED Course: pt was found to have hemoglobin 6.5 (10.3 on 05/24/2022), WBC 14.7, GFR> 60, temperature normal, heart rate 134, 101, RR 24, 19, oxygen saturation 100% on 2 L oxygen.  CT of head is negative.  Patient is admitted to stepdown as inpatient.   EKG:  Not done in ED, will get one.      Review of Systems:   General: no fevers, chills, no body weight gain, fatigue HEENT: no blurry vision, hearing changes or sore throat Respiratory: no dyspnea, coughing, wheezing CV: no chest pain, no palpitations GI: no nausea, vomiting,  abdominal pain, diarrhea, constipation. Has dark stool GU: no dysuria, burning on urination, increased urinary frequency, hematuria  Ext: no leg edema Neuro: no unilateral weakness, numbness, or tingling, no vision change or hearing loss. Has syncope Skin: no rash. Has skin abrasion in left forehead MSK: No muscle spasm, no deformity, no limitation of range of movement in spin Heme: No easy bruising.  Travel history: No recent long distant travel.   Allergy: No Known Allergies  Past Medical History:  Diagnosis Date   HLD (hyperlipidemia)    HTN (hypertension)    PSVT (paroxysmal supraventricular tachycardia)     Past Surgical History:  Procedure Laterality Date   CATARACT EXTRACTION, BILATERAL  2010   ESOPHAGOGASTRODUODENOSCOPY N/A 05/18/2022   Procedure: ESOPHAGOGASTRODUODENOSCOPY (EGD);  Surgeon: Lin Landsman, MD;  Location: [facility] ENDOSCOPY;  Service: Gastroenterology;  Laterality: N/A;   ESOPHAGOGASTRODUODENOSCOPY (EGD) WITH PROPOFOL N/A 05/20/2022   Procedure: ESOPHAGOGASTRODUODENOSCOPY (EGD) WITH PROPOFOL;  Surgeon: [name], MD;  Location: [facility] ENDOSCOPY;  Service: Gastroenterology;  Laterality: N/A;    Social History:  reports that he has quit smoking. He has never used smokeless tobacco. He reports current alcohol use of about 7.0 standard drinks of alcohol per week. He reports that he does not use drugs.  Family History:  Family History  Problem Relation Age of Onset   Hypertension Mother    Dementia Mother    Prostate cancer Father    Emphysema Father  Prior to Admission medications   Medication Sig Start Date End Date Taking? Authorizing Provider  metoprolol succinate (TOPROL-XL) 50 MG 24 hr tablet Take 50 mg by mouth daily. 04/27/22 04/27/23  [provider]  Multiple Vitamin (MULTIVITAMIN WITH MINERALS) TABS tablet Take 1 tablet by mouth daily. 05/24/22   Mercy Riding, MD  nitroGLYCERIN (NITROSTAT) 0.4 MG SL tablet Place 0.4 mg under the  tongue every 5 (five) minutes as needed for chest pain (May take up to 3 doses.).    [provider]  pantoprazole (PROTONIX) 40 MG tablet Take 1 tablet (40 mg total) by mouth 2 (two) times daily. 05/24/22 08/22/22  Mercy Riding, MD  pravastatin (PRAVACHOL) 40 MG tablet Take 40 mg by mouth at bedtime. 04/27/22   [provider]    Physical Exam: Vitals:   07/05/22 1100 07/05/22 1131 07/05/22 1221 07/05/22 1300  BP: 118/72 121/79 116/68 137/68  Pulse: (!) 104 100 93 (!) 103  Resp: (!) '23 16 16   '$ Temp:  97.7 F (36.5 C) 98 F (36.7 C)   TempSrc:  Axillary Axillary   SpO2: 99% 99% 100% 98%  Weight:   73.7 kg   Height:   '5\' 9"'$  (1.753 m)    General: Not in acute distress. Pale looking HEENT:       Eyes: PERRL, EOMI, no scleral icterus.       ENT: No discharge from the ears and nose, no pharynx injection, no tonsillar enlargement.        Neck: No JVD, no bruit, no mass felt. Heme: No neck lymph node enlargement. Cardiac: S1/S2, RRR, No murmurs, No gallops or rubs. Respiratory: No rales, wheezing, rhonchi or rubs. GI: Soft, nondistended, nontender, no rebound pain, no organomegaly, BS present. GU: No hematuria Ext: No pitting leg edema bilaterally. 1+DP/PT pulse bilaterally. Musculoskeletal: No joint deformities, No joint redness or warmth, no limitation of ROM in spin. Skin: No rashes. Has skin abrasion in left forehead Neuro: Alert, oriented X3, cranial nerves II-XII grossly intact, moves all extremities normally.  Psych: Patient is not psychotic, no suicidal or hemocidal ideation.  Labs on Admission: I have personally reviewed following labs and imaging studies  CBC: Recent Labs  Lab 07/05/22 0728  WBC 14.7*  NEUTROABS 12.2*  HGB 6.5*  HCT 21.1*  MCV 95.0  PLT [id number]   Basic Metabolic Panel: Recent Labs  Lab 07/05/22 0728  NA 137  K 4.6  CL 109  CO2 19*  GLUCOSE 200*  BUN 38*  CREATININE 0.77  CALCIUM 7.5*   GFR: Estimated Creatinine Clearance:  81 mL/min (by C-G formula based on SCr of 0.77 mg/dL). Liver Function Tests: Recent Labs  Lab 07/05/22 0728  AST 22  ALT 14  ALKPHOS 57  BILITOT 0.4  PROT 4.7*  ALBUMIN 2.4*   Recent Labs  Lab 07/05/22 0728  LIPASE 37   No results for input(s): "AMMONIA" in the last 168 hours. Coagulation Profile: No results for input(s): "INR", "PROTIME" in the last 168 hours. Cardiac Enzymes: No results for input(s): "CKTOTAL", "CKMB", "CKMBINDEX", "TROPONINI" in the last 168 hours. BNP (last 3 results) No results for input(s): "PROBNP" in the last 8760 hours. HbA1C: No results for input(s): "HGBA1C" in the last 72 hours. CBG: Recent Labs  Lab 07/05/22 1156  GLUCAP 154*   Lipid Profile: No results for input(s): "CHOL", "HDL", "LDLCALC", "TRIG", "CHOLHDL", "LDLDIRECT" in the last 72 hours. Thyroid Function Tests: No results for input(s): "TSH", "T4TOTAL", "FREET4", "T3FREE", "THYROIDAB" in  the last 72 hours. Anemia Panel: No results for input(s): "VITAMINB12", "FOLATE", "FERRITIN", "TIBC", "IRON", "RETICCTPCT" in the last 72 hours. Urine analysis:    Component Value Date/Time   COLORURINE YELLOW (A) 05/18/2022 2135   APPEARANCEUR HAZY (A) 05/18/2022 2135   LABSPEC >1.046 (H) 05/18/2022 2135   PHURINE 5.0 05/18/2022 2135   GLUCOSEU NEGATIVE 05/18/2022 2135   HGBUR LARGE (A) 05/18/2022 2135   BILIRUBINUR NEGATIVE 05/18/2022 2135   [place] NEGATIVE 05/18/2022 2135   PROTEINUR 100 (A) 05/18/2022 2135   NITRITE NEGATIVE 05/18/2022 2135   LEUKOCYTESUR NEGATIVE 05/18/2022 2135   Sepsis Labs: '@LABRCNTIP'$ (procalcitonin:4,lacticidven:4) )No results found for this or any previous visit (from the past 240 hour(s)).   Radiological Exams on Admission: CT Head Wo Contrast  Result Date: 07/05/2022 CLINICAL DATA:  Head trauma.  Syncope x2.  Bruise to left forehead. EXAM: CT HEAD WITHOUT CONTRAST TECHNIQUE: Contiguous axial images were obtained from the base of the skull through the vertex  without intravenous contrast. RADIATION DOSE REDUCTION: This exam was performed according to the departmental dose-optimization program which includes automated exposure control, adjustment of the mA and/or kV according to patient size and/or use of iterative reconstruction technique. COMPARISON:  None Available. FINDINGS: Brain: No acute hemorrhage, mass effect or midline shift. Gray-white differentiation is preserved. No hydrocephalus. No extra-axial collection. Basilar cisterns are patent. Vascular: No hyperdense vessel or unexpected calcification. Skull: No calvarial fracture or suspicious bone lesion. Skull base is unremarkable. Sinuses/Orbits: Unremarkable. Other: None. IMPRESSION: No evidence of acute intracranial injury. Electronically Signed   By: Emmit Alexanders M.D.   On: 07/05/2022 09:38      Assessment/Plan Principal Problem:   Upper GI bleeding Active Problems:   Acute blood loss anemia   Iron deficiency anemia   Esophageal adenocarcinoma (HCC)   HLD (hyperlipidemia)   HTN (hypertension)   Leukocytosis   Elevated lactic acid level   Chronic diastolic CHF (congestive heart failure) (HCC)   Assessment and Plan:  Upper GI bleeding and acute blood loss anemia: Hgb 10.3 --> 6.5.  Most likely due to esophageal cancer with bleeding.  Patient is at high risk of deteriorating.  Initially hypotensive, which responded to IV fluid resuscitation.  Currently hemodynamically stable consulted Dr. Vicente Males of IG  - will admitted to SDU as inpatient - transfuse 2 units of blood now - NPO for possible EGD - IVF: 2L IVF bolus, then at 75 mL/hr - Start IV pantoprazole 40 mg bid - Zofran IV for nausea - Avoid NSAIDs and SQ heparin - Maintain IV access (2 large bore IVs if possible). - Monitor closely and follow q6h cbc, transfuse as necessary, if Hgb<7.0 - LaB: INR, PTT and type screen  Iron deficiency anemia: recent anemia panel showed iron 25, ferritin 23 -Given 200 mg of IV Venofer -Start  ferrous sulfate 325 mg twice daily tomorrow  Esophageal adenocarcinoma ([place]): Stage IV.  Patient is currently being evaluated for radiation therapy and possible immunotherapy. -Follow-up with radiation oncology and oncology in [facility]  HLD (hyperlipidemia) -Pravastatin  HTN (hypertension) -Hold blood pressure medications due to hypotension  Leukocytosis: WBC 14.7, no source of infection identified.  No fever.  Likely reactive -Follow-up with CBC  Elevated lactic acid level: Lactic acid 3.2, 2.5, likely due to hypovolemic and hypoperfusion. -IV fluid as above -Trend lactic acid level  Chronic diastolic CHF (congestive heart failure) ([place]): 2D echo 05/18/2022 showed EF of 65-70% with grade 1 diastolic dysfunction.  Patient does not have leg edema JVD but CHF is  compensated. -Check BNP     DVT ppx: SCD  Code Status: DNR per pt and his wife  Family Communication:  Yes, patient's wife by phone  Disposition Plan:  Anticipate discharge back to previous environment  Consults called:  Dr. Vicente Males of GI  Admission status and Level of care: Stepdown:    as inpt       Dispo: The patient is from: Home              Anticipated d/c is to: Home              Anticipated d/c date is: 2 days              Patient currently is not medically stable to d/c.    Severity of Illness:  The appropriate patient status for this patient is INPATIENT. Inpatient status is judged to be reasonable and necessary in order to provide the required intensity of service to ensure the patient's safety. The patient's presenting symptoms, physical exam findings, and initial radiographic and laboratory data in the context of their chronic comorbidities is felt to place them at high risk for further clinical deterioration. Furthermore, it is not anticipated that the patient will be medically stable for discharge from the hospital within 2 midnights of admission.   * I certify that at the point of admission it is my  clinical judgment that the patient will require inpatient hospital care spanning beyond 2 midnights from the point of admission due to high intensity of service, high risk for further deterioration and high frequency of surveillance required.*       Date of Service 07/05/2022    Ivor Costa [facility]   If 7PM-7AM, please contact night-coverage [email or web address] 07/05/2022, 1:06 PM

## 2022-07-06 ENCOUNTER — Encounter: Payer: Self-pay | Admitting: Internal Medicine

## 2022-07-06 ENCOUNTER — Inpatient Hospital Stay: Payer: Medicare HMO | Admitting: General Practice

## 2022-07-06 ENCOUNTER — Ambulatory Visit: Payer: Medicare HMO

## 2022-07-06 ENCOUNTER — Encounter
Admission: EM | Disposition: A | Discharge status: Home / Self Care | Payer: Self-pay | Source: Home / Self Care | Attending: Hospitalist

## 2022-07-06 DIAGNOSIS — K922 Gastrointestinal hemorrhage, unspecified: Secondary | ICD-10-CM | POA: Diagnosis not present

## 2022-07-06 HISTORY — PX: ESOPHAGOGASTRODUODENOSCOPY (EGD) WITH PROPOFOL: SHX5813

## 2022-07-06 LAB — HEMOGLOBIN AND HEMATOCRIT, BLOOD
HCT: 22.9 % — ABNORMAL LOW (ref 39.0–52.0)
HCT: 22.6 % — ABNORMAL LOW (ref 39.0–52.0)
Hemoglobin: 7.4 g/dL — ABNORMAL LOW (ref 13.0–17.0)
Hemoglobin: 7.3 g/dL — ABNORMAL LOW (ref 13.0–17.0)

## 2022-07-06 LAB — BASIC METABOLIC PANEL
Anion gap: 5 (ref 5–15)
BUN: 39 mg/dL — ABNORMAL HIGH (ref 8–23)
CO2: 23 mmol/L (ref 22–32)
Calcium: 7.8 mg/dL — ABNORMAL LOW (ref 8.9–10.3)
Chloride: 112 mmol/L — ABNORMAL HIGH (ref 98–111)
Creatinine, Ser: 0.59 mg/dL — ABNORMAL LOW (ref 0.61–1.24)
GFR, Estimated: >60 mL/min (ref >60)
Glucose, Bld: 124 mg/dL — ABNORMAL HIGH (ref 70–99)
Potassium: 4 mmol/L (ref 3.5–5.1)
Sodium: 140 mmol/L (ref 135–145)

## 2022-07-06 LAB — CBC
HCT: 24.8 % — ABNORMAL LOW (ref 39.0–52.0)
Hemoglobin: 8.1 g/dL — ABNORMAL LOW (ref 13.0–17.0)
MCH: 28.8 pg (ref 26.0–34.0)
MCHC: 32.7 g/dL (ref 30.0–36.0)
MCV: 88.3 fL (ref 80.0–100.0)
Platelets: 224 10*3/uL (ref 150–400)
RBC: 2.81 MIL/uL — ABNORMAL LOW (ref 4.22–5.81)
RDW: 15.7 % — ABNORMAL HIGH (ref 11.5–15.5)
WBC: 10.4 10*3/uL (ref 4.0–10.5)
nRBC: 0 % (ref 0.0–0.2)

## 2022-07-06 LAB — PREPARE RBC (CROSSMATCH)

## 2022-07-06 SURGERY — ESOPHAGOGASTRODUODENOSCOPY (EGD) WITH PROPOFOL
Anesthesia: General

## 2022-07-06 MED ORDER — SODIUM CHLORIDE 0.9 % IV SOLN
200.0000 mg | Freq: Every day | INTRAVENOUS | Status: DC
  Administered 2022-07-06: 200 mg via INTRAVENOUS
  Filled 2022-07-06: qty 200
  Filled 2022-07-06: qty 10

## 2022-07-06 MED ORDER — SODIUM CHLORIDE 0.9% IV SOLUTION: Freq: Once | INTRAVENOUS | Status: AC

## 2022-07-06 MED ORDER — DEXMEDETOMIDINE HCL IN NACL 80 MCG/20ML IV SOLN
INTRAVENOUS | Status: DC | PRN
  Administered 2022-07-06: 4 ug via INTRAVENOUS
  Administered 2022-07-06: 8 ug via INTRAVENOUS

## 2022-07-06 MED ORDER — SODIUM CHLORIDE 0.9 % IV SOLN
INTRAVENOUS | Status: DC
  Administered 2022-07-06: 1000 mL via INTRAVENOUS

## 2022-07-06 MED ORDER — PROPOFOL 10 MG/ML IV BOLUS
INTRAVENOUS | Status: DC | PRN
  Administered 2022-07-06 (×2): 20 mg via INTRAVENOUS
  Administered 2022-07-06: 60 mg via INTRAVENOUS
  Administered 2022-07-06: 30 mg via INTRAVENOUS
  Administered 2022-07-06 (×2): 10 mg via INTRAVENOUS

## 2022-07-06 MED ORDER — LIDOCAINE HCL (CARDIAC) PF 100 MG/5ML IV SOSY
PREFILLED_SYRINGE | INTRAVENOUS | Status: DC | PRN
  Administered 2022-07-06: 80 mg via INTRAVENOUS

## 2022-07-06 NOTE — Anesthesia Preprocedure Evaluation (Signed)
Anesthesia Evaluation  Patient identified by MRN, date of birth, ID band Patient awake    Reviewed: Allergy & Precautions, NPO status , Patient's Chart, lab work & pertinent test results  History of Anesthesia Complications Negative for: history of anesthetic complications  Airway Mallampati: II  TM Distance: >3 FB Neck ROM: full    Dental  (+) Poor Dentition   Pulmonary pneumonia (influenza, patient extubated 1/11 currently on 3L ), former smoker   Pulmonary exam normal        Cardiovascular Exercise Tolerance: Poor hypertension, On Medications + dysrhythmias Atrial Fibrillation and Supra Ventricular Tachycardia      Neuro/Psych negative neurological ROS  negative psych ROS   GI/Hepatic negative GI ROS, Neg liver ROS,,,Esophageal mass concerning for malignancy  Hx of colon cancer     Endo/Other  negative endocrine ROS    Renal/GU negative Renal ROS  negative genitourinary   Musculoskeletal   Abdominal   Peds  Hematology  (+) Blood dyscrasia, anemia   Anesthesia Other Findings Past Medical History: No date: HLD (hyperlipidemia) No date: HTN (hypertension) No date: PSVT (paroxysmal supraventricular tachycardia)  Past Surgical History: 2010: CATARACT EXTRACTION, BILATERAL 05/18/2022: ESOPHAGOGASTRODUODENOSCOPY; N/A     Comment:  Procedure: ESOPHAGOGASTRODUODENOSCOPY (EGD);  Surgeon:               Lin Landsman, MD;  Location: [facility] ENDOSCOPY;                Service: Gastroenterology;  Laterality: N/A;  BMI    Body Mass Index: 25.65 kg/m      Reproductive/Obstetrics negative OB ROS                              Anesthesia Physical Anesthesia Plan  ASA: 3  Anesthesia Plan: General   Post-op Pain Management: Minimal or no pain anticipated   Induction: Intravenous  PONV Risk Score and Plan: Propofol infusion and TIVA  Airway Management Planned: Natural Airway and  Nasal Cannula  Additional Equipment:   Intra-op Plan:   Post-operative Plan:   Informed Consent: I have reviewed the patients History and Physical, chart, labs and discussed the procedure including the risks, benefits and alternatives for the proposed anesthesia with the patient or authorized representative who has indicated his/her understanding and acceptance.     Dental Advisory Given  Plan Discussed with: Anesthesiologist, CRNA and Surgeon  Anesthesia Plan Comments: (Patient consented for risks of anesthesia including but not limited to:  - adverse reactions to medications - risk of airway placement if required - damage to eyes, teeth, lips or other oral mucosa - nerve damage due to positioning  - sore throat or hoarseness - Damage to heart, brain, nerves, lungs, other parts of body or loss of life  Patient voiced understanding.)         Anesthesia Quick Evaluation

## 2022-07-06 NOTE — H&P (Signed)
Bobby Bellows, MD [address] [place], [place], [place], [address] [address], [place], [place], [place], [address] Phone: [phone]  Fax: [phone]  Primary Care Physician:  Zeb Comfort, MD   Pre-Procedure History & Physical: HPI:  Bobby Moreno is a 77 y.o. male is here for an endoscopy    Past Medical History:  Diagnosis Date   HLD (hyperlipidemia)    HTN (hypertension)    PSVT (paroxysmal supraventricular tachycardia)     Past Surgical History:  Procedure Laterality Date   CATARACT EXTRACTION, BILATERAL  2010   ESOPHAGOGASTRODUODENOSCOPY N/A 05/18/2022   Procedure: ESOPHAGOGASTRODUODENOSCOPY (EGD);  Surgeon: Lin Landsman, MD;  Location: [facility] ENDOSCOPY;  Service: Gastroenterology;  Laterality: N/A;   ESOPHAGOGASTRODUODENOSCOPY (EGD) WITH PROPOFOL N/A 05/20/2022   Procedure: ESOPHAGOGASTRODUODENOSCOPY (EGD) WITH PROPOFOL;  Surgeon: Bobby Bellows, MD;  Location: [facility] ENDOSCOPY;  Service: Gastroenterology;  Laterality: N/A;    Prior to Admission medications   Medication Sig Start Date End Date Taking? Authorizing Provider  metoprolol succinate (TOPROL-XL) 50 MG 24 hr tablet Take 50 mg by mouth daily. 04/27/22 04/27/23 Yes [provider]  Multiple Vitamin (MULTIVITAMIN WITH MINERALS) TABS tablet Take 1 tablet by mouth daily. 05/24/22  Yes Mercy Riding, MD  pantoprazole (PROTONIX) 40 MG tablet Take 1 tablet (40 mg total) by mouth 2 (two) times daily. 05/24/22 08/22/22 Yes Mercy Riding, MD  perindopril (ACEON) 2 MG tablet Take 2 mg by mouth daily.   Yes [provider]  pravastatin (PRAVACHOL) 40 MG tablet Take 40 mg by mouth at bedtime. 04/27/22  Yes [provider]  nitroGLYCERIN (NITROSTAT) 0.4 MG SL tablet Place 0.4 mg under the tongue every 5 (five) minutes as needed for chest pain (May take up to 3 doses.).    [provider]    Allergies as of 07/05/2022   (No Known Allergies)    Family History  Problem  Relation Age of Onset   Hypertension Mother    Dementia Mother    Prostate cancer Father    Emphysema Father     Social History   Socioeconomic History   Marital status: Married    Spouse name: Not on file   Number of children: Not on file   Years of education: Not on file   Highest education level: Not on file  Occupational History   Not on file  Tobacco Use   Smoking status: Former   Smokeless tobacco: Never  Substance and Sexual Activity   Alcohol use: Yes    Alcohol/week: 7.0 standard drinks of alcohol    Types: 7 Cans of beer per week   Drug use: Never   Sexual activity: Not Currently    Partners: Female  Other Topics Concern   Not on file  Social History Narrative   Not on file   Social Determinants of Health   Financial Resource Strain: Not on file  Food Insecurity: No Food Insecurity (07/05/2022)   Hunger Vital Sign    Worried About Running Out of Food in the Last Year: Never true    Ran Out of Food in the Last Year: Never true  Transportation Needs: No Transportation Needs (07/05/2022)   PRAPARE - Hydrologist (Medical): No    Lack of Transportation (Non-Medical): No  Physical Activity: Not on file  Stress: Not on file  Social Connections: Not on file  Intimate Partner Violence: Not At Risk (07/05/2022)   Humiliation, Afraid, Rape, and  Kick questionnaire    Fear of Current or Ex-Partner: No    Emotionally Abused: No    Physically Abused: No    Sexually Abused: No    Review of Systems: See HPI, otherwise negative ROS  Physical Exam: BP (!) 143/71   Pulse (!) 103   Temp 98 F (36.7 C) (Temporal)   Resp (!) 22   Ht '5\' 8"'$  (1.727 m)   Wt 74.4 kg   SpO2 100%   BMI 24.94 kg/m  General:   Alert,  pleasant and cooperative in NAD Head:  Normocephalic and atraumatic. Neck:  Supple; no masses or thyromegaly. Lungs:  Clear throughout to auscultation, normal respiratory effort.    Heart:  +S1, +S2, Regular rate and rhythm, No  edema. Abdomen:  Soft, nontender and nondistended. Normal bowel sounds, without guarding, and without rebound.   Neurologic:  Alert and  oriented x4;  grossly normal neurologically.  Impression/Plan: Bobby Moreno is here for an endoscopy  to be performed for  evaluation of gi bleed     Risks, benefits, limitations, and alternatives regarding endoscopy have been reviewed with the patient.  Questions have been answered.  All parties agreeable.   Bobby Bellows, MD  07/06/2022, 1:17 PM

## 2022-07-06 NOTE — Anesthesia Postprocedure Evaluation (Signed)
Anesthesia Post Note  Patient: Bobby Moreno  Procedure(s) Performed: ESOPHAGOGASTRODUODENOSCOPY (EGD) WITH PROPOFOL  Patient location during evaluation: Endoscopy Anesthesia Type: General Level of consciousness: awake and alert Pain management: pain level controlled Vital Signs Assessment: post-procedure vital signs reviewed and stable Respiratory status: spontaneous breathing, nonlabored ventilation, respiratory function stable and patient connected to nasal cannula oxygen Cardiovascular status: blood pressure returned to baseline and stable Postop Assessment: no apparent nausea or vomiting Anesthetic complications: no  No notable events documented.   Last Vitals:  Vitals:   07/06/22 1254 07/06/22 1345  BP: (!) 143/71 113/74  Pulse: (!) 103   Resp: (!) 22   Temp: 36.7 C 36.8 C  SpO2: 100%     Last Pain:  Vitals:   07/06/22 1345  TempSrc: Temporal  PainSc: 0-No pain                 Dimas Millin

## 2022-07-06 NOTE — Op Note (Signed)
[facility] Gastroenterology Patient Name: Bobby Moreno Procedure Date: 07/06/2022 1:24 PM MRN: [id number] Account #: [id number] Date of Birth: Mar 16, 1946 Admit Type: Inpatient Age: 77 Room: [facility] ENDO ROOM 3 Gender: Male Note Status: Finalized Instrument Name: Bleederscope [id number] Procedure:             Upper GI endoscopy Indications:           [place] Providers:             [name] MD, MD Referring MD:          Zeb Comfort (Referring MD) Medicines:             Monitored Anesthesia Care Complications:         No immediate complications. Procedure:             Pre-Anesthesia Assessment:                        - Prior to the procedure, a History and Physical was                         performed, and patient medications, allergies and                         sensitivities were reviewed. The patient's tolerance                         of previous anesthesia was reviewed.                        - The risks and benefits of the procedure and the                         sedation options and risks were discussed with the                         patient. All questions were answered and informed                         consent was obtained.                        - ASA Grade Assessment: II - A patient with mild                         systemic disease.                        After obtaining informed consent, the endoscope was                         passed under direct vision. Throughout the procedure,                         the patient's blood pressure, pulse, and oxygen                         saturations were monitored continuously. The upper GI                         endoscopy was accomplished  with ease. The patient                         tolerated the procedure well. The Endoscope was                         introduced through the mouth, and advanced to the                         third part of duodenum. Findings:      The examined duodenum was normal.       The stomach was normal.      The cardia and gastric fundus were normal on retroflexion.      A large, ulcerating mass with no bleeding and stigmata of recent       bleeding was found at the gastroesophageal junction. The mass was       partially obstructing and partially circumferential (involving two       thirds of the lumen circumference). To treat the bleeding lesion,       hemostatic spray was deployed. Multiple sprays were applied. There was       no bleeding at the end of the procedure.      The exam was otherwise without abnormality. Impression:            - Normal examined duodenum.                        - Normal stomach.                        - Partially obstructing esophageal tumor was found at                         the gastroesophageal junction. Hemostatic spray                         applied.                        - The examination was otherwise normal.                        - No specimens collected. Recommendation:        - Return patient to hospital ward for ongoing care.                        - Clear liquid diet today.                        - Continue present medications. Procedure Code(s):     --- Professional ---                        [id number], Esophagogastroduodenoscopy, flexible,                         transoral; with control of bleeding, any method Diagnosis Code(s):     --- Professional ---                        K92.1, Melena (includes Hematochezia)  D49.0, Neoplasm of unspecified behavior of digestive                         system CPT copyright 2022 [employer]. All rights reserved. The codes documented in this report are preliminary and upon coder review may  be revised to meet current compliance requirements. [name], MD [name] MD, MD 07/06/2022 1:41:26 PM This report has been signed electronically. Number of Addenda: 0 Note Initiated On: 07/06/2022 1:24 PM Estimated Blood Loss:  Estimated blood loss:  none.      [facility]

## 2022-07-06 NOTE — Progress Notes (Signed)
  PROGRESS NOTE    Bobby Moreno  [id number] DOB: 1946/04/16 DOA: 07/05/2022 PCP: Bobby Comfort, MD  [id number]/[id number]-AA  LOS: 1 day   Brief hospital course:   Assessment & Plan: Bobby Moreno is a 77 y.o. male with medical history significant of stage IV gastroesophageal adenocarcinoma (currently being evaluated for radiation and possible immunotherapy), colon cancer in 2022 with resection of the terminal ileum appendix and cecum, HTN, HLD, dCHF, anemia, GIB, who presents with dark stool, lightheadedness and syncope.  pt was found to have hemoglobin 6.5 (10.3 on 05/24/2022)    Upper GI bleeding 2/2 Esophageal cancer mass  --Hgb 10.3 --> 6.5.  Most likely due to esophageal cancer with bleeding.  --EGD today, partially obstructing esophageal tumor was found at the gastroesophageal junction. Hemostatic spray applied. --cont IV protonix BID --clear liquid and then soft diet as tolerated --proceed to scheduled oncology appointment tomorrow from hospital   Iron deficiency anemia Acute blood loss anemia --s/p 2u pRBC and IV iron x1 Plan: --3rd unit of pRBC before discharge --IV iron x2 --discharge on oral iron supplement   Esophageal adenocarcinoma (Bobby Moreno): Stage IV.  Patient is currently being evaluated for radiation therapy and possible immunotherapy. -Follow-up with radiation oncology and oncology in [facility]   HLD (hyperlipidemia) -Pravastatin   HTN (hypertension) -Hold blood pressure medications due to hypotension   Leukocytosis:  WBC 14.7, no source of infection identified.  No fever.  Likely reactive -Follow-up with CBC   Elevated lactic acid level:  Lactic acid 3.2, 2.5, likely due to hypovolemic and hypoperfusion. --trend   Chronic diastolic CHF (congestive heart failure) ([place]): 2D echo 05/18/2022 showed EF of 65-70% with grade 1 diastolic dysfunction.  Patient does not have leg edema JVD.  CHF is compensated.   DVT prophylaxis: SCD/Compression stockings Code Status: DNR   Family Communication: wife updated at bedside today Level of care: Med-Surg Dispo:   The patient is from: home Anticipated d/c is to: home Anticipated d/c date is: tomorrow   Subjective and Interval History:  EGD today, partially obstructing esophageal tumor was found at the gastroesophageal junction. Hemostatic spray applied.   Objective: Vitals:   07/06/22 1450 07/06/22 1500 07/06/22 1600 07/06/22 1700  BP:  137/76 139/68 136/78  Pulse:  97 86 97  Resp:  (!) 23 17 (!) 23  Temp: 98.6 F (37 C)     TempSrc: Axillary     SpO2:  98% 99% 99%  Weight:      Height:        Intake/Output Summary (Last 24 hours) at 07/06/2022 1802 Last data filed at 07/06/2022 1700 Gross per 24 hour  Intake 1757.52 ml  Output 1600 ml  Net 157.52 ml   Filed Weights   07/05/22 1221 07/06/22 0500 07/06/22 1254  Weight: 73.7 kg 72 kg 74.4 kg    Examination:   Constitutional: NAD, AAOx3 HEENT: conjunctivae and lids normal, EOMI CV: No cyanosis.   RESP: normal respiratory effort, on RA Neuro: II - XII grossly intact.   Psych: Normal mood and affect.  Appropriate judgement and reason   Data Reviewed: I have personally reviewed labs and imaging studies  Time spent: 50 minutes  Bobby Bi, MD [facility] If 7PM-7AM, please contact night-coverage 07/06/2022, 6:02 PM

## 2022-07-06 NOTE — Progress Notes (Signed)
Report called to RN on 2C, wife at bedside updated on the transfer and plan for tonight, new room number given. Pt stable, no distress noted.

## 2022-07-06 NOTE — Transfer of Care (Signed)
Immediate Anesthesia Transfer of Care Note  Patient: Bobby Moreno  Procedure(s) Performed: ESOPHAGOGASTRODUODENOSCOPY (EGD) WITH PROPOFOL  Patient Location: PACU  Anesthesia Type:General  Level of Consciousness: awake  Airway & Oxygen Therapy: Patient Spontanous Breathing  Post-op Assessment: Report given to RN and Post -op Vital signs reviewed and stable  Post vital signs: Reviewed and stable  Last Vitals:  Vitals Value Taken Time  BP 110/71 1345  Temp 35.9 1345  Pulse 74 1345  Resp 18 1345  SpO2 98 1345    Last Pain:  Vitals:   07/06/22 1254  TempSrc: Temporal  PainSc: 0-No pain      Patients Stated Pain Goal: 0 ([id number])  Complications: No notable events documented.

## 2022-07-07 ENCOUNTER — Encounter: Payer: Self-pay | Admitting: Gastroenterology

## 2022-07-07 ENCOUNTER — Ambulatory Visit
Admission: RE | Admit: 2022-07-07 | Discharge: 2022-07-07 | Disposition: A | Discharge status: Home / Self Care | Payer: Medicare HMO | Source: Ambulatory Visit | Attending: Radiation Oncology | Admitting: Radiation Oncology

## 2022-07-07 ENCOUNTER — Ambulatory Visit: Payer: Medicare HMO

## 2022-07-07 DIAGNOSIS — C155 Malignant neoplasm of lower third of esophagus: Secondary | ICD-10-CM | POA: Insufficient documentation

## 2022-07-07 LAB — TYPE AND SCREEN
ABO/RH(D): O POS
Antibody Screen: NEGATIVE
Unit division: 0
Unit division: 0
Unit division: 0

## 2022-07-07 LAB — BPAM RBC
Blood Product Expiration Date: 202403302359
Blood Product Expiration Date: 202403302359
Blood Product Expiration Date: 202404012359
ISSUE DATE / TIME: 202402270846
ISSUE DATE / TIME: 202402271024
ISSUE DATE / TIME: 202402281952
Unit Type and Rh: 5100
Unit Type and Rh: 5100
Unit Type and Rh: 5100

## 2022-07-07 LAB — HEMOGLOBIN AND HEMATOCRIT, BLOOD
HCT: 25.5 % — ABNORMAL LOW (ref 39.0–52.0)
Hemoglobin: 8.4 g/dL — ABNORMAL LOW (ref 13.0–17.0)

## 2022-07-07 LAB — LACTIC ACID, PLASMA: Lactic Acid, Venous: 0.8 mmol/L (ref 0.5–1.9)

## 2022-07-07 MED ORDER — FERROUS SULFATE 325 (65 FE) MG PO TABS: 325.0000 mg | ORAL_TABLET | Freq: Two times a day (BID) | ORAL | 3 refills | Status: DC

## 2022-07-07 MED ORDER — ZOLPIDEM TARTRATE 5 MG PO TABS: 5.0000 mg | ORAL_TABLET | Freq: Every evening | ORAL | 0 refills | Status: DC | PRN

## 2022-07-07 MED ORDER — METOPROLOL SUCCINATE ER 50 MG PO TB24: 50.0000 mg | ORAL_TABLET | Freq: Every day | ORAL | Status: DC

## 2022-07-07 NOTE — Discharge Summary (Signed)
Physician Discharge Summary   Bobby Moreno  male DOB: October 18, 1945  [id number]  PCP: Zeb Comfort, MD  Admit date: 07/05/2022 Discharge date: 07/07/2022  Admitted From: home Disposition:  home CODE STATUS: DNR   Hospital Course:  For full details, please see H&P, progress notes, consult notes and ancillary notes.  Briefly,  Bobby Moreno is a 77 y.o. male with medical history significant of stage IV gastroesophageal adenocarcinoma (currently being evaluated for radiation and possible immunotherapy), colon cancer in 2022 with resection of the terminal ileum appendix and cecum, HTN, dCHF, anemia, GIB, who presented with dark stool, lightheadedness and syncope.  pt was found to have hemoglobin 6.5 (10.3 on 05/24/2022)    Upper GI bleeding 2/2 Esophageal cancer mass  --Hgb 10.3 --> 6.5.  Most likely due to esophageal cancer with bleeding.  --EGD found partially obstructing esophageal tumor at the gastroesophageal junction. Hemostatic spray applied. --IV protonix BID transitioned to oral protonix BID at discharge. --proceed to scheduled oncology appointment today after discharge.   Iron deficiency anemia Acute blood loss anemia --s/p 3u pRBC and IV iron x2 --Hgb 8.4 prior to discharge. --discharged on oral iron supplement   Esophageal adenocarcinoma ([place]): Stage IV.  Patient is currently being evaluated for radiation therapy and possible immunotherapy. -Follow-up with radiation oncology and oncology in [facility]   HLD (hyperlipidemia) -Pravastatin   HTN (hypertension) --Home Toprol held during hospitalization due to low BP on presentation.  BP started to trend up prior to discharge, so home Toprol resumed.   Leukocytosis:  WBC 14.7, no source of infection identified.  No fever.  Likely reactive.  WBC normalzied the next day.  Elevated lactic acid level, resolved Lactic acid 3.2, 2.5, likely due to hypovolemic and hypoperfusion.   Chronic diastolic CHF (congestive heart  failure) ([place]): 2D echo 05/18/2022 showed EF of 65-70% with grade 1 diastolic dysfunction.  Patient does not have leg edema JVD.  CHF is compensated.    Discharge Diagnoses:  Principal Problem:   Upper GI bleed Active Problems:   Acute blood loss anemia   Iron deficiency anemia   Esophageal adenocarcinoma (H[facility])   HLD (hyperlipidemia)   HTN (hypertension)   Leukocytosis   Elevated lactic acid level   Chronic diastolic CHF (congestive heart failure) (HCC)   30 Day Unplanned Readmission Risk Score    Flowsheet Row ED to Hosp-Admission (Current) from 07/05/2022 in [facility]  30 Day Unplanned Readmission Risk Score (%) 17.86 Filed at 07/07/2022 0400       This score is the patient's risk of an unplanned readmission within 30 days of being discharged (0 -100%). The score is based on dignosis, age, lab data, medications, orders, and past utilization.   Low:  0-14.9   Medium: 15-21.9   High: 22-29.9   Extreme: 30 and above         Discharge Instructions:  Allergies as of 07/07/2022   No Known Allergies      Medication List     TAKE these medications    ferrous sulfate 325 (65 FE) MG tablet Take 1 tablet (325 mg total) by mouth 2 (two) times daily with a meal.   metoprolol succinate 50 MG 24 hr tablet Commonly known as: TOPROL-XL Take 50 mg by mouth daily.   multivitamin with minerals Tabs tablet Take 1 tablet by mouth daily.   nitroGLYCERIN 0.4 MG SL tablet Commonly known as: NITROSTAT Place 0.4 mg under the tongue every 5 (five) minutes as needed  for chest pain (May take up to 3 doses.).   pantoprazole 40 MG tablet Commonly known as: PROTONIX Take 1 tablet (40 mg total) by mouth 2 (two) times daily.   perindopril 2 MG tablet Commonly known as: ACEON Take 2 mg by mouth daily.   pravastatin 40 MG tablet Commonly known as: PRAVACHOL Take 40 mg by mouth at bedtime.   zolpidem 5 MG tablet Commonly known as: AMBIEN Take 1  tablet (5 mg total) by mouth at bedtime as needed for sleep.         Follow-up Information     Zeb Comfort, MD Follow up in 1 week(s).   Specialty: Family Medicine Contact information: [address] [place] [address] [phone]                 No Known Allergies   The results of significant diagnostics from this hospitalization (including imaging, microbiology, ancillary and laboratory) are listed below for reference.   Consultations:   Procedures/Studies: CT Head Wo Contrast  Result Date: 07/05/2022 CLINICAL DATA:  Head trauma.  Syncope x2.  Bruise to left forehead. EXAM: CT HEAD WITHOUT CONTRAST TECHNIQUE: Contiguous axial images were obtained from the base of the skull through the vertex without intravenous contrast. RADIATION DOSE REDUCTION: This exam was performed according to the departmental dose-optimization program which includes automated exposure control, adjustment of the mA and/or kV according to patient size and/or use of iterative reconstruction technique. COMPARISON:  None Available. FINDINGS: Brain: No acute hemorrhage, mass effect or midline shift. Gray-white differentiation is preserved. No hydrocephalus. No extra-axial collection. Basilar cisterns are patent. Vascular: No hyperdense vessel or unexpected calcification. Skull: No calvarial fracture or suspicious bone lesion. Skull base is unremarkable. Sinuses/Orbits: Unremarkable. Other: None. IMPRESSION: No evidence of acute intracranial injury. Electronically Signed   By: Emmit Alexanders M.D.   On: 07/05/2022 09:38   NM PET Image Initial (PI) Skull Base To Thigh  Result Date: 06/09/2022 CLINICAL DATA:  Initial treatment strategy for esophageal cancer. EXAM: NUCLEAR MEDICINE PET SKULL BASE TO THIGH TECHNIQUE: 9.2 mCi F-18 FDG was injected intravenously. Full-ring PET imaging was performed from the skull base to thigh after the radiotracer. CT data was obtained and used for attenuation  correction and anatomic localization. Fasting blood glucose: Ninety-five mg/dl COMPARISON:  CT angiogram 05/18/2022 FINDINGS: Mediastinal blood pool activity: SUV max 1.7 Liver activity: SUV max NA NECK: No significant abnormal hypermetabolic activity in this region. Incidental CT findings: Bilateral common carotid atherosclerotic vascular calcification. CHEST: Hypermetabolic mass at the gastroesophageal junction and extending into the stomach, maximum SUV 13.1, compatible with malignancy. The associated hypermetabolic activity extends over about 6.3 cm in long axis. Incidental CT findings: Coronary, aortic arch, and branch vessel atherosclerotic vascular disease. Mild cardiomegaly. Mildly dilated distal esophagus versus type 1 hiatal hernia. Scarring or atelectasis in both lower lobes. ABDOMEN/PELVIS: Three hypermetabolic foci compatible with metastatic lesions are noted in the liver. A segment 2 lesion has a maximum SUV 8.8 and a lesion likely in segment 4a of the liver has a maximum SUV of 9.6. A third lesion in the right hepatic lobe has maximum SUV of 5.5. These lesions are indistinct on the CT data, but the hypermetabolic activity associated with the segment 4a lesion measures about 1.9 cm in long axis. Faint clustered small right gastric lymph nodes are observed, only measuring up to about 0.9 cm in diameter on image 144 series 2, but with faintly increased SUV, maximum SUV 3.6, probably reflecting  local nodal malignancy. Additional retroperitoneal adenopathy is mildly hypermetabolic, for example clustered lymph nodes measuring about 1.3 cm in short axis on image 164 series 2 in the left periaortic region maximum SUV of 7.0. Incidental CT findings: Systemic atherosclerosis including the abdominal aorta. Bilateral nonobstructive renal calculi. Old left renal scarring. Benign cyst inferiorly in the right hepatic lobe. Right scrotal hydrocele. SKELETON: Small osseous metastatic lesions are identified in the  right proximal humeral metaphysis (SUV max 5.0) and within the left T3 vertebral body (SUV max 4.5). Subtle accentuated activity anteriorly at the T8-9 level appears to be along a spur and accordingly is most likely inflammatory. Incidental CT findings: Chronic appearing compression fracture at L2. IMPRESSION: 1. Hypermetabolic mass at the gastroesophageal junction and proximal stomach, maximum SUV 13.1, compatible with malignancy. 2. Small clustered right gastric lymph nodes with low-grade activity, probably reflecting local nodal malignancy. There is also hypermetabolic periaortic adenopathy. 3. 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 4. Small hypermetabolic osseous metastatic lesions in the right proximal humerus and left T3 vertebral body, compatible with metastatic disease to the skeleton. 5. Other imaging findings of potential clinical significance: Nonobstructive bilateral nephrolithiasis. Coronary atherosclerosis. Mild cardiomegaly. Chronic compression fracture at L2. Aortic atherosclerosis. Right scrotal hydrocele. Aortic Atherosclerosis (ICD10-I70.0). Electronically Signed   By: Van Clines M.D.   On: 06/09/2022 14:33      Labs: BNP (last 3 results) Recent Labs    05/17/22 1847 05/18/22 2036 07/05/22 0728  BNP 60.7 100.2* [id number]   Basic Metabolic Panel: Recent Labs  Lab 07/05/22 0728 07/06/22 0627  NA 137 140  K 4.6 4.0  CL 109 112*  CO2 19* 23  GLUCOSE 200* 124*  BUN 38* 39*  CREATININE 0.77 0.59*  CALCIUM 7.5* 7.8*   Liver Function Tests: Recent Labs  Lab 07/05/22 0728  AST 22  ALT 14  ALKPHOS 57  BILITOT 0.4  PROT 4.7*  ALBUMIN 2.4*   Recent Labs  Lab 07/05/22 0728  LIPASE 37   No results for input(s): "AMMONIA" in the last 168 hours. CBC: Recent Labs  Lab 07/05/22 0728 07/05/22 1416 07/05/22 2008 07/06/22 0222 07/06/22 1616 07/06/22 1654 07/07/22 0408  WBC 14.7* 10.4 9.8 10.4  --   --   --   NEUTROABS 12.2*  --   --   --   --    --   --   HGB 6.5* 8.8* 8.5* 8.1* 7.4* 7.3* 8.4*  HCT 21.1* 26.5* 26.2* 24.8* 22.9* 22.6* 25.5*  MCV 95.0 88.0 88.2 88.3  --   --   --   PLT 321 236 229 224  --   --   --    Cardiac Enzymes: No results for input(s): "CKTOTAL", "CKMB", "CKMBINDEX", "TROPONINI" in the last 168 hours. BNP: Invalid input(s): "POCBNP" CBG: Recent Labs  Lab 07/05/22 1156 07/05/22 1958  GLUCAP 154* 108*   D-Dimer No results for input(s): "DDIMER" in the last 72 hours. Hgb A1c No results for input(s): "HGBA1C" in the last 72 hours. Lipid Profile No results for input(s): "CHOL", "HDL", "LDLCALC", "TRIG", "CHOLHDL", "LDLDIRECT" in the last 72 hours. Thyroid function studies No results for input(s): "TSH", "T4TOTAL", "T3FREE", "THYROIDAB" in the last 72 hours.  Invalid input(s): "FREET3" Anemia work up No results for input(s): "VITAMINB12", "FOLATE", "FERRITIN", "TIBC", "IRON", "RETICCTPCT" in the last 72 hours. Urinalysis    Component Value Date/Time   COLORURINE YELLOW (A) 05/18/2022 2135   APPEARANCEUR HAZY (A) 05/18/2022 2135   LABSPEC >1.046 (H) 05/18/2022 2135  PHURINE 5.0 05/18/2022 2135   GLUCOSEU NEGATIVE 05/18/2022 2135   HGBUR LARGE (A) 05/18/2022 2135   BILIRUBINUR NEGATIVE 05/18/2022 2135   [place] NEGATIVE 05/18/2022 2135   PROTEINUR 100 (A) 05/18/2022 2135   NITRITE NEGATIVE 05/18/2022 2135   LEUKOCYTESUR NEGATIVE 05/18/2022 2135   Sepsis Labs Recent Labs  Lab 07/05/22 0728 07/05/22 1416 07/05/22 2008 07/06/22 0222  WBC 14.7* 10.4 9.8 10.4   Microbiology Recent Results (from the past 240 hour(s))  MRSA Next Gen by PCR, Nasal     Status: None   Collection Time: 07/05/22 11:55 AM   Specimen: Nasal Mucosa; Nasal Swab  Result Value Ref Range Status   MRSA by PCR Next Gen NOT DETECTED NOT DETECTED Final    Comment: (NOTE) The GeneXpert MRSA Assay (FDA approved for NASAL specimens only), is one component of a comprehensive MRSA colonization surveillance program. It is  not intended to diagnose MRSA infection nor to guide or monitor treatment for MRSA infections. Test performance is not FDA approved in patients less than 46 years old. Performed at [facility], [address] [place], [place] [address]      Total time spend on discharging this patient, including the last patient exam, discussing the hospital stay, instructions for ongoing care as it relates to all pertinent caregivers, as well as preparing the medical discharge records, prescriptions, and/or referrals as applicable, is 30 minutes.    Enzo Bi, MD  [facility] 07/07/2022, 7:30 AM

## 2022-07-10 DIAGNOSIS — C155 Malignant neoplasm of lower third of esophagus: Secondary | ICD-10-CM | POA: Insufficient documentation

## 2022-07-13 ENCOUNTER — Ambulatory Visit: Payer: Medicare HMO

## 2022-07-14 ENCOUNTER — Ambulatory Visit: Payer: Medicare HMO

## 2022-07-15 ENCOUNTER — Ambulatory Visit: Payer: Medicare HMO

## 2022-07-18 ENCOUNTER — Ambulatory Visit: Payer: Medicare HMO

## 2022-07-19 ENCOUNTER — Ambulatory Visit: Payer: Medicare HMO

## 2022-07-20 ENCOUNTER — Ambulatory Visit: Payer: Medicare HMO

## 2022-07-21 ENCOUNTER — Ambulatory Visit: Payer: Medicare HMO

## 2022-07-22 ENCOUNTER — Ambulatory Visit: Payer: Medicare HMO

## 2022-07-25 ENCOUNTER — Ambulatory Visit: Payer: Medicare HMO

## 2022-07-26 ENCOUNTER — Ambulatory Visit: Payer: Medicare HMO

## 2022-07-27 ENCOUNTER — Ambulatory Visit: Payer: Medicare HMO

## 2022-07-28 ENCOUNTER — Ambulatory Visit: Payer: Medicare HMO

## 2022-07-29 ENCOUNTER — Ambulatory Visit: Payer: Medicare HMO

## 2022-08-01 ENCOUNTER — Ambulatory Visit: Payer: Medicare HMO

## 2022-08-02 ENCOUNTER — Ambulatory Visit: Payer: Medicare HMO

## 2022-08-03 ENCOUNTER — Ambulatory Visit: Payer: Medicare HMO

## 2022-08-04 ENCOUNTER — Ambulatory Visit: Payer: Medicare HMO

## 2022-08-05 ENCOUNTER — Ambulatory Visit: Payer: Medicare HMO

## 2022-08-08 ENCOUNTER — Ambulatory Visit: Payer: Medicare HMO

## 2022-08-09 ENCOUNTER — Ambulatory Visit: Payer: Medicare HMO

## 2022-08-10 ENCOUNTER — Ambulatory Visit: Payer: Medicare HMO

## 2024-03-09 DEATH — deceased
